# Patient Record
Sex: Female | Born: 1970 | Race: White | Hispanic: No | Marital: Married | Smoking: Never smoker
Health system: Southern US, Community
[De-identification: ages and names within clinical notes are randomized; demographics above are authoritative.]

## PROBLEM LIST (undated history)

## (undated) DIAGNOSIS — E785 Hyperlipidemia, unspecified: Secondary | ICD-10-CM

## (undated) DIAGNOSIS — M25512 Pain in left shoulder: Secondary | ICD-10-CM

## (undated) DIAGNOSIS — L709 Acne, unspecified: Secondary | ICD-10-CM

## (undated) DIAGNOSIS — R0982 Postnasal drip: Secondary | ICD-10-CM

## (undated) DIAGNOSIS — M6530 Trigger finger, unspecified finger: Secondary | ICD-10-CM

## (undated) DIAGNOSIS — E11319 Type 2 diabetes mellitus with unspecified diabetic retinopathy without macular edema: Secondary | ICD-10-CM

## (undated) DIAGNOSIS — D649 Anemia, unspecified: Secondary | ICD-10-CM

## (undated) DIAGNOSIS — N979 Female infertility, unspecified: Secondary | ICD-10-CM

## (undated) DIAGNOSIS — R0981 Nasal congestion: Secondary | ICD-10-CM

## (undated) DIAGNOSIS — F329 Major depressive disorder, single episode, unspecified: Secondary | ICD-10-CM

## (undated) DIAGNOSIS — I10 Essential (primary) hypertension: Secondary | ICD-10-CM

## (undated) DIAGNOSIS — M25511 Pain in right shoulder: Secondary | ICD-10-CM

## (undated) DIAGNOSIS — K219 Gastro-esophageal reflux disease without esophagitis: Secondary | ICD-10-CM

## (undated) DIAGNOSIS — E109 Type 1 diabetes mellitus without complications: Secondary | ICD-10-CM

## (undated) DIAGNOSIS — R05 Cough: Secondary | ICD-10-CM

## (undated) DIAGNOSIS — E1140 Type 2 diabetes mellitus with diabetic neuropathy, unspecified: Secondary | ICD-10-CM

## (undated) DIAGNOSIS — F32A Depression, unspecified: Secondary | ICD-10-CM

## (undated) HISTORY — DX: Type 2 diabetes mellitus with unspecified diabetic retinopathy without macular edema: E11.319

## (undated) HISTORY — DX: Hyperlipidemia, unspecified: E78.5

## (undated) HISTORY — DX: Female infertility, unspecified: N97.9

## (undated) HISTORY — DX: Type 1 diabetes mellitus without complications: E10.9

## (undated) HISTORY — DX: Pain in right shoulder: M25.511

## (undated) HISTORY — DX: Gastro-esophageal reflux disease without esophagitis: K21.9

## (undated) HISTORY — DX: Major depressive disorder, single episode, unspecified: F32.9

## (undated) HISTORY — DX: Essential (primary) hypertension: I10

## (undated) HISTORY — DX: Pain in left shoulder: M25.512

## (undated) HISTORY — DX: Depression, unspecified: F32.A

---

## 2013-06-29 HISTORY — PX: SHOULDER SURGERY: SHX246

## 2014-10-01 ENCOUNTER — Other Ambulatory Visit: Payer: Self-pay | Admitting: Internal Medicine

## 2014-10-01 DIAGNOSIS — E049 Nontoxic goiter, unspecified: Secondary | ICD-10-CM

## 2014-10-06 ENCOUNTER — Other Ambulatory Visit: Payer: Self-pay

## 2014-10-07 ENCOUNTER — Ambulatory Visit
Admission: RE | Admit: 2014-10-07 | Discharge: 2014-10-07 | Disposition: A | Discharge status: Home / Self Care | Payer: BC Managed Care – PPO | Source: Ambulatory Visit | Attending: Internal Medicine | Admitting: Internal Medicine

## 2014-10-07 DIAGNOSIS — E049 Nontoxic goiter, unspecified: Secondary | ICD-10-CM

## 2015-04-08 LAB — HEMOGLOBIN A1C: Hemoglobin A1C: 9.5

## 2015-07-22 LAB — CBC AND DIFFERENTIAL
HEMATOCRIT: 37 % (ref 36–46)
HEMOGLOBIN: 12.1 g/dL (ref 12.0–16.0)
Platelets: 436 10*3/uL — AB (ref 150–399)
WBC: 8.2 10^3/mL

## 2015-07-22 LAB — BASIC METABOLIC PANEL
BUN: 11 mg/dL (ref 4–21)
Creatinine: 0.9 mg/dL (ref <=1.1)
Glucose: 286 mg/dL
Potassium: 3.8 mmol/L (ref 3.4–5.3)
Sodium: 135 mmol/L — AB (ref 137–147)

## 2015-08-16 ENCOUNTER — Encounter: Payer: Self-pay | Admitting: Endocrinology

## 2015-08-16 ENCOUNTER — Ambulatory Visit (INDEPENDENT_AMBULATORY_CARE_PROVIDER_SITE_OTHER): Payer: BLUE CROSS/BLUE SHIELD | Admitting: Endocrinology

## 2015-08-16 ENCOUNTER — Encounter: Payer: BLUE CROSS/BLUE SHIELD | Attending: Endocrinology | Admitting: Nutrition

## 2015-08-16 VITALS — BP 132/86 | HR 87 | Temp 98.3°F | Ht 69.0 in | Wt 209.0 lb

## 2015-08-16 DIAGNOSIS — E109 Type 1 diabetes mellitus without complications: Secondary | ICD-10-CM | POA: Insufficient documentation

## 2015-08-16 DIAGNOSIS — Z0279 Encounter for issue of other medical certificate: Secondary | ICD-10-CM

## 2015-08-16 DIAGNOSIS — K219 Gastro-esophageal reflux disease without esophagitis: Secondary | ICD-10-CM

## 2015-08-16 DIAGNOSIS — Z713 Dietary counseling and surveillance: Secondary | ICD-10-CM | POA: Diagnosis not present

## 2015-08-16 DIAGNOSIS — E785 Hyperlipidemia, unspecified: Secondary | ICD-10-CM

## 2015-08-16 DIAGNOSIS — E10339 Type 1 diabetes mellitus with moderate nonproliferative diabetic retinopathy without macular edema: Secondary | ICD-10-CM | POA: Diagnosis not present

## 2015-08-16 DIAGNOSIS — E119 Type 2 diabetes mellitus without complications: Secondary | ICD-10-CM | POA: Insufficient documentation

## 2015-08-16 DIAGNOSIS — D509 Iron deficiency anemia, unspecified: Secondary | ICD-10-CM | POA: Diagnosis not present

## 2015-08-16 NOTE — Progress Notes (Signed)
Pt is on a Medtronic 530G insulin pump.  She is counting carbs and using the bolus wizard as directed.  She did not know how to adjust/change the basal rates, nor change the I/C ratio for meal time insulin.  She was shown how to do this, and she did this with some assistance from me.   She reports that she is not using the CGM, because the "alarms were going off all the time", and the readings were way off from the blood sugars readings".   She is having problems with iron deficiency anemia, and is tired all the time.  She says that once she gets this problem solved, she will be better able to deal with the CGM.   We discussed how the changes that were made to the pump( Basal rate changed from 1.2 to 1.0u/hr, and the I/C ratio changed from 1u/8 to 1u/7 ) will affect her blood sugars, she had no questions.  She was told to call me in 1 week with blood sugar readings.  She did not want any more information on carb counting, saying that she has a good understanding of this.   She had no final questions.

## 2015-08-16 NOTE — Progress Notes (Deleted)
Patient is on a Medtronic 530 g insulin pump.  She is counting carbs,  But does not know how to make changes in her basal rate and carb amounts.  She was shown how to do this and decreased her basal rate from 1.2 to 1.0u/hr.  She increased her I/C ratio from 1u/8 to 1u/7.  We reviewed what these changes mean, and how this will affect her blood sugar levels.  She will call me in one week with blood sugar readings.

## 2015-08-16 NOTE — Progress Notes (Signed)
Subjective:    Patient ID: Cynthia Moody, female    DOB: 24-Dec-1971, 44 y.o.   MRN: [id number]  HPI pt states DM was dx'ed in 1998; she has mild neuropathy of the lower extremities, and associated retinopathy; she has been on insulin since 2000; pt says her diet and exercise are not very good; she has never had GDM, pancreatitis, or severe hypoglycemia.  She has had DKA once, in 2012.  She takes these settings.  basal rate varies between 1.1 and 1.2 units/hr. mealtime bolus of 1 unit/8 grams carbohydrate continue correction bolus (which some people call "sensitivity," or "insulin sensitivity ratio," or just "isr") of 1 unit for each 35 by which glucose exceeds 120. She had a continuous glucose monitor, but stopped, because she did not like it.  She averages approx 65 units total per day.  She seldom has hypoglycemia, and these episodes are mild.  It most often happens in the early hrs of the am.  Meter is downloaded today, and reviewed.  It is scanned into the record.  She checks cbg only approx twice per day. No past medical history on file.  No past surgical history on file.  Social History   Social History  . Marital Status: Married    Spouse Name: N/A  . Number of Children: N/A  . Years of Education: N/A   Occupational History  . Not on file.   Social History Main Topics  . Smoking status: Never Smoker   . Smokeless tobacco: Not on file  . Alcohol Use: 0.0 oz/week    0 Standard drinks or equivalent per week  . Drug Use: Not on file  . Sexual Activity: Not on file   Other Topics Concern  . Not on file   Social History Narrative  . No narrative on file    No current outpatient prescriptions on file prior to visit.   No current facility-administered medications on file prior to visit.    Allergies  Allergen Reactions  . Lipitor [Atorvastatin] Swelling    Family History  Problem Relation Age of Onset  . Diabetes Mother     BP 132/86 mmHg  Pulse 87  Temp(Src)  98.3 F (36.8 C) (Oral)  Ht 5\' 9"  (1.753 m)  Wt 209 lb (94.802 kg)  BMI 30.85 kg/m2  SpO2 98%  LMP 08/07/2015  Review of Systems denies weight loss, headache, chest pain, sob, n/v, urinary frequency, muscle cramps, excessive diaphoresis, depression, rhinorrhea, and easy bruising.  she has intermittent blurry vision, cold intolerance, and fatigue.      Objective:   Physical Exam VS: see vs page GEN: no distress HEAD: head: no deformity eyes: no periorbital swelling, no proptosis external nose and ears are normal mouth: no lesion seen NECK: supple, thyroid is not enlarged CHEST WALL: no deformity LUNGS:  Clear to auscultation. CV: reg rate and rhythm, no murmur ABD: abdomen is soft, nontender.  no hepatosplenomegaly.  not distended.  no hernia MUSCULOSKELETAL: muscle bulk and strength are grossly normal.  no obvious joint swelling.  gait is normal and steady EXTEMITIES: no deformity.  no ulcer on the feet.  feet are of normal color and temp.  no edema PULSES: dorsalis pedis intact bilat.  no carotid bruit NEURO:  cn 2-12 grossly intact.   readily moves all 4's.  sensation is intact to touch on the feet SKIN:  Normal texture and temperature.  No rash or suspicious lesion is visible.   NODES:  None palpable at the  neck PSYCH: alert, well-oriented.  Does not appear anxious nor depressed.   outside test results are reviewed: A1c=9.5%  i have reviewed outside records: Pt was seen with sxs of fatigue, and was noted to have anemia.   Radiol: thyroid US (2015): Small 5 mm hypoechoic nodule in the right lower pole.      Assessment & Plan:  DM: severe exacerbation Fatigue: new to me.  Could be due to anemia or elevated glucose.  We'll follow. Thyroid nodule: nonpalpable. She just needs annual physical exam of the thyroid.  Patient is advised the following: Patient Instructions  good diet and exercise significantly improve the control of your diabetes.  please let me know if you  wish to be referred to a dietician.  high blood sugar is very risky to your health.  you should see an eye doctor and dentist every year.  It is very important to get all recommended vaccinations.  controlling your blood pressure and cholesterol drastically reduces the damage diabetes does to your body.  Those who smoke should quit.  please discuss these with your doctor.  At our office, we are fortunate to have two specialists who are happy to help you:   Leonia Reader, RN, CDE, is a diabetes educator and pump trainer.  She is here on Monday mornings, and all day Tuesday and Wednesday.  She is can help you with low blood sugar avoidance and treatment, injecting insulin, sick day management, and others.   Antonieta Iba, RD is our dietician.  She is here all day Thursday and Friday.  She can advise you about a healthy diet.  She can also help you about a variety of special diabetes situations, such as shift work, Actor, gluten-free, diet for kidney patients, traveling with diabetes, and help for those who need to gain weight.   check your blood sugar 5 times a day: before the 3 meals, at bedtime, and as needed.  also check if you have symptoms of your blood sugar being too high or too low.  please keep a record of the readings and bring it to your next appointment here.  You can write it on any piece of paper.  please call us sooner if your blood sugar goes below 70, or if you have a lot of readings over 200. For now: Reduce your basal rate to 1 unit/hr, 24 hrs per day.  increase mealtime bolus to 1 unit/7 grams carbohydrate continue correction bolus (which some people call "sensitivity," or "insulin sensitivity ratio," or just "isr") of 1 unit for each 35 by which your glucose exceeds 120. Please come back for a follow-up appointment in 2 months.

## 2015-08-16 NOTE — Patient Instructions (Addendum)
good diet and exercise significantly improve the control of your diabetes.  please let me know if you wish to be referred to a dietician.  high blood sugar is very risky to your health.  you should see an eye doctor and dentist every year.  It is very important to get all recommended vaccinations.  controlling your blood pressure and cholesterol drastically reduces the damage diabetes does to your body.  Those who smoke should quit.  please discuss these with your doctor.  At our office, we are fortunate to have two specialists who are happy to help you:   Leonia Reader, RN, CDE, is a diabetes educator and pump trainer.  She is here on Monday mornings, and all day Tuesday and Wednesday.  She is can help you with low blood sugar avoidance and treatment, injecting insulin, sick day management, and others.   Antonieta Iba, RD is our dietician.  She is here all day Thursday and Friday.  She can advise you about a healthy diet.  She can also help you about a variety of special diabetes situations, such as shift work, Actor, gluten-free, diet for kidney patients, traveling with diabetes, and help for those who need to gain weight.   check your blood sugar 5 times a day: before the 3 meals, at bedtime, and as needed.  also check if you have symptoms of your blood sugar being too high or too low.  please keep a record of the readings and bring it to your next appointment here.  You can write it on any piece of paper.  please call us sooner if your blood sugar goes below 70, or if you have a lot of readings over 200. For now: Reduce your basal rate to 1 unit/hr, 24 hrs per day.  increase mealtime bolus to 1 unit/7 grams carbohydrate continue correction bolus (which some people call "sensitivity," or "insulin sensitivity ratio," or just "isr") of 1 unit for each 35 by which your glucose exceeds 120. Please come back for a follow-up appointment in 2 months.

## 2015-08-17 ENCOUNTER — Telehealth: Payer: Self-pay | Admitting: Hematology

## 2015-08-17 NOTE — Telephone Encounter (Signed)
Called pt and left message on 08/16/15.    Dx: Low iron levels Referring: Dr. Kelton Pillar

## 2015-08-25 ENCOUNTER — Ambulatory Visit: Payer: BLUE CROSS/BLUE SHIELD | Admitting: Dietician

## 2015-08-25 ENCOUNTER — Other Ambulatory Visit (HOSPITAL_BASED_OUTPATIENT_CLINIC_OR_DEPARTMENT_OTHER): Payer: BLUE CROSS/BLUE SHIELD

## 2015-08-25 ENCOUNTER — Other Ambulatory Visit: Payer: Self-pay | Admitting: Hematology

## 2015-08-25 ENCOUNTER — Telehealth: Payer: Self-pay | Admitting: Hematology

## 2015-08-25 ENCOUNTER — Encounter: Payer: Self-pay | Admitting: Hematology

## 2015-08-25 ENCOUNTER — Other Ambulatory Visit: Payer: BLUE CROSS/BLUE SHIELD

## 2015-08-25 ENCOUNTER — Ambulatory Visit (HOSPITAL_BASED_OUTPATIENT_CLINIC_OR_DEPARTMENT_OTHER): Payer: BLUE CROSS/BLUE SHIELD | Admitting: Hematology

## 2015-08-25 VITALS — BP 132/78 | HR 95 | Temp 98.5°F | Resp 18 | Ht 69.0 in | Wt 206.8 lb

## 2015-08-25 DIAGNOSIS — K219 Gastro-esophageal reflux disease without esophagitis: Secondary | ICD-10-CM | POA: Diagnosis not present

## 2015-08-25 DIAGNOSIS — D539 Nutritional anemia, unspecified: Secondary | ICD-10-CM

## 2015-08-25 DIAGNOSIS — D509 Iron deficiency anemia, unspecified: Secondary | ICD-10-CM

## 2015-08-25 DIAGNOSIS — E010 Iodine-deficiency related diffuse (endemic) goiter: Secondary | ICD-10-CM

## 2015-08-25 DIAGNOSIS — R1312 Dysphagia, oropharyngeal phase: Secondary | ICD-10-CM

## 2015-08-25 LAB — CBC & DIFF AND RETIC
BASO%: 0.3 % (ref 0.0–2.0)
BASOS ABS: 0 10*3/uL (ref 0.0–0.1)
EOS ABS: 0.1 10*3/uL (ref 0.0–0.5)
EOS%: 1.5 % (ref 0.0–7.0)
HEMATOCRIT: 37.3 % (ref 34.8–46.6)
HEMOGLOBIN: 12.3 g/dL (ref 11.6–15.9)
IMMATURE RETIC FRACT: 4 % (ref 1.60–10.00)
LYMPH%: 30.8 % (ref 14.0–49.7)
MCH: 27 pg (ref 25.1–34.0)
MCHC: 33 g/dL (ref 31.5–36.0)
MCV: 81.8 fL (ref 79.5–101.0)
MONO#: 0.6 10*3/uL (ref 0.1–0.9)
MONO%: 7.9 % (ref 0.0–14.0)
NEUT%: 59.5 % (ref 38.4–76.8)
NEUTROS ABS: 4.4 10*3/uL (ref 1.5–6.5)
Platelets: 393 10*3/uL (ref 145–400)
RBC: 4.56 10*6/uL (ref 3.70–5.45)
RDW: 14.8 % — AB (ref 11.2–14.5)
RETIC %: 1.32 % (ref 0.70–2.10)
Retic Ct Abs: 60.19 10*3/uL (ref 33.70–90.70)
WBC: 7.3 10*3/uL (ref 3.9–10.3)
lymph#: 2.3 10*3/uL (ref 0.9–3.3)

## 2015-08-25 LAB — COMPREHENSIVE METABOLIC PANEL (CC13)
ALBUMIN: 3.9 g/dL (ref 3.5–5.0)
ALK PHOS: 84 U/L (ref 40–150)
ALT: 11 U/L (ref 0–55)
AST: 14 U/L (ref 5–34)
Anion Gap: 8 mEq/L (ref 3–11)
BILIRUBIN TOTAL: 0.32 mg/dL (ref 0.20–1.20)
BUN: 10.9 mg/dL (ref 7.0–26.0)
CALCIUM: 9.7 mg/dL (ref 8.4–10.4)
CO2: 27 mEq/L (ref 22–29)
Chloride: 105 mEq/L (ref 98–109)
Creatinine: 0.9 mg/dL (ref 0.6–1.1)
EGFR: 76 mL/min/{1.73_m2} — ABNORMAL LOW (ref >90)
GLUCOSE: 135 mg/dL (ref 70–140)
Potassium: 4.1 mEq/L (ref 3.5–5.1)
SODIUM: 140 meq/L (ref 136–145)
TOTAL PROTEIN: 7.2 g/dL (ref 6.4–8.3)

## 2015-08-25 LAB — LACTATE DEHYDROGENASE (CC13): LDH: 179 U/L (ref 125–245)

## 2015-08-25 LAB — CHCC SMEAR

## 2015-08-25 NOTE — Telephone Encounter (Signed)
per per tohave labs drwn prior to appt-cld & spoke to pt nd adv to be here @ 2:15 to have labs drawn-pt understood

## 2015-08-26 LAB — IRON AND TIBC CHCC
%SAT: 7 % — AB (ref 21–57)
IRON: 27 ug/dL — AB (ref 41–142)
TIBC: 369 ug/dL (ref 236–444)
UIBC: 342 ug/dL (ref 120–384)

## 2015-08-26 LAB — VITAMIN B12: Vitamin B-12: 546 pg/mL (ref 211–911)

## 2015-08-26 LAB — HAPTOGLOBIN: Haptoglobin: 227 mg/dL — ABNORMAL HIGH (ref 43–212)

## 2015-08-26 LAB — FERRITIN CHCC: FERRITIN: 8 ng/mL — AB (ref 9–269)

## 2015-08-30 ENCOUNTER — Telehealth: Payer: Self-pay | Admitting: *Deleted

## 2015-08-30 ENCOUNTER — Encounter: Payer: Self-pay | Admitting: Hematology

## 2015-08-30 NOTE — Telephone Encounter (Signed)
per pof to sch pt appt-gave pt copy of avs-sent MW email per 2nd pof to sch fera-will all pt once reply

## 2015-08-30 NOTE — Progress Notes (Signed)
Cynthia Moody Kitchen    HEMATOLOGY/ONCOLOGY CONSULTATION NOTE  Date of Service: 08/30/2015  Patient Care Team: Lottie Dawson, MD as PCP - General (Internal Medicine)  CHIEF COMPLAINTS/PURPOSE OF CONSULTATION:  Fatigue/management of iron deficiency  HISTORY OF PRESENTING ILLNESS:   Cynthia Moody is a wonderful 44 y.o. female who has been referred to [place] by Dr .Kelton Pillar, Herschell Dimes, MD for evaluation and management of iron deficiency.  Ayrianna has a history of late onset diabetes mellitus type 1 diagnosis in 1998 which she is managing with an insulin pump, history of dyslipidemia, depression which is well-controlled.  She notes that she is previously had a history of iron deficiency anemia that was evaluated and treated at the [facility] in 2011. She notes that she had an EGD and a colonoscopy which was unrevealing and they thought about it but decided not to do a capsule endoscopy. She did not tolerate oral iron due to significant GI distress and was treated with 2-3 doses of IV iron. She notes that the last time she got IV iron was a few years ago.  She notes no overt evidence of GI bleeding, epistaxis. Has been having somewhat heavy but regular periods. There is last about 5 days and she does pass some clots and a 2 and a 3.  She notes that she's been having increasing fatigue and restless legs. No pica symptoms. Since she could not tolerate oral iron she was referred by her primary care physician to consider IV iron treatment if indicated. Patient reports that she has been having to drag herself to work and the fatigue is significantly affecting her quality of life. She reports that she cannot really tolerated the oral ferrous sulfate or other iron preparations even once a day.  Her labs today showed a hemoglobin of 12.3 with an MCV of 81 WBC count of 7.3 and platelets of 393. CMP was within normal limits. Ferritin was significantly reduced at 8 with an iron saturation of 7%.  Clearly suggesting iron deficiency. B12 was normal at 546. Normal bilirubin, LDH and haptoglobin not suggesting any signs of hemolysis.  Patient has been on chronic PPI treatment for several years and was explained at this could be a risk factor for both iron and B12 deficiency. She reports that she has significant GERD and cannot stop using PPIs at this time.  Bowel habits have been regular. No overt signs of an intolerance or malabsorption syndrome.  MEDICAL HISTORY:  Past Medical History  Diagnosis Date  . Diabetes mellitus type 1 1998    Currently being treated with an insulin pump  . Dyslipidemia   . Shoulder pain, bilateral     Patient reports this was related to bone spurs that significantly limited her range of motion . Left shoulder is better after surgery.  Cynthia Moody Kitchen Heart murmur   . Abducent nerve palsy, left eye     Thought to be related to her diabetes  . GERD (gastroesophageal reflux disease)   . Depression   . Diabetic retinopathy     SURGICAL HISTORY: Past Surgical History  Procedure Laterality Date  . Cesarean section  1996  . Shoulder surgery Left     For bone spurs    SOCIAL HISTORY: Social History   Social History  . Marital Status: Married    Spouse Name: N/A  . Number of Children: N/A  . Years of Education: N/A   Occupational History  . Not on file.   Social History Main Topics  .  Smoking status: Never Smoker   . Smokeless tobacco: Not on file  . Alcohol Use: 0.0 oz/week    0 Standard drinks or equivalent per week  . Drug Use: Not on file  . Sexual Activity: Yes   Other Topics Concern  . Not on file   Social History Narrative    FAMILY HISTORY: Family History  Problem Relation Age of Onset  . Diabetes Mother     ALLERGIES:  is allergic to lipitor.  MEDICATIONS:  Current Outpatient Prescriptions  Medication Sig Dispense Refill  . acetaminophen (TYLENOL) 650 MG suppository Place 650 mg rectally every 4 (four) hours as needed.    .  cholecalciferol (VITAMIN D) 1000 UNITS tablet Take 1,000 Units by mouth daily.    Cynthia Moody Kitchen glucosamine-chondroitin 500-400 MG tablet Take 1 tablet by mouth 3 (three) times daily.    . insulin aspart (NOVOLOG) 100 UNIT/ML injection Inject into the skin. 1 unit per 8 grams of carbs    . Insulin Infusion Pump Supplies (PARADIGM QUICK-SET 32" 6MM) MISC 1 Device by Does not apply route every 3 (three) days.    . Insulin Infusion Pump Supplies (PARADIGM RESERVOIR 3ML) MISC 1 Device by Does not apply route every 3 (three) days.    Cynthia Moody Kitchen omeprazole (PRILOSEC) 20 MG capsule Take 20 mg by mouth daily.    . simvastatin (ZOCOR) 20 MG tablet Take 20 mg by mouth daily.    . traMADol (ULTRAM) 50 MG tablet Take by mouth every 6 (six) hours as needed.     No current facility-administered medications for this visit.    REVIEW OF SYSTEMS:    10 Point review of Systems was done is negative except as noted above.  PHYSICAL EXAMINATION: ECOG PERFORMANCE STATUS: 1 - Symptomatic but completely ambulatory  . Filed Vitals:   08/25/15 1511  Height: 5\' 9"  (1.753 m)  Weight: 206 lb 12.8 oz (93.804 kg)   Filed Weights   08/25/15 1511  Weight: 206 lb 12.8 oz (93.804 kg)   .Body mass index is 30.53 kg/(m^2).  GENERAL:alert, in no acute distress and comfortable SKIN: skin color, texture, turgor are normal, no rashes or significant lesions EYES: normal, conjunctiva are pink and non-injected, sclera clear OROPHARYNX:no exudate, no erythema and lips, buccal mucosa, and tongue normal  NECK: supple, no JVD, thyroid normal size, non-tender, without nodularity LYMPH:  no palpable lymphadenopathy in the cervical, axillary or inguinal LUNGS: clear to auscultation with normal respiratory effort HEART: regular rate & rhythm,  no murmurs and no lower extremity edema ABDOMEN: abdomen soft, non-tender, normoactive bowel sounds  Musculoskeletal: no cyanosis of digits and no clubbing  PSYCH: alert & oriented x 3 with fluent  speech NEURO: no focal motor/sensory deficits  LABORATORY DATA:  I have reviewed the data as listed  . CBC Latest Ref Rng 08/25/2015  WBC 3.9 - 10.3 10e3/uL 7.3  Hemoglobin 11.6 - 15.9 g/dL 12.3  Hematocrit 34.8 - 46.6 % 37.3  Platelets 145 - 400 10e3/uL 393    . CMP Latest Ref Rng 08/25/2015  Glucose 70 - 140 mg/dl 135  BUN 7.0 - 26.0 mg/dL 10.9  Creatinine 0.6 - 1.1 mg/dL 0.9  Sodium 136 - 145 mEq/L 140  Potassium 3.5 - 5.1 mEq/L 4.1  CO2 22 - 29 mEq/L 27  Calcium 8.4 - 10.4 mg/dL 9.7  Total Protein 6.4 - 8.3 g/dL 7.2  Total Bilirubin 0.20 - 1.20 mg/dL 0.32  Alkaline Phos 40 - 150 U/L 84  AST 5 - 34 U/L  14  ALT 0 - 55 U/L 11   . Lab Results  Component Value Date   IRON 27* 08/25/2015   TIBC 369 08/25/2015   IRONPCTSAT 7* 08/25/2015   (Iron and TIBC)  Lab Results  Component Value Date   FERRITIN 8* 08/25/2015    . Lab Results  Component Value Date   LDH 179 08/25/2015   Peripheral blood smear (08/25/2015) personally reviewed by me.  RBCs showing changes of microcytosis with the population for a tendency to hypochromia. Normal platelets but no platelet clumping. No overt dysplastic changes. No blasts.     RADIOGRAPHIC STUDIES: I have personally reviewed the radiological images as listed and agreed with the findings in the report. No results found.  ASSESSMENT & PLAN:   44 year old Caucasian female with increasing fatigue noted to have   #1 Severe iron deficiency with minimal anemia. Ferritin 8, iron saturation 7%. Patient appears to have significant symptomatic fatigue. Has had extensive GI workup to try to determine etiology of iron deficiency at [facility] that was unrevealing. Has previously received and tolerated IV Feraheme. She notes that she is unable to tolerate any oral iron due to significant GI distress and is keen take replace her Iron IV. Chronic PPI therapy is certainly an additional risk factor for developing iron  deficiency. Plan  -Consented the patient and will proceed to replace her iron with IV Feraheme 510 mg IV every weekly 2 doses -Patient will take cetirizine and famotidine tablets prior to each infusion of IV iron to reduce the risk of allergic reactions. -She will return to care in 8 weeks for repeat CBC, ferritin, iron profile with Dr. Irene Limbo. -Primary care physician to consider GYN referral to evaluate heavier menstrual bleeding. -Consider additional lifestyle modifications for GERD to be able to wean off PPI therapy if possible.  All of the patient's questions were answered in detail to her apparent satisfaction. She understands the plan and is agreeable to proceeding with this. She has all the contact information for our office in case any other questions or concerns arise.  I spent 30 minutes counseling the patient face to face. The total time spent in the appointment was 40 minutes and more than 50% was on counseling and direct patient cares.    Sullivan Lone MD [place] AAHIVMS [facility] Hematology/Oncology Physician [facility]  (Office):       [phone] (Work cell):  [phone] (Fax):           [phone]  08/30/2015 8:38 AM

## 2015-08-30 NOTE — Telephone Encounter (Signed)
Per staff message and POF I have scheduled appts. Advised scheduler of appts. JMW  

## 2015-09-02 ENCOUNTER — Encounter: Payer: Self-pay | Admitting: Hematology

## 2015-09-02 ENCOUNTER — Ambulatory Visit (HOSPITAL_COMMUNITY)
Admission: RE | Admit: 2015-09-02 | Discharge: 2015-09-02 | Disposition: A | Discharge status: Home / Self Care | Payer: BLUE CROSS/BLUE SHIELD | Source: Ambulatory Visit | Attending: Hematology | Admitting: Hematology

## 2015-09-02 DIAGNOSIS — R1312 Dysphagia, oropharyngeal phase: Secondary | ICD-10-CM | POA: Insufficient documentation

## 2015-09-02 DIAGNOSIS — E049 Nontoxic goiter, unspecified: Secondary | ICD-10-CM | POA: Diagnosis present

## 2015-09-02 DIAGNOSIS — E010 Iodine-deficiency related diffuse (endemic) goiter: Secondary | ICD-10-CM

## 2015-09-02 MED ORDER — IOHEXOL 300 MG/ML  SOLN
100.0000 mL | Freq: Once | INTRAMUSCULAR | Status: AC | PRN
  Administered 2015-09-02: 100 mL via INTRAVENOUS

## 2015-09-02 NOTE — Progress Notes (Signed)
Spoke to pt introducing myself as her FA and informed her of my services.  I gave her my contact information to contact me if she needs further assistance.  She thanked me but right now she doesn't have any financial needs.

## 2015-09-05 ENCOUNTER — Telehealth: Payer: Self-pay | Admitting: Endocrinology

## 2015-09-05 MED ORDER — INSULIN ASPART 100 UNIT/ML ~~LOC~~ SOLN: SUBCUTANEOUS | Status: DC

## 2015-09-05 NOTE — Telephone Encounter (Signed)
Pt switched pharmacy to [facility] on [address], please send all refills and meds there

## 2015-09-05 NOTE — Telephone Encounter (Signed)
Pharmacy changed and insulin sent.

## 2015-09-06 ENCOUNTER — Telehealth: Payer: Self-pay | Admitting: Hematology

## 2015-09-06 NOTE — Telephone Encounter (Signed)
cld pt and adv of time & date of fera-pt understood

## 2015-09-07 ENCOUNTER — Ambulatory Visit (HOSPITAL_BASED_OUTPATIENT_CLINIC_OR_DEPARTMENT_OTHER): Payer: BLUE CROSS/BLUE SHIELD

## 2015-09-07 VITALS — BP 120/68 | HR 67 | Temp 98.2°F | Resp 18

## 2015-09-07 DIAGNOSIS — D509 Iron deficiency anemia, unspecified: Secondary | ICD-10-CM | POA: Diagnosis not present

## 2015-09-07 MED ORDER — SODIUM CHLORIDE 0.9 % IV SOLN
Freq: Once | INTRAVENOUS | Status: AC
  Administered 2015-09-07: 12:00:00 via INTRAVENOUS

## 2015-09-07 MED ORDER — SODIUM CHLORIDE 0.9 % IV SOLN
510.0000 mg | Freq: Once | INTRAVENOUS | Status: AC
  Administered 2015-09-07: 510 mg via INTRAVENOUS
  Filled 2015-09-07: qty 17

## 2015-09-07 NOTE — Patient Instructions (Signed)

## 2015-09-07 NOTE — Progress Notes (Signed)
Pt tolerated Feraheme Infusion well. Pt monitored for 30 minutes post infusion, snack and drinks offered, pt declined. Pt and VS stable at time of discharge.

## 2015-09-09 ENCOUNTER — Encounter: Payer: Self-pay | Admitting: Dietician

## 2015-09-09 ENCOUNTER — Ambulatory Visit: Payer: BLUE CROSS/BLUE SHIELD | Admitting: Dietician

## 2015-09-09 ENCOUNTER — Encounter: Payer: BLUE CROSS/BLUE SHIELD | Attending: Endocrinology | Admitting: Dietician

## 2015-09-09 ENCOUNTER — Other Ambulatory Visit: Payer: Self-pay

## 2015-09-09 VITALS — Ht 66.5 in | Wt 206.0 lb

## 2015-09-09 DIAGNOSIS — Z713 Dietary counseling and surveillance: Secondary | ICD-10-CM | POA: Diagnosis not present

## 2015-09-09 DIAGNOSIS — E109 Type 1 diabetes mellitus without complications: Secondary | ICD-10-CM | POA: Diagnosis present

## 2015-09-09 NOTE — Patient Instructions (Signed)
When your blood sugar is high, choose a low carb snack or give insulin Choose low fat options more often. Increase non starchy vegetable intake. Aim for 3-4 Carb Choices per meal (45-60 grams) +/- 1 either way  Aim for 0-1 Carbs per snack if hungry  Include protein in moderation with your meals and snacks Consider reading food labels for Total Carbohydrate and Fat Grams of foods Consider  increasing your activity level by walking for 30 minutes daily as tolerated Consider checking BG at alternate times per day as directed by MD  Consider taking medication insulin as directed by MD

## 2015-09-09 NOTE — Progress Notes (Signed)
Diabetes Self-Management Education  Visit Type: First/Initial  Appt. Start Time: 0915 Appt. End Time: 0086  09/09/2015  Cynthia Moody, identified by name and date of birth, is a 44 y.o. female with a diagnosis of Diabetes: Type 1.   Patient is here alone.  She lives with her husband and 38 year old son.  Her husband has type 2 diabetes. They moved here from [place] about 1 1/2 years ago and she has started to establish care in [place].  She works for [employer].  Her HgbA1C was 9.6% in April 2016 which was down from 13-15%  She was stretching her insulin and pump supplies when she first moved here until her insurance became active.  She has had Type 1 diabetes since age 4 and an insulin pump for the past 3 years. She states that she got her HgbA1C down to 6.9% soon after getting the pump. UBW [phone] for the past 1 1/2 years and 209 lbs today.  160 lbs pre pregnancy.  She is 5'61/2" with large bone structure.  Hx includes dyslipidemia, GERD, Vitamin D deficiency, and anemia requiring a recent iron infusion.  She has retinopathy per chart and has not established an opthalmologist since moving here.  She had eye surgery in the past.    She knows how to count carbohydrates but does not always remember to take her insulin.  She downloaded her pump and information left for Leonia Reader, RN, CDE who will see her in 2 weeks.  CBG's range from 66-280 and noticed lower reading in the past week since starting to walk.  She has had about 5 low blood sugar events in the past 2 weeks since insulin changes.  She is here to get a refresher on carb counting and nutrition for weight loss and health.  Her usual intake consists of 3 meals and 2-3 snacks per day.  She did not eat breakfast before this appointment.  Diet is relatively high fat and snacks consist of 15-45 grams carbs that she may or may not dose for.  We discussed lower carb snacks vs dosing insulin for carbohydrates unless normal blood sugar  prior to exercise.    Breakfast:  Bagel with cream cheese or English muffin with butter and yogurt or toast with egg and fruit or oatmeal with walnuts or cold cereal (shredded wheat or sweet) and 1% milk. Banana if there is one.   Snack:  Nuts or sweet and salty granola bar or fruit cup Lunch:  [employer] OR lean cuisine and fruit Snack:  Vending machine:  Nuts or Nabs or skinny popcorn or occasional candy bar Dinner:  Meat, starch, vegetables Snack:  Cheese puffs or potato chips  ASSESSMENT  Height 5' 6.5" (1.689 m), weight 206 lb (93.441 kg), last menstrual period 08/07/2015. Body mass index is 32.76 kg/(m^2).      Diabetes Self-Management Education - 09/09/15 1108    Patient Education   Nutrition management  Carbohydrate counting;Food label reading, portion sizes and measuring food.;Meal timing in regards to the patients' current diabetes medication.;Information on hints to eating out and maintain blood glucose control.;Meal options for control of blood glucose level and chronic complications.   Physical activity and exercise  Role of exercise on diabetes management, blood pressure control and cardiac health.;Identified with patient nutritional and/or medication changes necessary with exercise.   Monitoring Yearly dilated eye exam   Acute complications Taught treatment of hypoglycemia - the 15 rule.   Chronic complications Retinopathy and reason for  yearly dilated eye exams   Psychosocial adjustment Worked with patient to identify barriers to care and solutions;Role of stress on diabetes;Identified and addressed patients feelings and concerns about diabetes   Personal strategies to promote health Lifestyle issues that need to be addressed for better diabetes care   Individualized Goals (developed by patient)   Nutrition Adjust meds/carbs with exercise as discussed   Physical Activity Exercise 5-7 days per week;30 minutes per day   Medications take my medication as prescribed   Monitoring   test my blood glucose as discussed   Reducing Risk Other (comment);examine blood glucose patterns  get eyes examined.     Outcomes   Expected Outcomes Demonstrated interest in learning. Expect positive outcomes   Future DMSE PRN   Program Status Completed      Individualized Plan for Diabetes Self-Management Training:   Learning Objective:  Patient will have a greater understanding of diabetes self-management. Patient education plan is to attend individual and/or group sessions per assessed needs and concerns.   Plan:   Patient Instructions  When your blood sugar is high, choose a low carb snack or give insulin Choose low fat options more often. Increase non starchy vegetable intake. Aim for 3-4 Carb Choices per meal (45-60 grams) +/- 1 either way  Aim for 0-1 Carbs per snack if hungry  Include protein in moderation with your meals and snacks Consider reading food labels for Total Carbohydrate and Fat Grams of foods Consider  increasing your activity level by walking for 30 minutes daily as tolerated Consider checking BG at alternate times per day as directed by MD  Consider taking medication insulin as directed by MD    Expected Outcomes:  Demonstrated interest in learning. Expect positive outcomes  Education material provided: Meal plan card and Snack sheet, Carb counting fold   If problems or questions, patient to contact team via:  Phone and Email  Future DSME appointment: PRN

## 2015-09-14 ENCOUNTER — Encounter: Payer: Self-pay | Admitting: Endocrinology

## 2015-09-14 ENCOUNTER — Encounter: Payer: Self-pay | Admitting: Hematology

## 2015-09-15 ENCOUNTER — Other Ambulatory Visit: Payer: Self-pay | Admitting: Endocrinology

## 2015-09-15 MED ORDER — INSULIN ASPART 100 UNIT/ML ~~LOC~~ SOLN: SUBCUTANEOUS | Status: DC

## 2015-09-20 ENCOUNTER — Ambulatory Visit: Payer: BLUE CROSS/BLUE SHIELD | Admitting: Nutrition

## 2015-09-27 ENCOUNTER — Encounter: Payer: BLUE CROSS/BLUE SHIELD | Attending: Endocrinology | Admitting: Nutrition

## 2015-09-27 DIAGNOSIS — E109 Type 1 diabetes mellitus without complications: Secondary | ICD-10-CM | POA: Insufficient documentation

## 2015-09-27 DIAGNOSIS — Z713 Dietary counseling and surveillance: Secondary | ICD-10-CM | POA: Diagnosis not present

## 2015-09-27 DIAGNOSIS — E10339 Type 1 diabetes mellitus with moderate nonproliferative diabetic retinopathy without macular edema: Secondary | ICD-10-CM

## 2015-09-27 NOTE — Progress Notes (Signed)
Patient brought in a pump download from last week.  Blood sugars are all variable.  FBSs: 66-320, acL: 75-251,  acS 57-273, HS: 276-313.   Problems identified:   1.  Testing sometimes only once a day--bolusing with no blood sugar readings. 2.  Snacking during the day with no boluses 3.  Not taking more insulin for higher fat meals.    Plan: 1.  Teat before meals and at bedtime 2.  Always bolus when eating something--unless treating a low blood sugar. 3.  Do a correction bolus every night before bed.   4.  Come back in 4 weeks to download pump

## 2015-09-27 NOTE — Patient Instructions (Signed)
1.  Teat before meals and at bedtime 2.  Always bolus when eating something--unless treating a low blood sugar. 3.  Do a correction bolus every night before bed.   4.  Come back in 4 weeks to download pump.

## 2015-10-06 ENCOUNTER — Ambulatory Visit: Payer: BLUE CROSS/BLUE SHIELD | Admitting: Dietician

## 2015-10-17 ENCOUNTER — Ambulatory Visit (INDEPENDENT_AMBULATORY_CARE_PROVIDER_SITE_OTHER): Payer: BLUE CROSS/BLUE SHIELD | Admitting: Endocrinology

## 2015-10-17 VITALS — BP 138/87 | HR 82 | Temp 97.0°F | Ht 69.0 in | Wt 209.0 lb

## 2015-10-17 DIAGNOSIS — E109 Type 1 diabetes mellitus without complications: Secondary | ICD-10-CM | POA: Diagnosis not present

## 2015-10-17 LAB — POCT GLYCOSYLATED HEMOGLOBIN (HGB A1C): HEMOGLOBIN A1C: 8.8

## 2015-10-17 NOTE — Progress Notes (Signed)
Subjective:    Patient ID: Cynthia Moody, female    DOB: 02/10/1971, 44 y.o.   MRN: [id number]  HPI  Pt returns for f/u of diabetes mellitus: DM type: 1 Dx'ed: 7915 Complications: polyneuropathy and retinopathy Therapy: insulin since 2000.  GDM: never DKA: once, in 2012 Severe hypoglycemia: never. Pancreatitis: never. Other: she has a paradigm insulin pump; she had a continuous glucose monitor, but stopped, because she did not like it. Interval history:  She takes these settings.  basal rate of 1 unit/hr. mealtime bolus of 1 unit/7 grams carbohydrate.   continue correction bolus (which some people call "sensitivity," or "insulin sensitivity ratio," or just "isr") of 1 unit for each 35 by which glucose exceeds 120. She averages approx 80 units total per day.  She has hypoglycemia several times per week, and these episodes are mild.  It most often happens in the early hrs of the am.  Meter is downloaded today, and reviewed.  It is scanned into the record.  She checks cbg only approx twice per day, but mostly in am.  It is most commonly low in the fasting state.   Past Medical History  Diagnosis Date  . Diabetes mellitus type 1 1998    Currently being treated with an insulin pump  . Dyslipidemia   . Shoulder pain, bilateral     Patient reports this was related to bone spurs that significantly limited her range of motion . Left shoulder is better after surgery.  [name] Kitchen Heart murmur   . Abducent nerve palsy, left eye     Thought to be related to her diabetes  . GERD (gastroesophageal reflux disease)   . Depression   . Diabetic retinopathy     Past Surgical History  Procedure Laterality Date  . Cesarean section  1996  . Shoulder surgery Left     For bone spurs    Social History   Social History  . Marital Status: Married    Spouse Name: N/A  . Number of Children: N/A  . Years of Education: N/A   Occupational History  . Not on file.   Social History Main Topics  . Smoking  status: Never Smoker   . Smokeless tobacco: Not on file  . Alcohol Use: 0.0 oz/week    0 Standard drinks or equivalent per week  . Drug Use: Not on file  . Sexual Activity: Yes   Other Topics Concern  . Not on file   Social History Narrative    Current Outpatient Prescriptions on File Prior to Visit  Medication Sig Dispense Refill  . acetaminophen (TYLENOL) 650 MG suppository Place 650 mg rectally every 4 (four) hours as needed.    [name] Kitchen b complex vitamins tablet Take 1 tablet by mouth daily.    . cholecalciferol (VITAMIN D) 1000 UNITS tablet Take 1,000 Units by mouth daily.    [name] Kitchen glucosamine-chondroitin 500-400 MG tablet Take 1 tablet by mouth 3 (three) times daily.    . insulin aspart (NOVOLOG) 100 UNIT/ML injection For use in pump, for a total of 75 units per day 80 mL 3  . Insulin Infusion Pump Supplies (PARADIGM QUICK-SET 32" 6MM) MISC 1 Device by Does not apply route every 3 (three) days.    . Insulin Infusion Pump Supplies (PARADIGM RESERVOIR 3ML) MISC 1 Device by Does not apply route every 3 (three) days.    . Multiple Vitamin (MULTIVITAMIN WITH MINERALS) TABS tablet Take 1 tablet by mouth daily.    [name] Kitchen omeprazole (PRILOSEC)  20 MG capsule Take 20 mg by mouth daily.    . simvastatin (ZOCOR) 20 MG tablet Take 20 mg by mouth daily.    . traMADol (ULTRAM) 50 MG tablet Take by mouth every 6 (six) hours as needed.    . vitamin C (ASCORBIC ACID) 500 MG tablet Take 500 mg by mouth daily.     No current facility-administered medications on file prior to visit.    Allergies  Allergen Reactions  . Lipitor [Atorvastatin] Swelling    Family History  Problem Relation Age of Onset  . Diabetes Mother   . Heart attack Mother     Age 27  . Heart attack Father     Age 77    BP 138/87 mmHg  Pulse 82  Temp(Src) 97 F (36.1 C) (Oral)  Ht 5\' 9"  (1.753 m)  Wt 209 lb (94.802 kg)  BMI 30.85 kg/m2  SpO2 97%  Review of Systems Denies LOC    Objective:   Physical Exam VITAL SIGNS:  See  vs page GENERAL: no distress Pulses: dorsalis pedis intact bilat.   MSK: no deformity of the feet CV: no leg edema Skin:  no ulcer on the feet.  normal color and temp on the feet. Neuro: sensation is intact to touch on the feet.     A1c=8.8%.      Assessment & Plan:  DM: she needs increased rx.    Patient is advised the following: Patient Instructions  check your blood sugar 5 times a day: before the 3 meals, at bedtime, and as needed.  also check if you have symptoms of your blood sugar being too high or too low.  please keep a record of the readings and bring it to your next appointment here.  You can write it on any piece of paper.  please call us sooner if your blood sugar goes below 70, or if you have a lot of readings over 200. For now:  Reduce your basal rate to 0.9 unit/hr, 24 hrs per day.   increase mealtime bolus to 1 unit/6 grams carbohydrate.  continue correction bolus (which some people call "sensitivity," or "insulin sensitivity ratio," or just "isr") of 1 unit for each 35 by which your glucose exceeds 120. Please come back for a follow-up appointment in 3 months.

## 2015-10-17 NOTE — Patient Instructions (Addendum)
check your blood sugar 5 times a day: before the 3 meals, at bedtime, and as needed.  also check if you have symptoms of your blood sugar being too high or too low.  please keep a record of the readings and bring it to your next appointment here.  You can write it on any piece of paper.  please call us sooner if your blood sugar goes below 70, or if you have a lot of readings over 200. For now:  Reduce your basal rate to 0.9 unit/hr, 24 hrs per day.   increase mealtime bolus to 1 unit/6 grams carbohydrate.  continue correction bolus (which some people call "sensitivity," or "insulin sensitivity ratio," or just "isr") of 1 unit for each 35 by which your glucose exceeds 120. Please come back for a follow-up appointment in 3 months.

## 2015-10-18 ENCOUNTER — Other Ambulatory Visit (HOSPITAL_COMMUNITY)
Admission: RE | Admit: 2015-10-18 | Discharge: 2015-10-18 | Disposition: A | Discharge status: Home / Self Care | Payer: BLUE CROSS/BLUE SHIELD | Source: Ambulatory Visit | Attending: Internal Medicine | Admitting: Internal Medicine

## 2015-10-18 ENCOUNTER — Other Ambulatory Visit: Payer: Self-pay | Admitting: Internal Medicine

## 2015-10-18 DIAGNOSIS — M6530 Trigger finger, unspecified finger: Secondary | ICD-10-CM | POA: Insufficient documentation

## 2015-10-18 DIAGNOSIS — Z1151 Encounter for screening for human papillomavirus (HPV): Secondary | ICD-10-CM | POA: Insufficient documentation

## 2015-10-18 DIAGNOSIS — Z01419 Encounter for gynecological examination (general) (routine) without abnormal findings: Secondary | ICD-10-CM | POA: Diagnosis present

## 2015-10-18 DIAGNOSIS — E1065 Type 1 diabetes mellitus with hyperglycemia: Secondary | ICD-10-CM | POA: Insufficient documentation

## 2015-10-18 DIAGNOSIS — E611 Iron deficiency: Secondary | ICD-10-CM | POA: Insufficient documentation

## 2015-10-18 DIAGNOSIS — N898 Other specified noninflammatory disorders of vagina: Secondary | ICD-10-CM | POA: Insufficient documentation

## 2015-10-18 LAB — HM DIABETES FOOT EXAM

## 2015-10-20 LAB — CYTOLOGY - PAP

## 2015-10-25 ENCOUNTER — Telehealth: Payer: Self-pay | Admitting: Hematology

## 2015-10-25 ENCOUNTER — Ambulatory Visit (HOSPITAL_BASED_OUTPATIENT_CLINIC_OR_DEPARTMENT_OTHER): Payer: BLUE CROSS/BLUE SHIELD | Admitting: Hematology

## 2015-10-25 ENCOUNTER — Other Ambulatory Visit (HOSPITAL_BASED_OUTPATIENT_CLINIC_OR_DEPARTMENT_OTHER): Payer: BLUE CROSS/BLUE SHIELD

## 2015-10-25 ENCOUNTER — Encounter: Payer: Self-pay | Admitting: Hematology

## 2015-10-25 VITALS — BP 130/69 | HR 77 | Temp 98.3°F | Resp 18 | Ht 69.0 in | Wt 208.1 lb

## 2015-10-25 DIAGNOSIS — D509 Iron deficiency anemia, unspecified: Secondary | ICD-10-CM

## 2015-10-25 DIAGNOSIS — E049 Nontoxic goiter, unspecified: Secondary | ICD-10-CM

## 2015-10-25 DIAGNOSIS — E010 Iodine-deficiency related diffuse (endemic) goiter: Secondary | ICD-10-CM

## 2015-10-25 DIAGNOSIS — IMO0002 Reserved for concepts with insufficient information to code with codable children: Secondary | ICD-10-CM | POA: Insufficient documentation

## 2015-10-25 LAB — IRON AND TIBC CHCC
%SAT: 11 % — ABNORMAL LOW (ref 21–57)
Iron: 30 ug/dL — ABNORMAL LOW (ref 41–142)
TIBC: 285 ug/dL (ref 236–444)
UIBC: 255 ug/dL (ref 120–384)

## 2015-10-25 LAB — CBC & DIFF AND RETIC
BASO%: 0.8 % (ref 0.0–2.0)
Basophils Absolute: 0.1 10*3/uL (ref 0.0–0.1)
EOS%: 1.9 % (ref 0.0–7.0)
Eosinophils Absolute: 0.1 10*3/uL (ref 0.0–0.5)
HCT: 37.3 % (ref 34.8–46.6)
HGB: 12.4 g/dL (ref 11.6–15.9)
IMMATURE RETIC FRACT: 4.6 % (ref 1.60–10.00)
LYMPH#: 1.8 10*3/uL (ref 0.9–3.3)
LYMPH%: 27.5 % (ref 14.0–49.7)
MCH: 28.6 pg (ref 25.1–34.0)
MCHC: 33.2 g/dL (ref 31.5–36.0)
MCV: 85.9 fL (ref 79.5–101.0)
MONO#: 0.4 10*3/uL (ref 0.1–0.9)
MONO%: 6.3 % (ref 0.0–14.0)
NEUT%: 63.5 % (ref 38.4–76.8)
NEUTROS ABS: 4.1 10*3/uL (ref 1.5–6.5)
PLATELETS: 402 10*3/uL — AB (ref 145–400)
RBC: 4.34 10*6/uL (ref 3.70–5.45)
RDW: 14.1 % (ref 11.2–14.5)
Retic %: 1.15 % (ref 0.70–2.10)
Retic Ct Abs: 49.91 10*3/uL (ref 33.70–90.70)
WBC: 6.4 10*3/uL (ref 3.9–10.3)

## 2015-10-25 LAB — TSH CHCC: TSH: 0.365 m(IU)/L (ref 0.308–3.960)

## 2015-10-25 LAB — FERRITIN CHCC: FERRITIN: 28 ng/mL (ref 9–269)

## 2015-10-25 NOTE — Telephone Encounter (Signed)
Gave and printd appt sched and avs fo rpt for March 2017 °

## 2015-10-26 ENCOUNTER — Encounter (INDEPENDENT_AMBULATORY_CARE_PROVIDER_SITE_OTHER): Payer: Self-pay | Admitting: Ophthalmology

## 2015-10-27 ENCOUNTER — Encounter (INDEPENDENT_AMBULATORY_CARE_PROVIDER_SITE_OTHER): Payer: BLUE CROSS/BLUE SHIELD | Admitting: Ophthalmology

## 2015-10-27 DIAGNOSIS — E103313 Type 1 diabetes mellitus with moderate nonproliferative diabetic retinopathy with macular edema, bilateral: Secondary | ICD-10-CM | POA: Diagnosis not present

## 2015-10-27 DIAGNOSIS — E10311 Type 1 diabetes mellitus with unspecified diabetic retinopathy with macular edema: Secondary | ICD-10-CM | POA: Diagnosis not present

## 2015-10-27 DIAGNOSIS — H43813 Vitreous degeneration, bilateral: Secondary | ICD-10-CM | POA: Diagnosis not present

## 2015-10-28 ENCOUNTER — Other Ambulatory Visit: Payer: Self-pay | Admitting: Hematology

## 2015-11-01 ENCOUNTER — Telehealth: Payer: Self-pay | Admitting: *Deleted

## 2015-11-01 ENCOUNTER — Telehealth: Payer: Self-pay | Admitting: Hematology

## 2015-11-01 NOTE — Telephone Encounter (Signed)
sw. pt and advised on 11.11.16 appt.[name] KitchenMarland KitchenMarland KitchenMarland Kitchenpt ok and aware

## 2015-11-01 NOTE — Telephone Encounter (Signed)
Patient informed of feraheme appointment

## 2015-11-01 NOTE — Telephone Encounter (Signed)
Per staff message and POF I have scheduled appts. Advised scheduler of appts. JMW  

## 2015-11-04 ENCOUNTER — Ambulatory Visit (HOSPITAL_BASED_OUTPATIENT_CLINIC_OR_DEPARTMENT_OTHER): Payer: BLUE CROSS/BLUE SHIELD

## 2015-11-04 VITALS — Temp 98.3°F

## 2015-11-04 DIAGNOSIS — D509 Iron deficiency anemia, unspecified: Secondary | ICD-10-CM

## 2015-11-04 MED ORDER — SODIUM CHLORIDE 0.9 % IV SOLN
Freq: Once | INTRAVENOUS | Status: AC
  Administered 2015-11-04: 16:00:00 via INTRAVENOUS

## 2015-11-04 MED ORDER — HEPARIN SOD (PORK) LOCK FLUSH 100 UNIT/ML IV SOLN
500.0000 [IU] | Freq: Once | INTRAVENOUS | Status: AC | PRN
  Filled 2015-11-04: qty 5

## 2015-11-04 MED ORDER — SODIUM CHLORIDE 0.9 % IJ SOLN
10.0000 mL | INTRAMUSCULAR | Status: AC | PRN
  Filled 2015-11-04: qty 10

## 2015-11-04 MED ORDER — FERUMOXYTOL INJECTION 510 MG/17 ML
510.0000 mg | Freq: Once | INTRAVENOUS | Status: AC
  Administered 2015-11-04: 510 mg via INTRAVENOUS
  Filled 2015-11-04: qty 17

## 2015-11-09 NOTE — Progress Notes (Signed)
[name]  HEMATOLOGY ONCOLOGY PROGRESS NOTE  Date of service: .10/25/2015  Patient Care Team: Lottie Dawson, MD as PCP - General (Internal Medicine)  Diagnosis: Iron deficiency Anemia  Current Treatment: IV feraheme as needed  Received IV feraheme 510 mg in 08/2015  INTERVAL HISTORY:  [name] is here for follow-up for iron deficiency anemia.she knows of energy levels went up for about a month or so after getting the  One dose of IV Feraheme. She feels like she might need more IV iron. Hemoglobin levels today her stable and her ferritin level is up from 8 to 28.iron saturation is up from 7% to 11%. She notes no other acute new symptoms. Still having a little bit of swallowing problems. Her CT of the neck showed no cause for her dysphagia and no thyromegaly.   REVIEW OF SYSTEMS:    10 Point review of systems of done and is negative except as noted above.  . Past Medical History  Diagnosis Date  . Diabetes mellitus type 1 ([place]) 1998    Currently being treated with an insulin pump  . Dyslipidemia   . Shoulder pain, bilateral     Patient reports this was related to bone spurs that significantly limited her range of motion . Left shoulder is better after surgery.  [name] Kitchen Heart murmur   . Abducent nerve palsy, left eye     Thought to be related to her diabetes  . GERD (gastroesophageal reflux disease)   . Depression   . Diabetic retinopathy ([place])     . Past Surgical History  Procedure Laterality Date  . Cesarean section  1996  . Shoulder surgery Left     For bone spurs    . Social History  Substance Use Topics  . Smoking status: Never Smoker   . Smokeless tobacco: None  . Alcohol Use: 0.0 oz/week    0 Standard drinks or equivalent per week     Comment: rare - 3 to 4 times a year    ALLERGIES:  is allergic to lipitor.  MEDICATIONS:  Current Outpatient Prescriptions  Medication Sig Dispense Refill  . acetaminophen (TYLENOL) 650 MG CR tablet Take 650 mg by  mouth as needed for pain.    [name] Kitchen b complex vitamins tablet Take 1 tablet by mouth daily.    . cholecalciferol (VITAMIN D) 1000 UNITS tablet Take 1,000 Units by mouth daily.    [name] Kitchen ibuprofen (ADVIL) 200 MG tablet 1 tablet as needed    . insulin aspart (NOVOLOG) 100 UNIT/ML injection For use in pump, for a total of 75 units per day 80 mL 3  . Insulin Infusion Pump Supplies (PARADIGM QUICK-SET 32" 6MM) MISC 1 Device by Does not apply route every 3 (three) days.    . Insulin Infusion Pump Supplies (PARADIGM RESERVOIR 3ML) MISC 1 Device by Does not apply route every 3 (three) days.    . Multiple Vitamin (MULTIVITAMIN WITH MINERALS) TABS tablet Take 1 tablet by mouth daily.    [name] Kitchen omeprazole (PRILOSEC) 20 MG capsule Take 20 mg by mouth daily.    . simvastatin (ZOCOR) 20 MG tablet Take 20 mg by mouth daily.    . vitamin C (ASCORBIC ACID) 500 MG tablet Take 500 mg by mouth daily.    . traMADol (ULTRAM) 50 MG tablet Take by mouth every 6 (six) hours as needed.     No current facility-administered medications for this visit.   Facility-Administered Medications Ordered in Other Visits  Medication Dose Route Frequency  Provider Last Rate Last Dose  . heparin lock flush 100 unit/mL  500 Units Intracatheter Once PRN Brunetta Genera, MD      . sodium chloride 0.9 % injection 10 mL  10 mL Intracatheter PRN Gautam Juleen China, MD        PHYSICAL EXAMINATION: ECOG PERFORMANCE STATUS: 1 - Symptomatic but completely ambulatory  . Filed Vitals:   10/25/15 1510  BP: 130/69  Pulse: 77  Temp: 98.3 F (36.8 C)  Resp: 18    Filed Weights   10/25/15 1510  Weight: 208 lb 1.6 oz (94.394 kg)   .Body mass index is 30.72 kg/(m^2).  GENERAL:alert, in no acute distress and comfortable SKIN: skin color, texture, turgor are normal, no rashes or significant lesions EYES: normal, conjunctiva are pink and non-injected, sclera clear OROPHARYNX:no exudate, no erythema and lips, buccal mucosa, and tongue normal    NECK: supple, no JVD, thyroid normal size, non-tender, without nodularity LYMPH:  no palpable lymphadenopathy in the cervical, axillary or inguinal LUNGS: clear to auscultation with normal respiratory effort HEART: regular rate & rhythm,  no murmurs and no lower extremity edema ABDOMEN: abdomen soft, non-tender, normoactive bowel sounds  Musculoskeletal: no cyanosis of digits and no clubbing  PSYCH: alert & oriented x 3 with fluent speech NEURO: no focal motor/sensory deficits  LABORATORY DATA:   I have reviewed the data as listed  . CBC Latest Ref Rng 10/25/2015 08/25/2015  WBC 3.9 - 10.3 10e3/uL 6.4 7.3  Hemoglobin 11.6 - 15.9 g/dL 12.4 12.3  Hematocrit 34.8 - 46.6 % 37.3 37.3  Platelets 145 - 400 10e3/uL 402(H) 393    . CMP Latest Ref Rng 08/25/2015  Glucose 70 - 140 mg/dl 135  BUN 7.0 - 26.0 mg/dL 10.9  Creatinine 0.6 - 1.1 mg/dL 0.9  Sodium 136 - 145 mEq/L 140  Potassium 3.5 - 5.1 mEq/L 4.1  CO2 22 - 29 mEq/L 27  Calcium 8.4 - 10.4 mg/dL 9.7  Total Protein 6.4 - 8.3 g/dL 7.2  Total Bilirubin 0.20 - 1.20 mg/dL 0.32  Alkaline Phos 40 - 150 U/L 84  AST 5 - 34 U/L 14  ALT 0 - 55 U/L 11   . Lab Results  Component Value Date   IRON 30* 10/25/2015   TIBC 285 10/25/2015   IRONPCTSAT 11* 10/25/2015   (Iron and TIBC)  Lab Results  Component Value Date   FERRITIN 28 10/25/2015      RADIOGRAPHIC STUDIES: I have personally reviewed the radiological images as listed and agreed with the findings in the report.  CT NECK WITH CONTRAST 09/02/2015  TECHNIQUE: Multidetector CT imaging of the neck was performed using the standard protocol following the bolus administration of intravenous contrast.  CONTRAST: 139mL OMNIPAQUE IOHEXOL 300 MG/ML SOLN  COMPARISON: None.  FINDINGS: Normal-appearing mucosal surfaces of the pharynx. Normal larynx. Unremarkable tonsillar and adenoid regions. Normal airway.  Thyroid gland appears normal in size. No significant  nodularity or tracheal deviation.  No pathologic adenopathy.  Normal major and minor salivary glands.  Vascular structures within normal limits. No significant atherosclerosis. No sinus or mastoid disease. Visualized orbits unremarkable. Negative intracranial compartment. No osseous findings. Unremarkable upper chest and mediastinum.  IMPRESSION: No cause is seen for dysphagia. No thyromegaly.  ASSESSMENT & PLAN:   #1 Severe iron deficiency with minimal anemia. Ferritin 8, iron saturation 7%pretreatment prior to last visit. Her ferritin level is up to 28 with an iron saturation 11% after 1 dose of IV Feraheme. She notes some  improvement in energy levels but does not feel its optimal yet. She notes that she is unable to tolerate any oral iron due to significant GI distress and is keen take replace her Iron IV. Chronic PPI therapy is certainly an additional risk factor for developing iron deficiency. Plan -we will give her the second dose of IV Feraheme to ensure adequate iron replacement given her persistent symptoms. -Patient will take cetirizine and famotidine tablets prior to each infusion of IV iron to reduce the risk of allergic reactions. -Primary care physician to consider GYN referral to evaluate heavier menstrual bleeding. -Consider additional lifestyle modifications for GERD to be able to wean off PPI therapy if possible.  #2 oropharyngeal dysphagia  CT of the neck done which showed no evidence of overt pathology or thyromegaly. Plan -Follow-up with primary care physician for further workup of her oropharyngeal dysphagia. -Consider modified barium swallow/ ENT referral for further evaluation.  Return to care in 4 months for repeat CBC, ferritin, iron profile with Dr. Irene Limbo.  I spent 15 minutes counseling the patient face to face. The total time spent in the appointment was 20 minutes and more than 50% was on counseling and direct patient cares.    Sullivan Lone MD  [place] AAHIVMS [facility] Hematology/Oncology Physician [facility]  (Office):       [phone] (Work cell):  [phone] (Fax):           [phone]

## 2015-11-10 ENCOUNTER — Other Ambulatory Visit (INDEPENDENT_AMBULATORY_CARE_PROVIDER_SITE_OTHER): Payer: BLUE CROSS/BLUE SHIELD | Admitting: Ophthalmology

## 2016-01-04 ENCOUNTER — Other Ambulatory Visit: Payer: Self-pay | Admitting: Orthopedic Surgery

## 2016-01-17 ENCOUNTER — Ambulatory Visit: Payer: BLUE CROSS/BLUE SHIELD | Admitting: Endocrinology

## 2016-01-19 ENCOUNTER — Telehealth: Payer: Self-pay | Admitting: Endocrinology

## 2016-01-19 NOTE — Telephone Encounter (Signed)
Please come back for a follow-up appointment in 2 weeks 

## 2016-01-19 NOTE — Telephone Encounter (Signed)
Patient no showed today's appt. Please advise on how to follow up. °A. No follow up necessary. °B. Follow up urgent. Contact patient immediately. °C. Follow up necessary. Contact patient and schedule visit in ___ days. °D. Follow up advised. Contact patient and schedule visit in ____weeks. ° °

## 2016-01-20 NOTE — Telephone Encounter (Signed)
No show letter mailed to the pt.  

## 2016-01-25 DIAGNOSIS — M6530 Trigger finger, unspecified finger: Secondary | ICD-10-CM

## 2016-01-25 HISTORY — DX: Trigger finger, unspecified finger: M65.30

## 2016-01-27 ENCOUNTER — Encounter (HOSPITAL_BASED_OUTPATIENT_CLINIC_OR_DEPARTMENT_OTHER): Payer: Self-pay | Admitting: *Deleted

## 2016-01-27 DIAGNOSIS — R0982 Postnasal drip: Secondary | ICD-10-CM

## 2016-01-27 DIAGNOSIS — R0981 Nasal congestion: Secondary | ICD-10-CM

## 2016-01-27 DIAGNOSIS — R059 Cough, unspecified: Secondary | ICD-10-CM

## 2016-01-27 HISTORY — DX: Nasal congestion: R09.81

## 2016-01-27 HISTORY — DX: Postnasal drip: R09.82

## 2016-01-27 HISTORY — DX: Cough, unspecified: R05.9

## 2016-01-27 NOTE — Pre-Procedure Instructions (Signed)
To come for BMET and EKG 

## 2016-01-31 ENCOUNTER — Encounter (HOSPITAL_BASED_OUTPATIENT_CLINIC_OR_DEPARTMENT_OTHER)
Admission: RE | Admit: 2016-01-31 | Discharge: 2016-01-31 | Disposition: A | Discharge status: Home / Self Care | Payer: BLUE CROSS/BLUE SHIELD | Source: Ambulatory Visit | Attending: Orthopedic Surgery | Admitting: Orthopedic Surgery

## 2016-01-31 DIAGNOSIS — E1040 Type 1 diabetes mellitus with diabetic neuropathy, unspecified: Secondary | ICD-10-CM | POA: Diagnosis not present

## 2016-01-31 DIAGNOSIS — Z794 Long term (current) use of insulin: Secondary | ICD-10-CM | POA: Diagnosis not present

## 2016-01-31 DIAGNOSIS — M65321 Trigger finger, right index finger: Secondary | ICD-10-CM | POA: Diagnosis not present

## 2016-01-31 DIAGNOSIS — K219 Gastro-esophageal reflux disease without esophagitis: Secondary | ICD-10-CM | POA: Diagnosis not present

## 2016-01-31 DIAGNOSIS — E785 Hyperlipidemia, unspecified: Secondary | ICD-10-CM | POA: Diagnosis not present

## 2016-01-31 DIAGNOSIS — E10319 Type 1 diabetes mellitus with unspecified diabetic retinopathy without macular edema: Secondary | ICD-10-CM | POA: Diagnosis not present

## 2016-01-31 DIAGNOSIS — Z79899 Other long term (current) drug therapy: Secondary | ICD-10-CM | POA: Diagnosis not present

## 2016-01-31 LAB — BASIC METABOLIC PANEL
Anion gap: 15 (ref 5–15)
BUN: 6 mg/dL (ref 6–20)
CALCIUM: 9.5 mg/dL (ref 8.9–10.3)
CO2: 23 mmol/L (ref 22–32)
Chloride: 100 mmol/L — ABNORMAL LOW (ref 101–111)
Creatinine, Ser: 0.88 mg/dL (ref 0.44–1.00)
GFR calc Af Amer: >60 mL/min (ref >60)
GLUCOSE: 218 mg/dL — AB (ref 65–99)
Potassium: 4.1 mmol/L (ref 3.5–5.1)
SODIUM: 138 mmol/L (ref 135–145)

## 2016-02-02 ENCOUNTER — Ambulatory Visit (HOSPITAL_BASED_OUTPATIENT_CLINIC_OR_DEPARTMENT_OTHER): Payer: BLUE CROSS/BLUE SHIELD | Admitting: Anesthesiology

## 2016-02-02 ENCOUNTER — Encounter (HOSPITAL_BASED_OUTPATIENT_CLINIC_OR_DEPARTMENT_OTHER): Payer: Self-pay | Admitting: Certified Registered"

## 2016-02-02 ENCOUNTER — Encounter (HOSPITAL_BASED_OUTPATIENT_CLINIC_OR_DEPARTMENT_OTHER)
Admission: RE | Disposition: A | Discharge status: Home / Self Care | Payer: Self-pay | Source: Ambulatory Visit | Attending: Orthopedic Surgery

## 2016-02-02 ENCOUNTER — Ambulatory Visit (HOSPITAL_BASED_OUTPATIENT_CLINIC_OR_DEPARTMENT_OTHER)
Admission: RE | Admit: 2016-02-02 | Discharge: 2016-02-02 | Disposition: A | Discharge status: Home / Self Care | Payer: BLUE CROSS/BLUE SHIELD | Source: Ambulatory Visit | Attending: Orthopedic Surgery | Admitting: Orthopedic Surgery

## 2016-02-02 DIAGNOSIS — Z79899 Other long term (current) drug therapy: Secondary | ICD-10-CM | POA: Insufficient documentation

## 2016-02-02 DIAGNOSIS — Z794 Long term (current) use of insulin: Secondary | ICD-10-CM | POA: Insufficient documentation

## 2016-02-02 DIAGNOSIS — M65321 Trigger finger, right index finger: Secondary | ICD-10-CM | POA: Diagnosis not present

## 2016-02-02 DIAGNOSIS — E10319 Type 1 diabetes mellitus with unspecified diabetic retinopathy without macular edema: Secondary | ICD-10-CM | POA: Insufficient documentation

## 2016-02-02 DIAGNOSIS — K219 Gastro-esophageal reflux disease without esophagitis: Secondary | ICD-10-CM | POA: Insufficient documentation

## 2016-02-02 DIAGNOSIS — E1040 Type 1 diabetes mellitus with diabetic neuropathy, unspecified: Secondary | ICD-10-CM | POA: Insufficient documentation

## 2016-02-02 DIAGNOSIS — E785 Hyperlipidemia, unspecified: Secondary | ICD-10-CM | POA: Insufficient documentation

## 2016-02-02 HISTORY — DX: Cough: R05

## 2016-02-02 HISTORY — PX: TRIGGER FINGER RELEASE: SHX641

## 2016-02-02 HISTORY — DX: Anemia, unspecified: D64.9

## 2016-02-02 HISTORY — DX: Nasal congestion: R09.81

## 2016-02-02 HISTORY — DX: Trigger finger, unspecified finger: M65.30

## 2016-02-02 HISTORY — DX: Acne, unspecified: L70.9

## 2016-02-02 HISTORY — DX: Type 2 diabetes mellitus with diabetic neuropathy, unspecified: E11.40

## 2016-02-02 HISTORY — DX: Postnasal drip: R09.82

## 2016-02-02 LAB — GLUCOSE, CAPILLARY
Glucose-Capillary: 143 mg/dL — ABNORMAL HIGH (ref 65–99)
Glucose-Capillary: 182 mg/dL — ABNORMAL HIGH (ref 65–99)

## 2016-02-02 SURGERY — RELEASE, A1 PULLEY, FOR TRIGGER FINGER
Anesthesia: Monitor Anesthesia Care | Site: Finger | Laterality: Right

## 2016-02-02 MED ORDER — ONDANSETRON HCL 4 MG/2ML IJ SOLN
INTRAMUSCULAR | Status: DC | PRN
  Administered 2016-02-02: 4 mg via INTRAVENOUS

## 2016-02-02 MED ORDER — SCOPOLAMINE 1 MG/3DAYS TD PT72: 1.0000 | MEDICATED_PATCH | Freq: Once | TRANSDERMAL | Status: DC | PRN

## 2016-02-02 MED ORDER — LACTATED RINGERS IV SOLN
INTRAVENOUS | Status: DC
  Administered 2016-02-02: 08:00:00 via INTRAVENOUS

## 2016-02-02 MED ORDER — FENTANYL CITRATE (PF) 100 MCG/2ML IJ SOLN: 25.0000 ug | INTRAMUSCULAR | Status: DC | PRN

## 2016-02-02 MED ORDER — CEFAZOLIN SODIUM-DEXTROSE 2-3 GM-% IV SOLR
2.0000 g | INTRAVENOUS | Status: AC
  Administered 2016-02-02: 2 g via INTRAVENOUS

## 2016-02-02 MED ORDER — GLYCOPYRROLATE 0.2 MG/ML IJ SOLN: 0.2000 mg | Freq: Once | INTRAMUSCULAR | Status: DC | PRN

## 2016-02-02 MED ORDER — HYDROCODONE-ACETAMINOPHEN 5-325 MG PO TABS: ORAL_TABLET | ORAL | Status: DC

## 2016-02-02 MED ORDER — ONDANSETRON HCL 4 MG/2ML IJ SOLN
INTRAMUSCULAR | Status: AC
  Filled 2016-02-02: qty 2

## 2016-02-02 MED ORDER — CEFAZOLIN SODIUM-DEXTROSE 2-3 GM-% IV SOLR
INTRAVENOUS | Status: AC
  Filled 2016-02-02: qty 50

## 2016-02-02 MED ORDER — LIDOCAINE HCL (CARDIAC) 20 MG/ML IV SOLN
INTRAVENOUS | Status: DC | PRN
  Administered 2016-02-02: 25 mg via INTRAVENOUS

## 2016-02-02 MED ORDER — 0.9 % SODIUM CHLORIDE (POUR BTL) OPTIME
TOPICAL | Status: DC | PRN
  Administered 2016-02-02: 100 mL

## 2016-02-02 MED ORDER — DEXAMETHASONE SODIUM PHOSPHATE 10 MG/ML IJ SOLN
INTRAMUSCULAR | Status: AC
  Filled 2016-02-02: qty 1

## 2016-02-02 MED ORDER — BUPIVACAINE HCL (PF) 0.25 % IJ SOLN
INTRAMUSCULAR | Status: AC
Start: 2016-02-02 — End: 2016-02-02
  Filled 2016-02-02: qty 30

## 2016-02-02 MED ORDER — BUPIVACAINE HCL (PF) 0.25 % IJ SOLN
INTRAMUSCULAR | Status: DC | PRN
  Administered 2016-02-02: 9 mL

## 2016-02-02 MED ORDER — CHLORHEXIDINE GLUCONATE 4 % EX LIQD: 60.0000 mL | Freq: Once | CUTANEOUS | Status: DC

## 2016-02-02 MED ORDER — SUCCINYLCHOLINE CHLORIDE 20 MG/ML IJ SOLN
INTRAMUSCULAR | Status: AC
  Filled 2016-02-02: qty 1

## 2016-02-02 MED ORDER — FENTANYL CITRATE (PF) 100 MCG/2ML IJ SOLN
INTRAMUSCULAR | Status: AC
  Filled 2016-02-02: qty 2

## 2016-02-02 MED ORDER — ATROPINE SULFATE 0.4 MG/ML IJ SOLN
INTRAMUSCULAR | Status: AC
  Filled 2016-02-02: qty 1

## 2016-02-02 MED ORDER — KETOROLAC TROMETHAMINE 30 MG/ML IJ SOLN: 30.0000 mg | Freq: Once | INTRAMUSCULAR | Status: DC | PRN

## 2016-02-02 MED ORDER — FENTANYL CITRATE (PF) 100 MCG/2ML IJ SOLN
50.0000 ug | INTRAMUSCULAR | Status: DC | PRN
Start: 2016-02-02 — End: 2016-02-02
  Administered 2016-02-02 (×2): 50 ug via INTRAVENOUS

## 2016-02-02 MED ORDER — LIDOCAINE HCL (CARDIAC) 20 MG/ML IV SOLN
INTRAVENOUS | Status: AC
  Filled 2016-02-02: qty 5

## 2016-02-02 MED ORDER — PROMETHAZINE HCL 25 MG/ML IJ SOLN: 6.2500 mg | INTRAMUSCULAR | Status: DC | PRN

## 2016-02-02 MED ORDER — MIDAZOLAM HCL 2 MG/2ML IJ SOLN
1.0000 mg | INTRAMUSCULAR | Status: DC | PRN
  Administered 2016-02-02: 2 mg via INTRAVENOUS

## 2016-02-02 MED ORDER — MIDAZOLAM HCL 2 MG/2ML IJ SOLN
INTRAMUSCULAR | Status: AC
  Filled 2016-02-02: qty 2

## 2016-02-02 MED ORDER — PROPOFOL 500 MG/50ML IV EMUL
INTRAVENOUS | Status: DC | PRN
  Administered 2016-02-02 (×2): 20 ug via INTRAVENOUS
  Administered 2016-02-02: 30 ug via INTRAVENOUS

## 2016-02-02 SURGICAL SUPPLY — 38 items
BANDAGE COBAN STERILE 2 (GAUZE/BANDAGES/DRESSINGS) ×3 IMPLANT
BLADE MINI RND TIP GREEN BEAV (BLADE) ×3 IMPLANT
BLADE SURG 15 STRL LF DISP TIS (BLADE) ×2 IMPLANT
BLADE SURG 15 STRL SS (BLADE) ×4
BNDG CONFORM 2 STRL LF (GAUZE/BANDAGES/DRESSINGS) ×3 IMPLANT
BNDG ESMARK 4X9 LF (GAUZE/BANDAGES/DRESSINGS) ×3 IMPLANT
CHLORAPREP W/TINT 26ML (MISCELLANEOUS) ×3 IMPLANT
CORDS BIPOLAR (ELECTRODE) ×3 IMPLANT
COVER BACK TABLE 60X90IN (DRAPES) ×3 IMPLANT
COVER MAYO STAND STRL (DRAPES) ×3 IMPLANT
CUFF TOURNIQUET SINGLE 18IN (TOURNIQUET CUFF) ×3 IMPLANT
DRAPE EXTREMITY T 121X128X90 (DRAPE) ×3 IMPLANT
DRAPE SURG 17X23 STRL (DRAPES) ×3 IMPLANT
GAUZE SPONGE 4X4 12PLY STRL (GAUZE/BANDAGES/DRESSINGS) ×3 IMPLANT
GAUZE XEROFORM 1X8 LF (GAUZE/BANDAGES/DRESSINGS) ×3 IMPLANT
GLOVE BIO SURGEON STRL SZ 6 (GLOVE) ×3 IMPLANT
GLOVE BIO SURGEON STRL SZ7.5 (GLOVE) ×3 IMPLANT
GLOVE BIOGEL M STRL SZ7.5 (GLOVE) ×3 IMPLANT
GLOVE BIOGEL PI IND STRL 7.0 (GLOVE) ×1 IMPLANT
GLOVE BIOGEL PI IND STRL 8 (GLOVE) ×2 IMPLANT
GLOVE BIOGEL PI INDICATOR 7.0 (GLOVE) ×2
GLOVE BIOGEL PI INDICATOR 8 (GLOVE) ×4
GLOVE SURG SS PI 6.0 STRL IVOR (GLOVE) ×3 IMPLANT
GOWN STRL REUS W/ TWL LRG LVL3 (GOWN DISPOSABLE) ×1 IMPLANT
GOWN STRL REUS W/ TWL XL LVL3 (GOWN DISPOSABLE) ×2 IMPLANT
GOWN STRL REUS W/TWL LRG LVL3 (GOWN DISPOSABLE) ×2
GOWN STRL REUS W/TWL XL LVL3 (GOWN DISPOSABLE) ×4
NEEDLE HYPO 25X1 1.5 SAFETY (NEEDLE) ×3 IMPLANT
NS IRRIG 1000ML POUR BTL (IV SOLUTION) ×3 IMPLANT
PACK BASIN DAY SURGERY FS (CUSTOM PROCEDURE TRAY) ×3 IMPLANT
PADDING CAST ABS 4INX4YD NS (CAST SUPPLIES)
PADDING CAST ABS COTTON 4X4 ST (CAST SUPPLIES) IMPLANT
STOCKINETTE 4X48 STRL (DRAPES) ×3 IMPLANT
SUT ETHILON 4 0 PS 2 18 (SUTURE) ×3 IMPLANT
SYR BULB 3OZ (MISCELLANEOUS) ×3 IMPLANT
SYR CONTROL 10ML LL (SYRINGE) ×3 IMPLANT
TOWEL OR 17X24 6PK STRL BLUE (TOWEL DISPOSABLE) ×6 IMPLANT
UNDERPAD 30X30 (UNDERPADS AND DIAPERS) ×3 IMPLANT

## 2016-02-02 NOTE — Anesthesia Preprocedure Evaluation (Addendum)
Anesthesia Evaluation  Patient identified by MRN, date of birth, ID band Patient awake    Reviewed: Allergy & Precautions, NPO status , Patient's Chart, lab work & pertinent test results  Airway Mallampati: II  TM Distance: >3 FB Neck ROM: Full    Dental no notable dental hx.    Pulmonary neg pulmonary ROS,    Pulmonary exam normal breath sounds clear to auscultation       Cardiovascular negative cardio ROS Normal cardiovascular exam Rhythm:Regular Rate:Normal     Neuro/Psych negative neurological ROS  negative psych ROS   GI/Hepatic Neg liver ROS, GERD  Medicated,  Endo/Other  diabetes, Insulin Dependent  Renal/GU negative Renal ROS  negative genitourinary   Musculoskeletal negative musculoskeletal ROS (+)   Abdominal   Peds negative pediatric ROS (+)  Hematology negative hematology ROS (+)   Anesthesia Other Findings   Reproductive/Obstetrics negative OB ROS                            Anesthesia Physical Anesthesia Plan  ASA: III  Anesthesia Plan: MAC   Post-op Pain Management:    Induction: Intravenous  Airway Management Planned: Simple Face Mask  Additional Equipment:   Intra-op Plan:   Post-operative Plan:   Informed Consent: I have reviewed the patients History and Physical, chart, labs and discussed the procedure including the risks, benefits and alternatives for the proposed anesthesia with the patient or authorized representative who has indicated his/her understanding and acceptance.   Dental advisory given  Plan Discussed with: CRNA and Surgeon  Anesthesia Plan Comments:        Anesthesia Quick Evaluation

## 2016-02-02 NOTE — Anesthesia Procedure Notes (Signed)
Procedure Name: MAC Date/Time: 02/02/2016 8:44 AM Performed by: Baxter Flattery Pre-anesthesia Checklist: Patient identified, Emergency Drugs available, Suction available and Patient being monitored Patient Re-evaluated:Patient Re-evaluated prior to inductionOxygen Delivery Method: Simple face mask Preoxygenation: Pre-oxygenation with 100% oxygen Intubation Type: IV induction Ventilation: Mask ventilation without difficulty Placement Confirmation: breath sounds checked- equal and bilateral Dental Injury: Teeth and Oropharynx as per pre-operative assessment

## 2016-02-02 NOTE — Anesthesia Postprocedure Evaluation (Signed)
Anesthesia Post Note  Patient: Cynthia Moody  Procedure(s) Performed: Procedure(s) (LRB): RIGHT INDEX RELEASE TRIGGER   (Right)  Patient location during evaluation: PACU Anesthesia Type: MAC Level of consciousness: awake and alert Pain management: pain level controlled Vital Signs Assessment: post-procedure vital signs reviewed and stable Respiratory status: spontaneous breathing, nonlabored ventilation, respiratory function stable and patient connected to nasal cannula oxygen Cardiovascular status: blood pressure returned to baseline and stable Postop Assessment: no signs of nausea or vomiting Anesthetic complications: no    Last Vitals:  Filed Vitals:   02/02/16 0914 02/02/16 0915  BP:  127/79  Pulse: 75 78  Temp:    Resp:  14    Last Pain:  Filed Vitals:   02/02/16 0927  PainSc: 7                  Qadir Folks S

## 2016-02-02 NOTE — H&P (Signed)
Cynthia Moody is an 45 y.o. female.   Chief Complaint: right index trigger digit HPI: 45 yo female with triggering of right index finger.  This is recurrent after one injection.  She wishes to proceed with surgical release.    Allergies:  Allergies  Allergen Reactions  . Lipitor [Atorvastatin] Swelling    LEGS    Past Medical History  Diagnosis Date  . Dyslipidemia   . GERD (gastroesophageal reflux disease)   . Depression   . Diabetic retinopathy ([place])     bilateral  . Anemia   . Shoulder pain, bilateral     wakes up frequently due to pain  . Diabetes mellitus type 1 ([place])   . Diabetic neuropathy (HCC)     feet  . Post-nasal drip 01/27/2016  . Stuffy nose 01/27/2016  . Cough 01/27/2016  . Acne   . Trigger finger of right hand 01/2016    index finger    Past Surgical History  Procedure Laterality Date  . Cesarean section  01/17/1995  . Shoulder surgery Left 06/29/2013    exc. bone spur    Family History: Family History  Problem Relation Age of Onset  . Diabetes Mother   . Heart attack Mother     Age 72  . Heart attack Father     Age 74    Social History:   reports that she has never smoked. She has never used smokeless tobacco. She reports that she drinks alcohol. She reports that she does not use illicit drugs.  Medications: Medications Prior to Admission  Medication Sig Dispense Refill  . acetaminophen (TYLENOL) 650 MG CR tablet Take 650 mg by mouth as needed for pain.    [name] Kitchen b complex vitamins tablet Take 1 tablet by mouth daily.    . cholecalciferol (VITAMIN D) 1000 UNITS tablet Take 1,000 Units by mouth daily.    [name] Kitchen ibuprofen (ADVIL) 200 MG tablet 1 tablet as needed    . insulin lispro (HUMALOG) 100 UNIT/ML injection Inject into the skin continuous. VIA INSULIN PUMP    . omeprazole (PRILOSEC) 20 MG capsule Take 20 mg by mouth 2 (two) times daily before a meal.     . simvastatin (ZOCOR) 20 MG tablet Take 20 mg by mouth daily.    . Insulin Infusion Pump Supplies  (PARADIGM QUICK-SET 32" 6MM) MISC 1 Device by Does not apply route every 3 (three) days.    . Insulin Infusion Pump Supplies (PARADIGM RESERVOIR 3ML) MISC 1 Device by Does not apply route every 3 (three) days.      Results for orders placed or performed during the hospital encounter of 02/02/16 (from the past 48 hour(s))  Basic metabolic panel     Status: Abnormal   Collection Time: 01/31/16  3:45 PM  Result Value Ref Range   Sodium 138 135 - 145 mmol/L   Potassium 4.1 3.5 - 5.1 mmol/L   Chloride 100 (L) 101 - 111 mmol/L   CO2 23 22 - 32 mmol/L   Glucose, Bld 218 (H) 65 - 99 mg/dL   BUN 6 6 - 20 mg/dL   Creatinine, Ser 0.88 0.44 - 1.00 mg/dL   Calcium 9.5 8.9 - 10.3 mg/dL   GFR calc non Af Amer >60 >60 mL/min   GFR calc Af Amer >60 >60 mL/min    Comment: (NOTE) The eGFR has been calculated using the CKD EPI equation. This calculation has not been validated in all clinical situations. eGFR's persistently <60 mL/min signify possible  Chronic Kidney Disease.    Anion gap 15 5 - 15  Glucose, capillary     Status: Abnormal   Collection Time: 02/02/16  7:38 AM  Result Value Ref Range   Glucose-Capillary 143 (H) 65 - 99 mg/dL    No results found.   A comprehensive review of systems was negative.  Blood pressure 111/79, pulse 85, temperature 98.2 F (36.8 C), temperature source Oral, resp. rate 16, height 5' 6"  (1.676 m), weight 96.435 kg (212 lb 9.6 oz), last menstrual period 01/19/2016, SpO2 100 %.  General appearance: alert, cooperative and appears stated age Head: Normocephalic, without obvious abnormality, atraumatic Neck: supple, symmetrical, trachea midline Resp: clear to auscultation bilaterally Cardio: regular rate and rhythm GI: non-tender Extremities:   Intact sensation and capillary refill all digits.  +epl/fpl/io.  No wounds. Pulses: 2+ and symmetric Skin: Skin color, texture, turgor normal. No rashes or lesions Neurologic: Grossly  normal Incision/Wound: none  Assessment/Plan Right index finger trigger digit.  Non operative and operative treatment options were discussed with the patient and patient wishes to proceed with operative treatment. Risks, benefits, and alternatives of surgery were discussed and the patient agrees with the plan of care.   Ilee Randleman R 02/02/2016, 8:35 AM

## 2016-02-02 NOTE — Transfer of Care (Signed)
Immediate Anesthesia Transfer of Care Note  Patient: Cynthia Moody  Procedure(s) Performed: Procedure(s): RIGHT INDEX RELEASE TRIGGER   (Right)  Patient Location: PACU  Anesthesia Type:MAC  Level of Consciousness: awake, alert  and oriented  Airway & Oxygen Therapy: Patient Spontanous Breathing and Patient connected to face mask oxygen  Post-op Assessment: Report given to RN, Post -op Vital signs reviewed and stable and Patient moving all extremities  Post vital signs: Reviewed and stable  Last Vitals:  Filed Vitals:   02/02/16 0725  BP: 111/79  Pulse: 85  Temp: 36.8 C  Resp: 16    Complications: No apparent anesthesia complications

## 2016-02-02 NOTE — Op Note (Signed)
NAMESEHAR, BISWAS                ACCOUNT NO.:  [id number]  MEDICAL RECORD NO.:  [id number]  LOCATION:                                 FACILITY:  PHYSICIAN:  Leanora Cover, MD        DATE OF BIRTH:  26-Aug-1971  DATE OF PROCEDURE:  02/02/2016 DATE OF DISCHARGE:                              OPERATIVE REPORT   PREOPERATIVE DIAGNOSIS:  Right index finger trigger digit.  POSTOPERATIVE DIAGNOSIS:  Right index finger trigger digit.  PROCEDURE:  Right index finger trigger release.  SURGEON:  Leanora Cover, MD.  ASSISTANT:  None.  ANESTHESIA:  IV sedation with local.  IV FLUIDS:  Per anesthesia flow sheet.  ESTIMATED BLOOD LOSS:  Minimal.  COMPLICATIONS:  None.  SPECIMENS:  None.  TOURNIQUET TIME:  15 minutes.  DISPOSITION:  Stable to PACU.  INDICATIONS:  Ms. Cynthia Moody is a 45 year old female who has had triggering of the right index finger.  She has had this injected without relief. She wished to have a surgical release.  Risks, benefits, and alternatives of surgery were discussed including risk of blood loss; infection; damage to nerves, vessels, tendons, ligaments, bone; failure of surgery; need for additional surgery; complications with wound healing; continued pain and recurrence of triggering.  She had some contracture of the PIP joint and was advised that this may not correct.  OPERATIVE COURSE:  After being identified preoperatively by myself, the patient and I agreed upon procedure and site of procedure.  Surgical site was marked.  The risks, benefits, and alternatives of surgery were reviewed and she wished to proceed.  Surgical consent had been signed. She was given IV Ancef as preoperative antibiotic prophylaxis.  She was transported to the operating room and placed on the operating room table in supine position with the right upper extremity on arm board.  IV sedation was induced by Anesthesiology.  A surgical pause was performed between surgeons, anesthesia, and  operating room staff, and all were in agreement as to the patient, procedure, and site of procedure.  The surgical area was injected with 9 mL of 0.25% plain Marcaine.  The upper extremity was prepped and draped in normal sterile orthopedic fashion. A surgical pause was again performed between surgeons, anesthesia, and operating room staff, and all were in agreement as to the patient, procedure, and site of procedure.  Incision was made at the volar aspect of the MP joint of the index finger and carried into the subcutaneous tissues by spreading technique.  The digital nerves were identified and protected throughout the case.  The A1 and A2 pulleys were identified. There was an A0 pulley which was released as well.  The A1 pulley was released.  The tendons were brought through the wound.  There was adherence between them which was separated.  The triggering seemed to occur more with the FDP tendon.  There was still some triggering.  More of the A2 pulley was proximally dented to allow better excursion.  This eliminated the triggering.  The finger was placed through a range of motion and there was no catching.  The wound was copiously irrigated with sterile saline and closed with 4-0 nylon  in a horizontal mattress fashion.  It was then dressed with sterile Xeroform, 4x4s, and wrapped with a Kling and Coban dressing lightly.  Tourniquet was deflated at 15 minutes.  Fingertips were pink with brisk capillary refill after deflation of the tourniquet.  Operative drapes were broken down and the patient was awoken from anesthesia safely.  She was transferred back to the stretcher and taken to PACU in stable condition.  I will see her back in the office in 1 week for postoperative followup.  We will give her Norco 5/325, 1-2 p.o. q.6 hours p.r.n. pain, dispensed #30.     Leanora Cover, MD     KK/MEDQ  D:  02/02/2016  T:  02/02/2016  Job:  [id number]

## 2016-02-02 NOTE — Brief Op Note (Signed)
02/02/2016  9:12 AM  PATIENT:  Cynthia Moody  45 y.o. female  PRE-OPERATIVE DIAGNOSIS:  right index trigger M65.321  POST-OPERATIVE DIAGNOSIS:  right index trigger M65.321  PROCEDURE:  Procedure(s): RIGHT INDEX RELEASE TRIGGER   (Right)  SURGEON:  Surgeon(s) and Role:    * Leanora Cover, MD - Primary  PHYSICIAN ASSISTANT:   ASSISTANTS: none   ANESTHESIA:   local and IV sedation  EBL:  Total I/O In: 600 [I.V.:600] Out: -   BLOOD ADMINISTERED:none  DRAINS: none   LOCAL MEDICATIONS USED:  MARCAINE     SPECIMEN:  No Specimen  DISPOSITION OF SPECIMEN:  N/A  COUNTS:  YES  TOURNIQUET:   Total Tourniquet Time Documented: Forearm (Right) - 15 minutes Total: Forearm (Right) - 15 minutes   DICTATION: .Other Dictation: Dictation Number [id number]  PLAN OF CARE: Discharge to home after PACU  PATIENT DISPOSITION:  PACU - hemodynamically stable.

## 2016-02-02 NOTE — Op Note (Signed)
[id number] 

## 2016-02-02 NOTE — Discharge Instructions (Addendum)

## 2016-02-03 ENCOUNTER — Encounter (HOSPITAL_BASED_OUTPATIENT_CLINIC_OR_DEPARTMENT_OTHER): Payer: Self-pay | Admitting: Orthopedic Surgery

## 2016-02-22 ENCOUNTER — Encounter: Payer: Self-pay | Admitting: Hematology

## 2016-02-22 ENCOUNTER — Other Ambulatory Visit (HOSPITAL_BASED_OUTPATIENT_CLINIC_OR_DEPARTMENT_OTHER): Payer: BLUE CROSS/BLUE SHIELD

## 2016-02-22 ENCOUNTER — Ambulatory Visit (HOSPITAL_BASED_OUTPATIENT_CLINIC_OR_DEPARTMENT_OTHER): Payer: BLUE CROSS/BLUE SHIELD | Admitting: Hematology

## 2016-02-22 ENCOUNTER — Telehealth: Payer: Self-pay | Admitting: Hematology

## 2016-02-22 VITALS — BP 150/69 | HR 87 | Temp 98.8°F | Resp 18 | Wt 209.9 lb

## 2016-02-22 DIAGNOSIS — D509 Iron deficiency anemia, unspecified: Secondary | ICD-10-CM | POA: Diagnosis not present

## 2016-02-22 DIAGNOSIS — E010 Iodine-deficiency related diffuse (endemic) goiter: Secondary | ICD-10-CM

## 2016-02-22 LAB — COMPREHENSIVE METABOLIC PANEL
ALT: 13 U/L (ref 0–55)
AST: 18 U/L (ref 5–34)
Albumin: 4 g/dL (ref 3.5–5.0)
Alkaline Phosphatase: 93 U/L (ref 40–150)
Anion Gap: 9 mEq/L (ref 3–11)
BILIRUBIN TOTAL: 0.35 mg/dL (ref 0.20–1.20)
BUN: 10.4 mg/dL (ref 7.0–26.0)
CO2: 28 meq/L (ref 22–29)
Calcium: 9.2 mg/dL (ref 8.4–10.4)
Chloride: 100 mEq/L (ref 98–109)
Creatinine: 1.1 mg/dL (ref 0.6–1.1)
EGFR: 60 mL/min/{1.73_m2} — AB (ref >90)
GLUCOSE: 321 mg/dL — AB (ref 70–140)
POTASSIUM: 4.3 meq/L (ref 3.5–5.1)
SODIUM: 137 meq/L (ref 136–145)
TOTAL PROTEIN: 7.3 g/dL (ref 6.4–8.3)

## 2016-02-22 LAB — CBC & DIFF AND RETIC
BASO%: 0.6 % (ref 0.0–2.0)
BASOS ABS: 0 10*3/uL (ref 0.0–0.1)
EOS%: 4.3 % (ref 0.0–7.0)
Eosinophils Absolute: 0.3 10*3/uL (ref 0.0–0.5)
HCT: 37.4 % (ref 34.8–46.6)
HGB: 12.8 g/dL (ref 11.6–15.9)
IMMATURE RETIC FRACT: 1.3 % — AB (ref 1.60–10.00)
LYMPH#: 2 10*3/uL (ref 0.9–3.3)
LYMPH%: 32.1 % (ref 14.0–49.7)
MCH: 29.8 pg (ref 25.1–34.0)
MCHC: 34.2 g/dL (ref 31.5–36.0)
MCV: 87 fL (ref 79.5–101.0)
MONO#: 0.5 10*3/uL (ref 0.1–0.9)
MONO%: 7.8 % (ref 0.0–14.0)
NEUT%: 55.2 % (ref 38.4–76.8)
NEUTROS ABS: 3.5 10*3/uL (ref 1.5–6.5)
PLATELETS: 357 10*3/uL (ref 145–400)
RBC: 4.3 10*6/uL (ref 3.70–5.45)
RDW: 12 % (ref 11.2–14.5)
Retic %: 1.24 % (ref 0.70–2.10)
Retic Ct Abs: 53.32 10*3/uL (ref 33.70–90.70)
WBC: 6.3 10*3/uL (ref 3.9–10.3)

## 2016-02-22 NOTE — Telephone Encounter (Signed)
appt made and avs printed °

## 2016-02-23 LAB — TSH: TSH: 0.519 m(IU)/L (ref 0.308–3.960)

## 2016-02-23 LAB — FERRITIN: Ferritin: 61 ng/ml (ref 9–269)

## 2016-02-23 NOTE — Progress Notes (Signed)
[name]  HEMATOLOGY ONCOLOGY PROGRESS NOTE  Date of service: .02/22/2016   Patient Care Team: Lottie Dawson, MD as PCP - General (Internal Medicine)  Diagnosis: Iron deficiency Anemia  Current Treatment: IV feraheme as needed  Received IV feraheme 510 mg in 08/2015  INTERVAL HISTORY:  [name] is here for follow-up for iron deficiency anemia. She notes her energy levels are fair and she is doing okay. Has been having a cold recently and is currently using a mask. Notes her periods having been regular. No new neck pain/swelling or worsening dysphagia. Tolerated the IV iron well.   REVIEW OF SYSTEMS:    10 Point review of systems of done and is negative except as noted above.  . Past Medical History  Diagnosis Date  . Dyslipidemia   . GERD (gastroesophageal reflux disease)   . Depression   . Diabetic retinopathy ([place])     bilateral  . Anemia   . Shoulder pain, bilateral     wakes up frequently due to pain  . Diabetes mellitus type 1 ([place])   . Diabetic neuropathy (HCC)     feet  . Post-nasal drip 01/27/2016  . Stuffy nose 01/27/2016  . Cough 01/27/2016  . Acne   . Trigger finger of right hand 01/2016    index finger    . Past Surgical History  Procedure Laterality Date  . Cesarean section  01/17/1995  . Shoulder surgery Left 06/29/2013    exc. bone spur  . Trigger finger release Right 02/02/2016    Procedure: RIGHT INDEX RELEASE TRIGGER  ;  Surgeon: Leanora Cover, MD;  Location: [facility];  Service: Orthopedics;  Laterality: Right;    . Social History  Substance Use Topics  . Smoking status: Never Smoker   . Smokeless tobacco: Never Used  . Alcohol Use: 0.0 oz/week    0 Standard drinks or equivalent per week     Comment: occasionally    ALLERGIES:  is allergic to lipitor.  MEDICATIONS:  Current Outpatient Prescriptions  Medication Sig Dispense Refill  . acetaminophen (TYLENOL) 650 MG CR tablet Take 650 mg by mouth as needed for pain.      [name] Kitchen b complex vitamins tablet Take 1 tablet by mouth daily.    . cholecalciferol (VITAMIN D) 1000 UNITS tablet Take 1,000 Units by mouth daily.    [name] Kitchen HYDROcodone-acetaminophen (NORCO) 5-325 MG tablet 1-2 tabs po q6 hours prn pain 30 tablet 0  . ibuprofen (ADVIL) 200 MG tablet 1 tablet as needed    . Insulin Infusion Pump Supplies (PARADIGM QUICK-SET 32" 6MM) MISC 1 Device by Does not apply route every 3 (three) days.    . Insulin Infusion Pump Supplies (PARADIGM RESERVOIR 3ML) MISC 1 Device by Does not apply route every 3 (three) days.    . insulin lispro (HUMALOG) 100 UNIT/ML injection Inject into the skin continuous. VIA INSULIN PUMP    . omeprazole (PRILOSEC) 20 MG capsule Take 20 mg by mouth 2 (two) times daily before a meal.     . simvastatin (ZOCOR) 20 MG tablet Take 20 mg by mouth daily.     No current facility-administered medications for this visit.   Facility-Administered Medications Ordered in Other Visits  Medication Dose Route Frequency Provider Last Rate Last Dose  . heparin lock flush 100 unit/mL  500 Units Intracatheter Once PRN Brunetta Genera, MD      . sodium chloride 0.9 % injection 10 mL  10 mL Intracatheter PRN Cloria Spring  Irene Limbo, MD        PHYSICAL EXAMINATION: ECOG PERFORMANCE STATUS: 1 - Symptomatic but completely ambulatory  . Filed Vitals:   02/22/16 1510  BP: 150/69  Pulse: 87  Temp: 98.8 F (37.1 C)  Resp: 18    Filed Weights   02/22/16 1510  Weight: 209 lb 14.4 oz (95.21 kg)   .Body mass index is 33.89 kg/(m^2).  GENERAL:alert, in no acute distress and comfortable SKIN: skin color, texture, turgor are normal, no rashes or significant lesions EYES: normal, conjunctiva are pink and non-injected, sclera clear OROPHARYNX:no exudate, no erythema and lips, buccal mucosa, and tongue normal  NECK: supple, no JVD, thyroid normal size, non-tender, without nodularity LYMPH:  no palpable lymphadenopathy in the cervical, axillary or inguinal LUNGS:  clear to auscultation with normal respiratory effort HEART: regular rate & rhythm,  no murmurs and no lower extremity edema ABDOMEN: abdomen soft, non-tender, normoactive bowel sounds  Musculoskeletal: no cyanosis of digits and no clubbing  PSYCH: alert & oriented x 3 with fluent speech NEURO: no focal motor/sensory deficits  LABORATORY DATA:   I have reviewed the data as listed  . CBC Latest Ref Rng 02/22/2016 10/25/2015 08/25/2015  WBC 3.9 - 10.3 10e3/uL 6.3 6.4 7.3  Hemoglobin 11.6 - 15.9 g/dL 12.8 12.4 12.3  Hematocrit 34.8 - 46.6 % 37.4 37.3 37.3  Platelets 145 - 400 10e3/uL 357 402(H) 393    . CMP Latest Ref Rng 02/22/2016 01/31/2016 08/25/2015  Glucose 70 - 140 mg/dl 321(H) 218(H) 135  BUN 7.0 - 26.0 mg/dL 10.4 6 10.9  Creatinine 0.6 - 1.1 mg/dL 1.1 0.88 0.9  Sodium 136 - 145 mEq/L 137 138 140  Potassium 3.5 - 5.1 mEq/L 4.3 4.1 4.1  Chloride 101 - 111 mmol/L - 100(L) -  CO2 22 - 29 mEq/L 28 23 27   Calcium 8.4 - 10.4 mg/dL 9.2 9.5 9.7  Total Protein 6.4 - 8.3 g/dL 7.3 - 7.2  Total Bilirubin 0.20 - 1.20 mg/dL 0.35 - 0.32  Alkaline Phos 40 - 150 U/L 93 - 84  AST 5 - 34 U/L 18 - 14  ALT 0 - 55 U/L 13 - 11   RADIOGRAPHIC STUDIES: I have personally reviewed the radiological images as listed and agreed with the findings in the report.  ASSESSMENT & PLAN:   #1 Severe iron deficiency with minimal anemia. Ferritin 8, iron saturation 7%pretreatment prior to last visit. S/p IV feraheme x 2 doses Chronic PPI therapy is certainly an additional risk factor for developing iron deficiency. Menstrual losses on the heavier side but fairly regular. Tolerated IV iron well.  #2 intolerance to oral iron  Plan -we'll follow up on her ferritin levels today.  Her CBC appears stable with normal MCV. -based on CBC will likely not need IV iron at this time. -She will continue her slow FE wwhich is all that she tolerates with regards to iron replacement orally. -Primary care physician to consider  GYN referral to evaluate heavier menstrual bleeding as needed. -Consider additional lifestyle modifications for GERD to be able to wean off PPI therapy if possible.  #2 oropharyngeal dysphagia - stable to somewhat better. CT of the neck done which showed no evidence of overt pathology or thyromegaly. Plan -Follow-up with primary care physician for further workup of her oropharyngeal dysphagia as needed. -next Steps would be evaluation by ENT.  #3 obesity and diabetes. -Patient has joined [employer] program -Encouraged to control her diabetes better to help with her energy levels.  Return to care in 6 months for repeat CBC, ferritin, iron profile with Dr. Irene Limbo.  I spent 15 minutes counseling the patient face to face. The total time spent in the appointment was 20 minutes and more than 50% was on counseling and direct patient cares.    Sullivan Lone MD [place] AAHIVMS [facility] Hematology/Oncology Physician [facility]  (Office):       [phone] (Work cell):  [phone] (Fax):           [phone]

## 2016-03-01 ENCOUNTER — Encounter: Payer: Self-pay | Admitting: Endocrinology

## 2016-03-25 NOTE — Patient Instructions (Addendum)
check your blood sugar 5 times a day: before the 3 meals, at bedtime, and as needed.  also check if you have symptoms of your blood sugar being too high or too low.  please keep a record of the readings and bring it to your next appointment here.  You can write it on any piece of paper.  please call us sooner if your blood sugar goes below 70, or if you have a lot of readings over 200. blood tests are requested for you today.  We'll let you know about the results. For now:  continue basal rate of 0.9 unit/hr, 24 hrs per day.   continue mealtime bolus of 1 unit/6 grams carbohydrate.  continue correction bolus (which some people call "sensitivity," or "insulin sensitivity ratio," or just "isr") of 1 unit for each 35 by which your glucose exceeds 120. Please come back for a follow-up appointment in 3 months.

## 2016-03-25 NOTE — Progress Notes (Signed)
Subjective:    Patient ID: Cynthia Moody, female    DOB: May 19, 1971, 45 y.o.   MRN: [id number]  HPI Pt returns for f/u of diabetes mellitus: DM type: 1 Dx'ed: [id number] Complications: polyneuropathy and retinopathy Therapy: insulin since 2000.  GDM: never DKA: once, in 2012 Severe hypoglycemia: never. Pancreatitis: never. Other: she has a paradigm insulin pump; (she also had a continuous glucose monitor, but stopped, because she did not like it). Interval history:  She takes these settings.  basal rate of 0.9 unit/hr. mealtime bolus of 1 unit/6 grams carbohydrate.   continue correction bolus (which some people call "sensitivity," or "insulin sensitivity ratio," or just "isr") of 1 unit for each 35 by which glucose exceeds 120.  She has lost weight, due to her efforts. no cbg record, but states cbg's are mildly low almost daily.  This happens at any time of day.  She says cbg's are much lower over the past few weeks, due to her renewed dietary effort.  She takes approx 70 total units/day.  Past Medical History  Diagnosis Date  . Dyslipidemia   . GERD (gastroesophageal reflux disease)   . Depression   . Diabetic retinopathy ([place])     bilateral  . Anemia   . Shoulder pain, bilateral     wakes up frequently due to pain  . Diabetes mellitus type 1 ([place])   . Diabetic neuropathy (HCC)     feet  . Post-nasal drip 01/27/2016  . Stuffy nose 01/27/2016  . Cough 01/27/2016  . Acne   . Trigger finger of right hand 01/2016    index finger    Past Surgical History  Procedure Laterality Date  . Cesarean section  01/17/1995  . Shoulder surgery Left 06/29/2013    exc. bone spur  . Trigger finger release Right 02/02/2016    Procedure: RIGHT INDEX RELEASE TRIGGER  ;  Surgeon: Leanora Cover, MD;  Location: [facility];  Service: Orthopedics;  Laterality: Right;    Social History   Social History  . Marital Status: Married    Spouse Name: N/A  . Number of Children: N/A  . Years of  Education: N/A   Occupational History  . Not on file.   Social History Main Topics  . Smoking status: Never Smoker   . Smokeless tobacco: Never Used  . Alcohol Use: 0.0 oz/week    0 Standard drinks or equivalent per week     Comment: occasionally  . Drug Use: No  . Sexual Activity: Yes   Other Topics Concern  . Not on file   Social History Narrative    Current Outpatient Prescriptions on File Prior to Visit  Medication Sig Dispense Refill  . acetaminophen (TYLENOL) 650 MG CR tablet Take 650 mg by mouth as needed for pain.    [name] Kitchen b complex vitamins tablet Take 1 tablet by mouth daily.    . cholecalciferol (VITAMIN D) 1000 UNITS tablet Take 1,000 Units by mouth daily.    [name] Kitchen HYDROcodone-acetaminophen (NORCO) 5-325 MG tablet 1-2 tabs po q6 hours prn pain 30 tablet 0  . ibuprofen (ADVIL) 200 MG tablet 1 tablet as needed    . Insulin Infusion Pump Supplies (PARADIGM QUICK-SET 32" 6MM) MISC 1 Device by Does not apply route every 3 (three) days.    . Insulin Infusion Pump Supplies (PARADIGM RESERVOIR 3ML) MISC 1 Device by Does not apply route every 3 (three) days.    [name] Kitchen omeprazole (PRILOSEC) 20 MG capsule Take 20  mg by mouth 2 (two) times daily before a meal.     . simvastatin (ZOCOR) 20 MG tablet Take 20 mg by mouth daily.     Current Facility-Administered Medications on File Prior to Visit  Medication Dose Route Frequency Provider Last Rate Last Dose  . heparin lock flush 100 unit/mL  500 Units Intracatheter Once PRN Brunetta Genera, MD      . sodium chloride 0.9 % injection 10 mL  10 mL Intracatheter PRN Gautam Juleen China, MD        Allergies  Allergen Reactions  . Lipitor [Atorvastatin] Swelling    LEGS    Family History  Problem Relation Age of Onset  . Diabetes Mother   . Heart attack Mother     Age 72  . Heart attack Father     Age 38    BP 122/70 mmHg  Pulse 84  Temp(Src) 98 F (36.7 C) (Oral)  Ht 5\' 9"  (1.753 m)  Wt 207 lb (93.895 kg)  BMI 30.55 kg/m2   SpO2 97%  Review of Systems Denies LOC.     Objective:   Physical Exam VITAL SIGNS:  See vs page. GENERAL: no distress. Pulses: dorsalis pedis intact bilat.   MSK: no deformity of the feet.  CV: 1+ bilat leg edema.  Skin:  no ulcer on the feet.  normal color and temp on the feet. Neuro: sensation is intact to touch on the feet.      A1c=8.8%    Assessment & Plan:  DM: she needs increased rx, but pt says this a1c does not reflect recent improvement in glycemic control.  We'll check fructosamine.   Obesity: slightly better.   Hypoglycemia, prob due to her renewed dietary efforts.  She prob needs pump settings reduced, but we should see fructosamine result first.   Patient is advised the following: Patient Instructions  check your blood sugar 5 times a day: before the 3 meals, at bedtime, and as needed.  also check if you have symptoms of your blood sugar being too high or too low.  please keep a record of the readings and bring it to your next appointment here.  You can write it on any piece of paper.  please call us sooner if your blood sugar goes below 70, or if you have a lot of readings over 200. blood tests are requested for you today.  We'll let you know about the results. For now:  continue basal rate of 0.9 unit/hr, 24 hrs per day.   continue mealtime bolus of 1 unit/6 grams carbohydrate.  continue correction bolus (which some people call "sensitivity," or "insulin sensitivity ratio," or just "isr") of 1 unit for each 35 by which your glucose exceeds 120. Please come back for a follow-up appointment in 3 months.

## 2016-03-27 ENCOUNTER — Encounter: Payer: BLUE CROSS/BLUE SHIELD | Attending: Endocrinology | Admitting: Nutrition

## 2016-03-27 ENCOUNTER — Ambulatory Visit (INDEPENDENT_AMBULATORY_CARE_PROVIDER_SITE_OTHER): Payer: BLUE CROSS/BLUE SHIELD | Admitting: Endocrinology

## 2016-03-27 ENCOUNTER — Encounter: Payer: Self-pay | Admitting: Endocrinology

## 2016-03-27 VITALS — BP 122/70 | HR 84 | Temp 98.0°F | Ht 69.0 in | Wt 207.0 lb

## 2016-03-27 DIAGNOSIS — E10319 Type 1 diabetes mellitus with unspecified diabetic retinopathy without macular edema: Secondary | ICD-10-CM | POA: Insufficient documentation

## 2016-03-27 DIAGNOSIS — E1065 Type 1 diabetes mellitus with hyperglycemia: Secondary | ICD-10-CM

## 2016-03-27 LAB — POCT GLYCOSYLATED HEMOGLOBIN (HGB A1C): HEMOGLOBIN A1C: 8.8

## 2016-03-27 LAB — GLUCOSE, POCT (MANUAL RESULT ENTRY): POC Glucose: 86 mg/dl (ref 70–99)

## 2016-03-27 MED ORDER — INSULIN LISPRO 100 UNIT/ML ~~LOC~~ SOLN: SUBCUTANEOUS | Status: AC

## 2016-03-27 NOTE — Progress Notes (Signed)
Patient reports starting Weight Watchers and blood sugars always dropping low--both in the early morning and late afternoon.  Says weight is down 5 pounds now.  She is very frustrated because she is needing to eat, and is using all of her points. Basal rate 1.0u/hr.  Changes this to 0.9u/hr and told to call if blood sugars start dropping 1-4 hours after meals.  Told her that we will need to change the I/C ratio if this happens.  She reported good understanding of this and agreed to call.  Discussed the importance of exercise for weight loss, and what to do with the pump settings for this.  She reported good understanding of this.  She will reduce the pre exercise bolus by 2 units.   She had no final questions.

## 2016-03-27 NOTE — Patient Instructions (Signed)
Exercise for 30 min. Daily -5-6 days/wk.

## 2016-03-28 ENCOUNTER — Telehealth: Payer: Self-pay | Admitting: Endocrinology

## 2016-03-28 NOTE — Telephone Encounter (Signed)
Patient stated that her pharmacy at [facility]  haven't received her medication Humalog please send to the  Attention of Altha Harm, phone # [phone] ext [phone] Fax# [phone]

## 2016-03-28 NOTE — Telephone Encounter (Signed)
Rx resent.

## 2016-04-24 ENCOUNTER — Encounter: Payer: Self-pay | Admitting: Endocrinology

## 2016-04-25 ENCOUNTER — Other Ambulatory Visit: Payer: Self-pay

## 2016-04-25 MED ORDER — GLUCOSE BLOOD VI STRP: ORAL_STRIP | Status: DC

## 2016-05-07 ENCOUNTER — Encounter: Payer: Self-pay | Admitting: Internal Medicine

## 2016-05-07 ENCOUNTER — Ambulatory Visit (INDEPENDENT_AMBULATORY_CARE_PROVIDER_SITE_OTHER): Payer: BLUE CROSS/BLUE SHIELD | Admitting: Internal Medicine

## 2016-05-07 DIAGNOSIS — E103391 Type 1 diabetes mellitus with moderate nonproliferative diabetic retinopathy without macular edema, right eye: Secondary | ICD-10-CM

## 2016-05-07 DIAGNOSIS — E10339 Type 1 diabetes mellitus with moderate nonproliferative diabetic retinopathy without macular edema: Secondary | ICD-10-CM

## 2016-05-07 DIAGNOSIS — E1139 Type 2 diabetes mellitus with other diabetic ophthalmic complication: Secondary | ICD-10-CM | POA: Insufficient documentation

## 2016-05-07 NOTE — Patient Instructions (Signed)
Please try to eat meals around the same time every day and bolus with all of them.   Start the bolus 15 min before meals.   Please continue the following pump settings: - basal rates: 12 am: 0.9 units/h - ICR: 8 - target: 120 - ISF: 35 - Insulin on Board: 4h - bolus wizard: on  Please call and schedule an eye appt with Dr. Prudencio Burly: [facility]:  Dr. Sherlyn Lick MD ?  Address: [address], [place], [place] [address]  [phone]  Please return in 1.5 months.

## 2016-05-07 NOTE — Progress Notes (Signed)
Patient ID: Cynthia Moody, female   DOB: Apr 23, 1971, 45 y.o.   MRN: [id number]  HPI: Cynthia Moody is a 45 y.o.-year-old female, referred by her PCP, Dr. Kelton Pillar, for management of DM1, dx 1998, on insulin since 2000, uncontrolled, with complications (moderate nonproliferative diabetic retinopathy - OU, peripheral neuropathy).  She has been previously seen by Dr. Loanne Drilling, last visit 03/2016. Before that, she saw Dr Altheimer.  Last hemoglobin A1c was: Lab Results  Component Value Date   HGBA1C 8.8 03/27/2016   HGBA1C 8.8 10/17/2015   Pt is on an insulin pump: Medtronic Paradigm 530G (started 12/2012, replaced 09/2013), without CGM, uses Humalog in the pump. She had a CGM, but did not like using it.  Pump settings: - basal rates: 12 am: 1.0 >> 0.9 units/h TDD from basal insulin: 45% - ICR: 6 >> 8 - target: 120 - ISF: 35 - Insulin on Board: 4h - bolus wizard: on TDD from bolus insulin: 55% - extended bolusing: not using - changes infusion site: q3-5 days - Meter: [employer]  She has also seen Leonia Reader with diabetes education.  Pt checks her sugars 2.6 a day and they are - ave 238 +/- 128:   Lowest sugar was 69; she has hypoglycemia awareness at 80. No previous hypoglycemia admission. She does not have a glucagon kit at home (does not want one as she never had to use it). Highest sugar was 589. + 3x previous DKA admissions (assoc. with PNA/bronchitis).  She was on a weight watchers diet >> stopped as she could not count both carbs and points.   Pt's meals are: - Breakfast: bagel + cream cheese + hot tea; oatmeal in wintertime (60 g carbs) - Lunch: Lean cuisine + ice tea or water or gatorade - Dinner: skips or sandwich  - Snacks: granola bars, goldfish, pudding, fruit cups Regular sodas once a week.  - no CKD, last BUN/creatinine:  Lab Results  Component Value Date   BUN 10.4 02/22/2016   CREATININE 1.1 02/22/2016   - last set of lipids: No results found  for: CHOL, HDL, LDLCALC, LDLDIRECT, TRIG, CHOLHDL  Stopped Simvastatin in 2015. - last eye exam was in 2016. + DR.  - + numbness and tingling in her feet and some in fingers  Last TSH: Lab Results  Component Value Date   TSH 0.519 02/22/2016   Pt has FH of DM in in Mother, MGM.  ROS: Constitutional: no weight gain/loss, no fatigue, no subjective hyperthermia/hypothermia Eyes: no blurry vision, no xerophthalmia ENT: no sore throat, no nodules palpated in throat, no dysphagia/odynophagia, no hoarseness Cardiovascular: no CP/SOB/palpitations/leg swelling Respiratory: no cough/SOB Gastrointestinal: no N/V/D/C Musculoskeletal: no muscle/joint aches Skin: no rashes Neurological: no tremors/numbness/tingling/dizziness Psychiatric: no depression/anxiety  Past Medical History  Diagnosis Date  . Dyslipidemia   . GERD (gastroesophageal reflux disease)   . Depression   . Diabetic retinopathy ([place])     bilateral  . Anemia   . Shoulder pain, bilateral     wakes up frequently due to pain  . Diabetes mellitus type 1 ([place])   . Diabetic neuropathy (HCC)     feet  . Post-nasal drip 01/27/2016  . Stuffy nose 01/27/2016  . Cough 01/27/2016  . Acne   . Trigger finger of right hand 01/2016    index finger   Past Surgical History  Procedure Laterality Date  . Cesarean section  01/17/1995  . Shoulder surgery Left 06/29/2013    exc. bone spur  . Trigger  finger release Right 02/02/2016    Procedure: RIGHT INDEX RELEASE TRIGGER  ;  Surgeon: Leanora Cover, MD;  Location: [facility];  Service: Orthopedics;  Laterality: Right;   Social History   Social History  . Marital Status: Married    Spouse Name: N/A  . Number of Children: N/A  . Years of Education: N/A   Occupational History  . Not on file.   Social History Main Topics  . Smoking status: Never Smoker   . Smokeless tobacco: Never Used  . Alcohol Use: 0.0 oz/week    0 Standard drinks or equivalent per week     Comment:  occasionally  . Drug Use: No  . Sexual Activity: Yes   Other Topics Concern  . Not on file   Social History Narrative   Current Outpatient Prescriptions on File Prior to Visit  Medication Sig Dispense Refill  . acetaminophen (TYLENOL) 650 MG CR tablet Take 650 mg by mouth as needed for pain.    [name] Kitchen b complex vitamins tablet Take 1 tablet by mouth daily.    . cholecalciferol (VITAMIN D) 1000 UNITS tablet Take 1,000 Units by mouth daily.    [name] Kitchen glucose blood (BAYER CONTOUR NEXT TEST) test strip Use to check blood sugar 4 times per day. 400 each 3  . HYDROcodone-acetaminophen (NORCO) 5-325 MG tablet 1-2 tabs po q6 hours prn pain 30 tablet 0  . ibuprofen (ADVIL) 200 MG tablet 1 tablet as needed    . Insulin Infusion Pump Supplies (PARADIGM QUICK-SET 32" 6MM) MISC 1 Device by Does not apply route every 3 (three) days.    . Insulin Infusion Pump Supplies (PARADIGM RESERVOIR 3ML) MISC 1 Device by Does not apply route every 3 (three) days.    . insulin lispro (HUMALOG) 100 UNIT/ML injection VIA INSULIN PUMP, total of 90 units/day 90 mL 3  . omeprazole (PRILOSEC) 20 MG capsule Take 20 mg by mouth 2 (two) times daily before a meal.     . simvastatin (ZOCOR) 20 MG tablet Take 20 mg by mouth daily. Reported on 05/07/2016     Current Facility-Administered Medications on File Prior to Visit  Medication Dose Route Frequency Provider Last Rate Last Dose  . heparin lock flush 100 unit/mL  500 Units Intracatheter Once PRN Brunetta Genera, MD      . sodium chloride 0.9 % injection 10 mL  10 mL Intracatheter PRN Girard, MD       Allergies  Allergen Reactions  . Lipitor [Atorvastatin] Swelling    LEGS  . Simvastatin Other (See Comments)    Severe leg cramps   Family History  Problem Relation Age of Onset  . Diabetes Mother   . Heart attack Mother     Age 4  . Heart attack Father     Age 31    PE: BP 120/78 mmHg  Pulse 88  Temp(Src) 98.8 F (37.1 C) (Oral)  Ht _0  (1.753 m)   Wt 208 lb 8 oz (94.575 kg)  BMI 30.78 kg/m2  LMP 05/01/2016 Wt Readings from Last 3 Encounters:  05/07/16 208 lb 8 oz (94.575 kg)  03/27/16 207 lb (93.895 kg)  02/22/16 209 lb 14.4 oz (95.21 kg)   Constitutional: overweight, in NAD Eyes: PERRLA, EOMI, no exophthalmos ENT: moist mucous membranes, no thyromegaly, no cervical lymphadenopathy Cardiovascular: RRR, No MRG Respiratory: CTA B Gastrointestinal: abdomen soft, NT, ND, BS+ Musculoskeletal: no deformities, strength intact in all 4 Skin: moist, warm, no rashes Neurological:  no tremor with outstretched hands, DTR normal in all 4  ASSESSMENT: 1. DM1, uncontrolled, with complications - moderate nonproliferative diabetic retinopathy, OU - s/p Laser sx.'s - peripheral neuropathy  PLAN:  1. Patient with long-standing, uncontrolled DM1, on insulin pump therapy. Her sugars are very fluctuating, from 60s to 500s.  Problems identified:  - She has inconsistent meal times and activity levels. On Tuesdays and Thursdays she works from home. The rest of the week she goes to work. - She changes her pump site every 3-5 days. I strongly advised her to start changing it every 3 days, to avoid scar tissue and inconsistent insulin release from the injection site with subsequent hypo-or hyperglycemia beyond the third day. She tells me that she did notice an increase in variability beyond the third day.  - She does not enter all carbs in the pump and I strongly advised her to start doing so - She overcorrects her lows >> we discussed at length to try to only take 15 g of carbs and wait 15 minutes before rechecking and before supplementing more carbs  - she may skip boluses-has days with only one bolus per day >> strongly advised her to bolus before each meal - She does not do square or dual wave boluses >>  advised about what these are and when to use them - she did not do basal or insulin to carb ratio testing >> advised how to do the latter   At  this visit, we discussed about the new pump, and she is interested in getting one. We will try to obtain the new pump when her current pump runs out of warranty.  - We discussed about  continuing the current settings of her pump until she starts having consistent mealtimes and bolusing with all the meals. Only after this, we can start making changes in her settings: Patient Instructions  Please try to eat meals around the same time every day and bolus with all of them.   Start the bolus 15 min before meals.   Please continue the following pump settings: - basal rates: 12 am: 0.9 units/h - ICR: 8 - target: 120 - ISF: 35 - Insulin on Board: 4h - bolus wizard: on  Please call and schedule an eye appt with Dr. Randon Goldsmith: [facility]:  Dr. Jeni Salles MD ?  Address: [address] [place], [place], [place] [address]  [phone]  Please return in 1.5 months.  - Strongly advised her to start checking sugars before each meal and at bedtime- check at least 4 times a day - Needs PA for Bayer Contour Strips  - given foot care handout and explained the principles  - given instructions for hypoglycemia management "15-15 rule"  - advised for yearly eye exams >> she needs a new ophthalmologist locally >> recommended Dr. Randon Goldsmith - she does not have a glucagon at home, but mentions that she never needed them in the past and they are expensive and would not like to get 1 - advised to always have Glu tablets with her - at next visit, may need to  refer to DM education for further help with the pump: carb counting check, basal rate validation, extended bolusing, sick days rules, etc. - no signs of other autoimmune disorders - last TSH normal recently  - Return to clinic in 1.5 mo with sugar log   - time spent with the patient: 1 hour, of which >50% was spent in obtaining information about her  disease, reviewing previous labs, office visit notes, pump downloads and insulin regimen,  counseling pt about her condition (please see the discussed topics above), and developing a plan to prevent further hypoglycemia and hyperglycemia. We also discussed about proper diet. Pt had a number of questions which I addressed.

## 2016-05-10 ENCOUNTER — Encounter: Payer: Self-pay | Admitting: Internal Medicine

## 2016-05-14 NOTE — Telephone Encounter (Signed)
[place] from [employer] called stated patient need a PA on glucose blood (BAYER CONTOUR NEXT TEST) test strip.. Have patient tried one touch meter or the Accu check meter. Please advise [phone]

## 2016-06-25 ENCOUNTER — Ambulatory Visit: Payer: BLUE CROSS/BLUE SHIELD | Admitting: Internal Medicine

## 2016-06-25 DIAGNOSIS — Z0289 Encounter for other administrative examinations: Secondary | ICD-10-CM

## 2016-08-24 ENCOUNTER — Ambulatory Visit (HOSPITAL_BASED_OUTPATIENT_CLINIC_OR_DEPARTMENT_OTHER): Payer: BLUE CROSS/BLUE SHIELD | Admitting: Hematology

## 2016-08-24 ENCOUNTER — Other Ambulatory Visit (HOSPITAL_BASED_OUTPATIENT_CLINIC_OR_DEPARTMENT_OTHER): Payer: BLUE CROSS/BLUE SHIELD

## 2016-08-24 ENCOUNTER — Telehealth: Payer: Self-pay | Admitting: Hematology

## 2016-08-24 ENCOUNTER — Encounter: Payer: Self-pay | Admitting: Hematology

## 2016-08-24 VITALS — BP 129/73 | HR 94 | Temp 98.8°F | Resp 18 | Ht 69.0 in | Wt 209.8 lb

## 2016-08-24 DIAGNOSIS — D509 Iron deficiency anemia, unspecified: Secondary | ICD-10-CM

## 2016-08-24 LAB — COMPREHENSIVE METABOLIC PANEL
ALT: <9 U/L (ref 0–55)
ANION GAP: 11 meq/L (ref 3–11)
AST: 12 U/L (ref 5–34)
Albumin: 3.5 g/dL (ref 3.5–5.0)
Alkaline Phosphatase: 109 U/L (ref 40–150)
BUN: 14.6 mg/dL (ref 7.0–26.0)
CALCIUM: 9.3 mg/dL (ref 8.4–10.4)
CHLORIDE: 105 meq/L (ref 98–109)
CO2: 23 meq/L (ref 22–29)
CREATININE: 1.1 mg/dL (ref 0.6–1.1)
EGFR: 61 mL/min/{1.73_m2} — AB (ref >90)
Glucose: 227 mg/dl — ABNORMAL HIGH (ref 70–140)
POTASSIUM: 3.9 meq/L (ref 3.5–5.1)
Sodium: 140 mEq/L (ref 136–145)
Total Bilirubin: <0.3 mg/dL (ref 0.20–1.20)
Total Protein: 7 g/dL (ref 6.4–8.3)

## 2016-08-24 LAB — CBC & DIFF AND RETIC
BASO%: 0.6 % (ref 0.0–2.0)
BASOS ABS: 0 10*3/uL (ref 0.0–0.1)
EOS%: 1.8 % (ref 0.0–7.0)
Eosinophils Absolute: 0.1 10*3/uL (ref 0.0–0.5)
HEMATOCRIT: 37.8 % (ref 34.8–46.6)
HGB: 12.6 g/dL (ref 11.6–15.9)
Immature Retic Fract: 6 % (ref 1.60–10.00)
LYMPH#: 2 10*3/uL (ref 0.9–3.3)
LYMPH%: 27.7 % (ref 14.0–49.7)
MCH: 27.2 pg (ref 25.1–34.0)
MCHC: 33.3 g/dL (ref 31.5–36.0)
MCV: 81.6 fL (ref 79.5–101.0)
MONO#: 0.6 10*3/uL (ref 0.1–0.9)
MONO%: 7.8 % (ref 0.0–14.0)
NEUT#: 4.4 10*3/uL (ref 1.5–6.5)
NEUT%: 62.1 % (ref 38.4–76.8)
PLATELETS: 376 10*3/uL (ref 145–400)
RBC: 4.63 10*6/uL (ref 3.70–5.45)
RDW: 13.3 % (ref 11.2–14.5)
Retic %: 1.53 % (ref 0.70–2.10)
Retic Ct Abs: 70.84 10*3/uL (ref 33.70–90.70)
WBC: 7.1 10*3/uL (ref 3.9–10.3)

## 2016-08-24 LAB — IRON AND TIBC
%SAT: 8 % — AB (ref 21–57)
IRON: 29 ug/dL — AB (ref 41–142)
TIBC: 348 ug/dL (ref 236–444)
UIBC: 319 ug/dL (ref 120–384)

## 2016-08-24 LAB — FERRITIN: FERRITIN: 8 ng/mL — AB (ref 9–269)

## 2016-08-24 NOTE — Telephone Encounter (Signed)
Gave patient avs report and appointments for March  °

## 2016-08-24 NOTE — Patient Instructions (Signed)
-   Take iron polysaccharide 100-150mg  po daily Over the counter. -Could try to start with iron polysaccharide 50 mg by mouth daily to see if you tolerate it. Novaferrum is one of the preparations available that is dye free/preservative free (might be available online) -that tends to be tolerated well. You could take it with some orange juice to help with absorption.

## 2016-09-10 NOTE — Progress Notes (Signed)
Cynthia Moody  HEMATOLOGY ONCOLOGY PROGRESS NOTE  Date of service: .08/24/2016  PCP: Dr Renato Shin  CC: f/u for Iron deficiency anemia   Diagnosis: Iron deficiency Anemia  Current Treatment: IV feraheme as needed  Received IV feraheme 510 mg in 08/2015  INTERVAL HISTORY:   [name] is here for follow-up for iron deficiency anemia. She notes her energy levels are down a little since last visit.  No overt GI bleeding,hematuria, hemoptysis or epistaxis.  She notes that she has stopped using a lot of tea to help control her acid reflux so that she could back of from using PPI's and instead is using ranitidine as needed. No other acute new symptoms.  REVIEW OF SYSTEMS:    10 Point review of systems of done and is negative except as noted above.  . Past Medical History:  Diagnosis Date  . Acne   . Anemia   . Cough 01/27/2016  . Depression   . Diabetes mellitus type 1 ([place])   . Diabetic neuropathy (HCC)    feet  . Diabetic retinopathy ([place])    bilateral  . Dyslipidemia   . GERD (gastroesophageal reflux disease)   . Post-nasal drip 01/27/2016  . Shoulder pain, bilateral    wakes up frequently due to pain  . Stuffy nose 01/27/2016  . Trigger finger of right hand 01/2016   index finger    . Past Surgical History:  Procedure Laterality Date  . CESAREAN SECTION  01/17/1995  . SHOULDER SURGERY Left 06/29/2013   exc. bone spur  . TRIGGER FINGER RELEASE Right 02/02/2016   Procedure: RIGHT INDEX RELEASE TRIGGER  ;  Surgeon: Leanora Cover, MD;  Location: [facility];  Service: Orthopedics;  Laterality: Right;    . Social History  Substance Use Topics  . Smoking status: Never Smoker  . Smokeless tobacco: Never Used  . Alcohol use 0.0 oz/week     Comment: occasionally    ALLERGIES:  is allergic to lipitor [atorvastatin] and simvastatin.  MEDICATIONS:  Current Outpatient Prescriptions  Medication Sig Dispense Refill  . ranitidine (ZANTAC) 150 MG capsule Take 150 mg by mouth  2 (two) times daily.    Cynthia Moody acetaminophen (TYLENOL) 650 MG CR tablet Take 650 mg by mouth as needed for pain.    Cynthia Moody aspirin 500 MG tablet Take 500 mg by mouth every 6 (six) hours as needed for pain.    Cynthia Moody b complex vitamins tablet Take 1 tablet by mouth daily.    . cholecalciferol (VITAMIN D) 1000 UNITS tablet Take 1,000 Units by mouth daily.    Cynthia Moody glucose blood (BAYER CONTOUR NEXT TEST) test strip Use to check blood sugar 4 times per day. 400 each 3  . HYDROcodone-acetaminophen (NORCO) 5-325 MG tablet 1-2 tabs po q6 hours prn pain 30 tablet 0  . ibuprofen (ADVIL) 200 MG tablet 1 tablet as needed    . Insulin Infusion Pump Supplies (PARADIGM QUICK-SET 32" 6MM) MISC 1 Device by Does not apply route every 3 (three) days.    . Insulin Infusion Pump Supplies (PARADIGM RESERVOIR 3ML) MISC 1 Device by Does not apply route every 3 (three) days.    . insulin lispro (HUMALOG) 100 UNIT/ML injection VIA INSULIN PUMP, total of 90 units/day 90 mL 3   No current facility-administered medications for this visit.    Facility-Administered Medications Ordered in Other Visits  Medication Dose Route Frequency Provider Last Rate Last Dose  . heparin lock flush 100 unit/mL  500 Units Intracatheter Once  PRN Brunetta Genera, MD      . sodium chloride 0.9 % injection 10 mL  10 mL Intracatheter PRN Gautam Juleen China, MD        PHYSICAL EXAMINATION: ECOG PERFORMANCE STATUS: 1 - Symptomatic but completely ambulatory  . Vitals:   08/24/16 0831  BP: 129/73  Pulse: 94  Resp: 18  Temp: 98.8 F (37.1 C)    Filed Weights   08/24/16 0831  Weight: 209 lb 12.8 oz (95.2 kg)   .Body mass index is 30.98 kg/m.  GENERAL:alert, in no acute distress and comfortable SKIN: skin color, texture, turgor are normal, no rashes or significant lesions EYES: normal, conjunctiva are pink and non-injected, sclera clear OROPHARYNX:no exudate, no erythema and lips, buccal mucosa, and tongue normal  NECK: supple, no JVD, thyroid  normal size, non-tender, without nodularity LYMPH:  no palpable lymphadenopathy in the cervical, axillary or inguinal LUNGS: clear to auscultation with normal respiratory effort HEART: regular rate & rhythm,  no murmurs and no lower extremity edema ABDOMEN: abdomen soft, non-tender, normoactive bowel sounds  Musculoskeletal: no cyanosis of digits and no clubbing  PSYCH: alert & oriented x 3 with fluent speech NEURO: no focal motor/sensory deficits  LABORATORY DATA:   I have reviewed the data as listed  . CBC Latest Ref Rng & Units 08/24/2016 02/22/2016 10/25/2015  WBC 3.9 - 10.3 10e3/uL 7.1 6.3 6.4  Hemoglobin 11.6 - 15.9 g/dL 12.6 12.8 12.4  Hematocrit 34.8 - 46.6 % 37.8 37.4 37.3  Platelets 145 - 400 10e3/uL 376 357 402(H)   . CBC    Component Value Date/Time   WBC 7.1 08/24/2016 0820   RBC 4.63 08/24/2016 0820   HGB 12.6 08/24/2016 0820   HCT 37.8 08/24/2016 0820   PLT 376 08/24/2016 0820   MCV 81.6 08/24/2016 0820   MCH 27.2 08/24/2016 0820   MCHC 33.3 08/24/2016 0820   RDW 13.3 08/24/2016 0820   LYMPHSABS 2.0 08/24/2016 0820   MONOABS 0.6 08/24/2016 0820   EOSABS 0.1 08/24/2016 0820   BASOSABS 0.0 08/24/2016 0820    . CMP Latest Ref Rng & Units 08/24/2016 02/22/2016 01/31/2016  Glucose 70 - 140 mg/dl 227(H) 321(H) 218(H)  BUN 7.0 - 26.0 mg/dL 14.6 10.4 6  Creatinine 0.6 - 1.1 mg/dL 1.1 1.1 0.88  Sodium 136 - 145 mEq/L 140 137 138  Potassium 3.5 - 5.1 mEq/L 3.9 4.3 4.1  Chloride 101 - 111 mmol/L - - 100(L)  CO2 22 - 29 mEq/L 23 28 23   Calcium 8.4 - 10.4 mg/dL 9.3 9.2 9.5  Total Protein 6.4 - 8.3 g/dL 7.0 7.3 -  Total Bilirubin 0.20 - 1.20 mg/dL <0.30 0.35 -  Alkaline Phos 40 - 150 U/L 109 93 -  AST 5 - 34 U/L 12 18 -  ALT 0 - 55 U/L <9 13 -   . Lab Results  Component Value Date   IRON 29 (L) 08/24/2016   TIBC 348 08/24/2016   IRONPCTSAT 8 (L) 08/24/2016   (Iron and TIBC)  Lab Results  Component Value Date   FERRITIN 8 (L) 08/24/2016    RADIOGRAPHIC  STUDIES: I have personally reviewed the radiological images as listed and agreed with the findings in the report.  ASSESSMENT & PLAN:   #1 Severe iron deficiency with minimal anemia. Ferritin 8, iron saturation 7% pretreatment prior to last visit. S/p IV feraheme x 2 doses Chronic PPI therapy is certainly an additional risk factor for developing iron deficiency. Menstrual losses on the  heavier side but fairly regular. Tolerated IV iron well in the past.  Her CBC today shows no significant anemia but her ferritin levels have dropped back down to 8 with 8 percent iron saturation Suggesting ongoing blood loss in excess of iron absorption.  #2 intolerance to oral iron  Plan -Primary care physician to consider GYN referral to evaluate heavier menstrual bleeding and to consider GI evaluation for recurrent iron deficiency -Consider additional lifestyle modifications for GERD(patient has been doing these) and has weaned off PPI's and has switched to ranitidine for her GERD at this time. -I shall have my nurse call the patient with her ferritin results and offer to treat her with an additional 2 doses of IV feraheme 510 mg every weekly  #2 Oropharyngeal dysphagia - stable to somewhat better. CT of the neck done which showed no evidence of overt pathology or thyromegaly. Plan -Follow-up with primary care physician for further workup of her oropharyngeal dysphagia as needed. -next Steps would be evaluation by ENT.  #3 obesity and diabetes. -Patient has joined [employer] program -Encouraged to control her diabetes better to help with her energy levels.  Return to care in 6 months for repeat CBC, ferritin, iron profile with Dr. Irene Moody.  I spent 15 minutes counseling the patient face to face. The total time spent in the appointment was 20 minutes and more than 50% was on counseling and direct patient cares.    Sullivan Lone MD [place] AAHIVMS [facility] Hematology/Oncology Physician [facility]  (Office):       [phone] (Work cell):  [phone] (Fax):           [phone]

## 2016-09-11 ENCOUNTER — Telehealth: Payer: Self-pay | Admitting: Hematology

## 2016-09-11 ENCOUNTER — Telehealth: Payer: Self-pay | Admitting: *Deleted

## 2016-09-11 NOTE — Telephone Encounter (Signed)
Spoke with pt to confirm 9/22 appt per LOS °

## 2016-09-11 NOTE — Telephone Encounter (Signed)
"  I'm returning a call.  Not sure why the nurse said she could nit reach me.  We have disconnected the home phone number doing only internet."  Call transferred ext [phone].  Deleted home phone number from demographics.

## 2016-09-14 ENCOUNTER — Ambulatory Visit (HOSPITAL_BASED_OUTPATIENT_CLINIC_OR_DEPARTMENT_OTHER): Payer: BLUE CROSS/BLUE SHIELD

## 2016-09-14 VITALS — BP 121/79 | HR 84 | Temp 99.1°F | Resp 18

## 2016-09-14 DIAGNOSIS — D509 Iron deficiency anemia, unspecified: Secondary | ICD-10-CM | POA: Diagnosis not present

## 2016-09-14 MED ORDER — FERUMOXYTOL INJECTION 510 MG/17 ML
510.0000 mg | Freq: Once | INTRAVENOUS | Status: AC
  Administered 2016-09-14: 510 mg via INTRAVENOUS
  Filled 2016-09-14: qty 17

## 2016-09-14 MED ORDER — SODIUM CHLORIDE 0.9 % IV SOLN
Freq: Once | INTRAVENOUS | Status: AC
  Administered 2016-09-14: 09:00:00 via INTRAVENOUS

## 2016-09-14 NOTE — Patient Instructions (Signed)

## 2016-09-21 ENCOUNTER — Ambulatory Visit (HOSPITAL_BASED_OUTPATIENT_CLINIC_OR_DEPARTMENT_OTHER): Payer: BLUE CROSS/BLUE SHIELD

## 2016-09-21 VITALS — BP 131/80 | HR 67 | Temp 99.0°F | Resp 18

## 2016-09-21 DIAGNOSIS — D509 Iron deficiency anemia, unspecified: Secondary | ICD-10-CM | POA: Diagnosis not present

## 2016-09-21 MED ORDER — SODIUM CHLORIDE 0.9 % IV SOLN
Freq: Once | INTRAVENOUS | Status: AC
  Administered 2016-09-21: 09:00:00 via INTRAVENOUS

## 2016-09-21 MED ORDER — FERUMOXYTOL INJECTION 510 MG/17 ML
510.0000 mg | Freq: Once | INTRAVENOUS | Status: AC
  Administered 2016-09-21: 510 mg via INTRAVENOUS
  Filled 2016-09-21: qty 17

## 2016-09-21 NOTE — Patient Instructions (Signed)

## 2016-12-05 LAB — HM DIABETES EYE EXAM

## 2016-12-07 DIAGNOSIS — Z6837 Body mass index (BMI) 37.0-37.9, adult: Secondary | ICD-10-CM | POA: Insufficient documentation

## 2016-12-07 DIAGNOSIS — E785 Hyperlipidemia, unspecified: Secondary | ICD-10-CM | POA: Insufficient documentation

## 2016-12-07 DIAGNOSIS — E1140 Type 2 diabetes mellitus with diabetic neuropathy, unspecified: Secondary | ICD-10-CM | POA: Insufficient documentation

## 2017-02-21 ENCOUNTER — Other Ambulatory Visit: Payer: BLUE CROSS/BLUE SHIELD

## 2017-02-22 ENCOUNTER — Ambulatory Visit: Payer: BLUE CROSS/BLUE SHIELD | Admitting: Hematology

## 2017-03-08 ENCOUNTER — Telehealth: Payer: Self-pay | Admitting: Hematology

## 2017-03-08 NOTE — Telephone Encounter (Signed)
Patient called to reschedule missed appointments. Dates selected by patient. 03/08/17

## 2017-04-01 ENCOUNTER — Other Ambulatory Visit (HOSPITAL_BASED_OUTPATIENT_CLINIC_OR_DEPARTMENT_OTHER): Payer: BLUE CROSS/BLUE SHIELD

## 2017-04-01 DIAGNOSIS — D509 Iron deficiency anemia, unspecified: Secondary | ICD-10-CM | POA: Diagnosis not present

## 2017-04-01 LAB — IRON AND TIBC
%SAT: 21 % (ref 21–57)
IRON: 62 ug/dL (ref 41–142)
TIBC: 298 ug/dL (ref 236–444)
UIBC: 236 ug/dL (ref 120–384)

## 2017-04-01 LAB — CBC & DIFF AND RETIC
BASO%: 0.4 % (ref 0.0–2.0)
Basophils Absolute: 0 10*3/uL (ref 0.0–0.1)
EOS ABS: 0.1 10*3/uL (ref 0.0–0.5)
EOS%: 1.9 % (ref 0.0–7.0)
HCT: 41 % (ref 34.8–46.6)
HEMOGLOBIN: 13.6 g/dL (ref 11.6–15.9)
IMMATURE RETIC FRACT: 3.5 % (ref 1.60–10.00)
LYMPH%: 29.7 % (ref 14.0–49.7)
MCH: 28.9 pg (ref 25.1–34.0)
MCHC: 33.2 g/dL (ref 31.5–36.0)
MCV: 87 fL (ref 79.5–101.0)
MONO#: 0.4 10*3/uL (ref 0.1–0.9)
MONO%: 5.6 % (ref 0.0–14.0)
NEUT%: 62.4 % (ref 38.4–76.8)
NEUTROS ABS: 4.3 10*3/uL (ref 1.5–6.5)
Platelets: 413 10*3/uL — ABNORMAL HIGH (ref 145–400)
RBC: 4.71 10*6/uL (ref 3.70–5.45)
RDW: 12.5 % (ref 11.2–14.5)
Retic %: 1.55 % (ref 0.70–2.10)
Retic Ct Abs: 73.01 10*3/uL (ref 33.70–90.70)
WBC: 6.9 10*3/uL (ref 3.9–10.3)
lymph#: 2.1 10*3/uL (ref 0.9–3.3)

## 2017-04-01 LAB — COMPREHENSIVE METABOLIC PANEL
ALK PHOS: 79 U/L (ref 40–150)
ALT: 11 U/L (ref 0–55)
AST: 14 U/L (ref 5–34)
Albumin: 3.9 g/dL (ref 3.5–5.0)
Anion Gap: 12 mEq/L — ABNORMAL HIGH (ref 3–11)
BUN: 11.3 mg/dL (ref 7.0–26.0)
CO2: 23 meq/L (ref 22–29)
Calcium: 9.1 mg/dL (ref 8.4–10.4)
Chloride: 104 mEq/L (ref 98–109)
Creatinine: 1 mg/dL (ref 0.6–1.1)
EGFR: 70 mL/min/{1.73_m2} — AB (ref >90)
GLUCOSE: 157 mg/dL — AB (ref 70–140)
POTASSIUM: 3.9 meq/L (ref 3.5–5.1)
SODIUM: 139 meq/L (ref 136–145)
Total Bilirubin: 0.45 mg/dL (ref 0.20–1.20)
Total Protein: 7 g/dL (ref 6.4–8.3)

## 2017-04-01 LAB — FERRITIN: Ferritin: 17 ng/ml (ref 9–269)

## 2017-04-04 ENCOUNTER — Ambulatory Visit (HOSPITAL_BASED_OUTPATIENT_CLINIC_OR_DEPARTMENT_OTHER): Payer: BLUE CROSS/BLUE SHIELD | Admitting: Hematology

## 2017-04-04 ENCOUNTER — Encounter: Payer: Self-pay | Admitting: Hematology

## 2017-04-04 VITALS — BP 120/67 | HR 81 | Temp 98.3°F | Resp 18 | Ht 69.0 in | Wt 200.2 lb

## 2017-04-04 DIAGNOSIS — D509 Iron deficiency anemia, unspecified: Secondary | ICD-10-CM

## 2017-04-04 MED ORDER — POLYSACCHARIDE IRON COMPLEX 15 MG/0.5ML PO LIQD: 150.0000 mg | Freq: Two times a day (BID) | ORAL | 3 refills | Status: DC

## 2017-04-04 NOTE — Progress Notes (Signed)
Cynthia Moody  HEMATOLOGY ONCOLOGY PROGRESS NOTE  Date of service: .04/04/2017  PCP: Dr Renato Shin  CC: f/u for Iron deficiency anemia   Diagnosis: Iron deficiency Anemia  Current Treatment: IV feraheme as needed  Received IV feraheme 510 mg in 08/2015  INTERVAL HISTORY:   [name] is here for follow-up for iron deficiency anemia. She notes her energy levels are slowly decreasing since her last visit. Hemoglobin today is within normal limits at 13.6 with normocytic RBC indices and normal RDW.  Ferritin was 17 and iron saturation of 21%. We discussed that her fatigue is likely multifactorial from her diabetes and work-related stress. No overt GI bleeding,hematuria, hemoptysis or epistaxis.   She is willing to try oral iron . Given prescription for liquid iron polysaccharide . No other acute new symptoms.  REVIEW OF SYSTEMS:    10 Point review of systems of done and is negative except as noted above.  . Past Medical History:  Diagnosis Date  . Acne   . Anemia   . Cough 01/27/2016  . Depression   . Diabetes mellitus type 1 ([place])   . Diabetic neuropathy (HCC)    feet  . Diabetic retinopathy ([place])    bilateral  . Dyslipidemia   . GERD (gastroesophageal reflux disease)   . Post-nasal drip 01/27/2016  . Shoulder pain, bilateral    wakes up frequently due to pain  . Stuffy nose 01/27/2016  . Trigger finger of right hand 01/2016   index finger    . Past Surgical History:  Procedure Laterality Date  . CESAREAN SECTION  01/17/1995  . SHOULDER SURGERY Left 06/29/2013   exc. bone spur  . TRIGGER FINGER RELEASE Right 02/02/2016   Procedure: RIGHT INDEX RELEASE TRIGGER  ;  Surgeon: Leanora Cover, MD;  Location: [facility];  Service: Orthopedics;  Laterality: Right;    . Social History  Substance Use Topics  . Smoking status: Never Smoker  . Smokeless tobacco: Never Used  . Alcohol use 0.0 oz/week     Comment: occasionally    ALLERGIES:  is allergic to lipitor  [atorvastatin] and simvastatin.  MEDICATIONS:  Current Outpatient Prescriptions  Medication Sig Dispense Refill  . acetaminophen (TYLENOL) 650 MG CR tablet Take 650 mg by mouth as needed for pain.    Cynthia Moody aspirin 500 MG tablet Take 500 mg by mouth every 6 (six) hours as needed for pain.    Cynthia Moody b complex vitamins tablet Take 1 tablet by mouth daily.    . cholecalciferol (VITAMIN D) 1000 UNITS tablet Take 1,000 Units by mouth daily.    Cynthia Moody glucose blood (BAYER CONTOUR NEXT TEST) test strip Use to check blood sugar 4 times per day. 400 each 3  . HYDROcodone-acetaminophen (NORCO) 5-325 MG tablet 1-2 tabs po q6 hours prn pain 30 tablet 0  . ibuprofen (ADVIL) 200 MG tablet 1 tablet as needed    . Insulin Infusion Pump Supplies (PARADIGM QUICK-SET 32" 6MM) MISC 1 Device by Does not apply route every 3 (three) days.    . Insulin Infusion Pump Supplies (PARADIGM RESERVOIR 3ML) MISC 1 Device by Does not apply route every 3 (three) days.    . insulin lispro (HUMALOG) 100 UNIT/ML injection VIA INSULIN PUMP, total of 90 units/day 90 mL 3  . Polysaccharide Iron Complex 15 MG/0.5ML LIQD Take 150 mg by mouth 2 (two) times daily. 300 mL 3  . ranitidine (ZANTAC) 150 MG capsule Take 150 mg by mouth 2 (two) times daily.  No current facility-administered medications for this visit.    Facility-Administered Medications Ordered in Other Visits  Medication Dose Route Frequency Provider Last Rate Last Dose  . heparin lock flush 100 unit/mL  500 Units Intracatheter Once PRN Brunetta Genera, MD      . sodium chloride 0.9 % injection 10 mL  10 mL Intracatheter PRN Gloria Lambertson Juleen China, MD        PHYSICAL EXAMINATION: ECOG PERFORMANCE STATUS: 1 - Symptomatic but completely ambulatory  . Vitals:   04/04/17 1504  BP: 120/67  Pulse: 81  Resp: 18  Temp: 98.3 F (36.8 C)    Filed Weights   04/04/17 1504  Weight: 200 lb 3.2 oz (90.8 kg)   .Body mass index is 29.56 kg/m.  GENERAL:alert, in no acute distress  and comfortable SKIN: skin color, texture, turgor are normal, no rashes or significant lesions EYES: normal, conjunctiva are pink and non-injected, sclera clear OROPHARYNX:no exudate, no erythema and lips, buccal mucosa, and tongue normal  NECK: supple, no JVD, thyroid normal size, non-tender, without nodularity LYMPH:  no palpable lymphadenopathy in the cervical, axillary or inguinal LUNGS: clear to auscultation with normal respiratory effort HEART: regular rate & rhythm,  no murmurs and no lower extremity edema ABDOMEN: abdomen soft, non-tender, normoactive bowel sounds  Musculoskeletal: no cyanosis of digits and no clubbing  PSYCH: alert & oriented x 3 with fluent speech NEURO: no focal motor/sensory deficits  LABORATORY DATA:   I have reviewed the data as listed  . CBC Latest Ref Rng & Units 04/01/2017 08/24/2016 02/22/2016  WBC 3.9 - 10.3 10e3/uL 6.9 7.1 6.3  Hemoglobin 11.6 - 15.9 g/dL 13.6 12.6 12.8  Hematocrit 34.8 - 46.6 % 41.0 37.8 37.4  Platelets 145 - 400 10e3/uL 413(H) 376 357   . CBC    Component Value Date/Time   WBC 6.9 04/01/2017 0853   RBC 4.71 04/01/2017 0853   HGB 13.6 04/01/2017 0853   HCT 41.0 04/01/2017 0853   PLT 413 (H) 04/01/2017 0853   MCV 87.0 04/01/2017 0853   MCH 28.9 04/01/2017 0853   MCHC 33.2 04/01/2017 0853   RDW 12.5 04/01/2017 0853   LYMPHSABS 2.1 04/01/2017 0853   MONOABS 0.4 04/01/2017 0853   EOSABS 0.1 04/01/2017 0853   BASOSABS 0.0 04/01/2017 0853    . CMP Latest Ref Rng & Units 04/01/2017 08/24/2016 02/22/2016  Glucose 70 - 140 mg/dl 157(H) 227(H) 321(H)  BUN 7.0 - 26.0 mg/dL 11.3 14.6 10.4  Creatinine 0.6 - 1.1 mg/dL 1.0 1.1 1.1  Sodium 136 - 145 mEq/L 139 140 137  Potassium 3.5 - 5.1 mEq/L 3.9 3.9 4.3  Chloride 101 - 111 mmol/L - - -  CO2 22 - 29 mEq/L 23 23 28   Calcium 8.4 - 10.4 mg/dL 9.1 9.3 9.2  Total Protein 6.4 - 8.3 g/dL 7.0 7.0 7.3  Total Bilirubin 0.20 - 1.20 mg/dL 0.45 <0.30 0.35  Alkaline Phos 40 - 150 U/L 79 109 93    AST 5 - 34 U/L 14 12 18   ALT 0 - 55 U/L 11 <9 13   . Lab Results  Component Value Date   IRON 62 04/01/2017   TIBC 298 04/01/2017   IRONPCTSAT 21 04/01/2017   (Iron and TIBC)  Lab Results  Component Value Date   FERRITIN 17 04/01/2017    RADIOGRAPHIC STUDIES: I have personally reviewed the radiological images as listed and agreed with the findings in the report.  ASSESSMENT & PLAN:   #1 s/p Severe  iron deficiency with minimal anemia.  S/p IV feraheme on several occassions in the past Chronic PPI therapy is certainly an additional risk factor for developing iron deficiency. Menstrual losses on the heavier side but fairly regular. Tolerated IV iron well in the past.  Her CBC today shows hgb is improved at 13.6 with ferritin levels of 17 with 21 percent iron saturation Suggesting ongoing menstrual losses with decreased absorption. #2 intolerance to oral iron  Plan -no overt indication for IV iron replacement at this time. -She is willing to try oral iron preparation to see if she can tolerate it for iron maintenance purposes. -Given prescription for iron polysaccharide liquid 150 mg orally twice daily with orange Juice.  She was recommended to start once daily and go to twice daily if she tolerates it. -If tolerated would continue twice daily replacement to ferritin levels are about 50 and then switched to once daily for maintenance. -If ferritin levels go more than 100 might hold oral iron replacement and monitor.  Patient notes she will follow-up with her primary care physician regarding further evaluation and management of her iron deficiency. She will let us know if she needs any additional help.  Return to care with Dr. Irene Limbo on an as needed basis.  I spent 15 minutes counseling the patient face to face. The total time spent in the appointment was 20 minutes and more than 50% was on counseling and direct patient cares.    Sullivan Lone MD [place] AAHIVMS [facility]  [facility] Hematology/Oncology Physician [facility]  (Office):       [phone] (Work cell):  [phone] (Fax):           [phone]

## 2017-04-29 ENCOUNTER — Ambulatory Visit (INDEPENDENT_AMBULATORY_CARE_PROVIDER_SITE_OTHER): Payer: BLUE CROSS/BLUE SHIELD | Admitting: Adult Health

## 2017-04-29 ENCOUNTER — Encounter: Payer: Self-pay | Admitting: Adult Health

## 2017-04-29 VITALS — BP 136/68 | HR 87 | Ht 68.75 in | Wt 202.0 lb

## 2017-04-29 DIAGNOSIS — E103399 Type 1 diabetes mellitus with moderate nonproliferative diabetic retinopathy without macular edema, unspecified eye: Secondary | ICD-10-CM | POA: Diagnosis not present

## 2017-04-29 DIAGNOSIS — K219 Gastro-esophageal reflux disease without esophagitis: Secondary | ICD-10-CM

## 2017-04-29 DIAGNOSIS — E785 Hyperlipidemia, unspecified: Secondary | ICD-10-CM | POA: Diagnosis not present

## 2017-04-29 DIAGNOSIS — D509 Iron deficiency anemia, unspecified: Secondary | ICD-10-CM

## 2017-04-29 DIAGNOSIS — B379 Candidiasis, unspecified: Secondary | ICD-10-CM

## 2017-04-29 DIAGNOSIS — E10319 Type 1 diabetes mellitus with unspecified diabetic retinopathy without macular edema: Secondary | ICD-10-CM | POA: Diagnosis not present

## 2017-04-29 LAB — POCT UA - MICROALBUMIN
Albumin/Creatinine Ratio, Urine, POC: <30
CREATININE, POC: 100 mg/dL
MICROALBUMIN (UR) POC: 10 mg/L

## 2017-04-29 LAB — POCT GLYCOSYLATED HEMOGLOBIN (HGB A1C): Hemoglobin A1C: 8.2

## 2017-04-29 MED ORDER — FLUCONAZOLE 150 MG PO TABS: 150.0000 mg | ORAL_TABLET | Freq: Once | ORAL | 2 refills | Status: AC

## 2017-04-29 NOTE — Assessment & Plan Note (Signed)
Avoid spicy/acidic foods. Continue daily ranitidine 150mg .

## 2017-04-29 NOTE — Assessment & Plan Note (Signed)
Currently stable. Will repeat labs in 6 months.

## 2017-04-29 NOTE — Assessment & Plan Note (Signed)
Reduce saturated fat intake.

## 2017-04-29 NOTE — Assessment & Plan Note (Addendum)
Continue current therapy per your Endocrinologist. Increase regular movement to at least 13mins/5 times daily. Follow Diabetic Diet. Is scheduled for implantable pump change out this week. Followed by Autumn Jones/Endocrinology. A1c today 8.2 Microalbumin normal today. Follow-up every 6 months, sooner if needed.

## 2017-04-29 NOTE — Assessment & Plan Note (Signed)
One dose Diflucan sent in.

## 2017-04-29 NOTE — Progress Notes (Signed)
Subjective:    Patient ID: Cynthia Moody, female    DOB: [birth year], 46 y.o.   MRN: [id number]  HPI:  Cynthia Moody presents to establish as a new pt.  Cynthia Moody is a pleasant 46 year old female.  PMH:  T1D, HL, GERD, and chronic anemia.  Cynthia Moody has an implantable insulin pump that will be changed out b/c current device is recently out of warranty.  Cynthia Moody estimates using 60 units humalog insulin daily.  Recent addition of Jardiance 25mg  daily has helped normalize blodd sugar, today A1c is 8.2. Cynthia Moody walks 3 miles a week (1 mile a day at lunch when at work) and that is only exercise Cynthia Moody gets.  Cynthia Moody reports Cynthia Moody diet "is decent", however Cynthia Moody still consumes sugar/CHO.  Only acute issues:  1) R Knee pain (2/10) that spontaneously developed last week, denies acute injury/trauma.  2) Active yeast infection Cynthia Moody is married, no children and is employed at [employer].  Patient Care Team    Relationship Specialty Notifications Start End  Odella Aquas, NP PCP - General Family Medicine  04/29/17     Patient Active Problem List   Diagnosis Date Noted  . Diabetes mellitus with ophthalmic manifestations 05/07/2016  . Diabetes ([place]) 08/16/2015  . Dyslipidemia 08/16/2015  . GERD (gastroesophageal reflux disease) 08/16/2015  . Anemia, iron deficiency 08/16/2015     Past Medical History:  Diagnosis Date  . Acne   . Anemia   . Cough 01/27/2016  . Depression   . Diabetes mellitus type 1 ([place])   . Diabetic neuropathy (HCC)    feet  . Diabetic retinopathy ([place])    bilateral  . Dyslipidemia   . GERD (gastroesophageal reflux disease)   . Post-nasal drip 01/27/2016  . Shoulder pain, bilateral    wakes up frequently due to pain  . Stuffy nose 01/27/2016  . Trigger finger of right hand 01/2016   index finger     Past Surgical History:  Procedure Laterality Date  . CESAREAN SECTION  01/17/1995  . SHOULDER SURGERY Left 06/29/2013   exc. bone spur  . TRIGGER FINGER RELEASE Right 02/02/2016   Procedure: RIGHT INDEX  RELEASE TRIGGER  ;  Surgeon: Leanora Cover, MD;  Location: [facility];  Service: Orthopedics;  Laterality: Right;     Family History  Problem Relation Age of Onset  . Diabetes Mother   . Heart attack Mother     Age 81  . Hypertension Mother   . Heart attack Father     Age 49  . Hyperlipidemia Father   . Diabetes Maternal Uncle   . Diabetes Maternal Grandmother      History  Drug Use No     History  Alcohol Use  . 0.0 oz/week    Comment: occasionally     History  Smoking Status  . Never Smoker  Smokeless Tobacco  . Never Used     Outpatient Encounter Prescriptions as of 04/29/2017  Medication Sig Note  . acetaminophen (TYLENOL) 650 MG CR tablet Take 650 mg by mouth as needed for pain.   [name] Kitchen aspirin 500 MG tablet Take 500 mg by mouth every 6 (six) hours as needed for pain.   [name] Kitchen b complex vitamins tablet Take 1 tablet by mouth daily.   [name] Kitchen glucose blood (BAYER CONTOUR NEXT TEST) test strip Use to check blood sugar 4 times per day.   . ibuprofen (ADVIL) 200 MG tablet 1 tablet as needed 10/25/2015: Received from: [employer] manufacturing systems  . Insulin Infusion Pump Supplies (PARADIGM QUICK-SET 32" 6MM) MISC 1 Device by Does not apply route every 3 (three) days.   . Insulin Infusion Pump Supplies (PARADIGM RESERVOIR 3ML) MISC 1 Device by Does not apply route every 3 (three) days.   . insulin lispro (HUMALOG) 100 UNIT/ML injection VIA INSULIN PUMP, total of 90 units/day   . JARDIANCE 25 MG TABS tablet Take 1 tablet by mouth daily.   . Polysaccharide Iron Complex 15 MG/0.5ML LIQD Take 150 mg by mouth 2 (two) times daily.   . ranitidine (ZANTAC) 150 MG capsule Take 150 mg by mouth 2 (two) times daily.   . cholecalciferol (VITAMIN D) 1000 UNITS tablet Take 1,000 Units by mouth daily.   . fluconazole (DIFLUCAN) 150 MG tablet Take 1 tablet (150 mg total) by mouth once.   . [DISCONTINUED] HYDROcodone-acetaminophen (NORCO) 5-325 MG tablet 1-2 tabs po q6 hours prn pain     Facility-Administered Encounter Medications as of 04/29/2017  Medication  . heparin lock flush 100 unit/mL  . sodium chloride 0.9 % injection 10 mL    Allergies: Lipitor [atorvastatin] and Simvastatin  Body mass index is 30.05 kg/m.  Blood pressure 136/68, pulse 87, height 5' 8.75" (1.746 m), weight 202 lb (91.6 kg), last menstrual period 04/06/2017, SpO2 100 %.   Review of Systems  Constitutional: Negative for activity change, appetite change, chills, diaphoresis, fatigue, fever and unexpected weight change.  HENT: Negative for congestion.   Eyes: Negative for visual disturbance.  Respiratory: Negative for cough, chest tightness, shortness of breath, wheezing and stridor.   Cardiovascular: Negative for chest pain, palpitations and leg swelling.  Gastrointestinal: Negative for abdominal distention, abdominal pain, blood in stool, constipation, diarrhea, nausea and vomiting.  Endocrine: Negative for cold intolerance, heat intolerance, polydipsia, polyphagia and polyuria.  Genitourinary: Negative for difficulty urinating, flank pain and hematuria.  Musculoskeletal: Positive for arthralgias. Negative for back pain, gait problem, joint swelling, myalgias, neck pain and neck stiffness.  Skin: Negative for color change, pallor, rash and wound.  Neurological: Negative for dizziness, tremors, weakness, light-headedness and headaches.       Objective:   Physical Exam  Constitutional: Cynthia Moody is oriented to person, place, and time. Cynthia Moody appears well-developed and well-nourished. No distress.  HENT:  Head: Normocephalic and atraumatic.  Right Ear: External ear normal.  Left Ear: External ear normal.  Very narrow right canal with cerumen build up.  Eyes: Conjunctivae are normal. Pupils are equal, round, and reactive to light.  Neck: Normal range of motion. Neck supple.  Cardiovascular: Normal rate, regular rhythm, normal heart sounds and intact distal pulses.   No murmur  heard. Pulmonary/Chest: Effort normal and breath sounds normal. No respiratory distress. Cynthia Moody has no wheezes. Cynthia Moody has no rales. Cynthia Moody exhibits no tenderness.  Abdominal: Soft. Bowel sounds are normal. Cynthia Moody exhibits no distension and no mass. There is no tenderness. There is no rebound and no guarding.  Musculoskeletal: Normal range of motion. Cynthia Moody exhibits tenderness. Cynthia Moody exhibits no edema.       Right knee: Cynthia Moody exhibits normal range of motion and no swelling. Tenderness found.  Lymphadenopathy:    Cynthia Moody has no cervical adenopathy.  Neurological: Cynthia Moody is alert and oriented to person, place, and time. Coordination normal.  Skin: Skin is warm and dry. No rash noted. Cynthia Moody is not diaphoretic. No erythema. No pallor.  Psychiatric: Cynthia Moody has a normal mood and affect. Cynthia Moody behavior is normal. Judgment and thought content normal.  Nursing note and vitals reviewed.  Assessment & Plan:   1. Type 1 diabetes mellitus with retinopathy, macular edema presence unspecified, unspecified laterality, unspecified retinopathy severity ([place])   2. Gastroesophageal reflux disease without esophagitis   3. 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 Dyslipidemia   5. Iron deficiency anemia, unspecified iron deficiency anemia type   6. Yeast infection     GERD (gastroesophageal reflux disease) Avoid spicy/acidic foods. Continue daily ranitidine 150mg .  Diabetes Continue current therapy per your Endocrinologist. Increase regular movement to at least 19mins/5 times daily. Follow Diabetic Diet. Is scheduled for implantable pump change out this week. Followed by Autumn Jones/Endocrinology. A1c today 8.2 Microalbumin normal today. Follow-up every 6 months, sooner if needed.  Dyslipidemia Reduce saturated fat intake.  Anemia, iron deficiency Currently stable. Will repeat labs in 6 months.  Yeast infection One  dose Diflucan sent in.    FOLLOW-UP:  Return in about 6 months (around 10/30/2017) for Regular Follow Up, Diabetes.

## 2017-04-29 NOTE — Patient Instructions (Addendum)
Diabetes Mellitus and Food It is important for you to manage your blood sugar (glucose) level. Your blood glucose level can be greatly affected by what you eat. Eating healthier foods in the appropriate amounts throughout the day at about the same time each day will help you control your blood glucose level. It can also help slow or prevent worsening of your diabetes mellitus. Healthy eating may even help you improve the level of your blood pressure and reach or maintain a healthy weight. General recommendations for healthful eating and cooking habits include:  Eating meals and snacks regularly. Avoid going long periods of time without eating to lose weight.  Eating a diet that consists mainly of plant-based foods, such as fruits, vegetables, nuts, legumes, and whole grains.  Using low-heat cooking methods, such as baking, instead of high-heat cooking methods, such as deep frying.  Work with your dietitian to make sure you understand how to use the Nutrition Facts information on food labels. How can food affect me? Carbohydrates Carbohydrates affect your blood glucose level more than any other type of food. Your dietitian will help you determine how many carbohydrates to eat at each meal and teach you how to count carbohydrates. Counting carbohydrates is important to keep your blood glucose at a healthy level, especially if you are using insulin or taking certain medicines for diabetes mellitus. Alcohol Alcohol can cause sudden decreases in blood glucose (hypoglycemia), especially if you use insulin or take certain medicines for diabetes mellitus. Hypoglycemia can be a life-threatening condition. Symptoms of hypoglycemia (sleepiness, dizziness, and disorientation) are similar to symptoms of having too much alcohol. If your health care provider has given you approval to drink alcohol, do so in moderation and use the following guidelines:  Women should not have more than one drink per day, and men  should not have more than two drinks per day. One drink is equal to: ? 12 oz of beer. ? 5 oz of wine. ? 1 oz of hard liquor.  Do not drink on an empty stomach.  Keep yourself hydrated. Have water, diet soda, or unsweetened iced tea.  Regular soda, juice, and other mixers might contain a lot of carbohydrates and should be counted.  What foods are not recommended? As you make food choices, it is important to remember that all foods are not the same. Some foods have fewer nutrients per serving than other foods, even though they might have the same number of calories or carbohydrates. It is difficult to get your body what it needs when you eat foods with fewer nutrients. Examples of foods that you should avoid that are high in calories and carbohydrates but low in nutrients include:  Trans fats (most processed foods list trans fats on the Nutrition Facts label).  Regular soda.  Juice.  Candy.  Sweets, such as cake, pie, doughnuts, and cookies.  Fried foods.  What foods can I eat? Eat nutrient-rich foods, which will nourish your body and keep you healthy. The food you should eat also will depend on several factors, including:  The calories you need.  The medicines you take.  Your weight.  Your blood glucose level.  Your blood pressure level.  Your cholesterol level.  You should eat a variety of foods, including:  Protein. ? Lean cuts of meat. ? Proteins low in saturated fats, such as fish, egg whites, and beans. Avoid processed meats.  Fruits and vegetables. ? Fruits and vegetables that may help control blood glucose levels, such as apples,   yams.  Dairy products.  Choose fat-free or low-fat dairy products, such as milk, yogurt, and cheese.  Grains, bread, pasta, and rice.  Choose whole grain products, such as multigrain bread, whole oats, and brown rice. These foods may help control blood pressure.  Fats.  Foods containing healthful fats, such as nuts,  avocado, olive oil, canola oil, and fish. Does everyone with diabetes mellitus have the same meal plan? Because every person with diabetes mellitus is different, there is not one meal plan that works for everyone. It is very important that you meet with a dietitian who will help you create a meal plan that is just right for you. This information is not intended to replace advice given to you by your health care provider. Make sure you discuss any questions you have with your health care provider. Document Released: 09/06/2005 Document Revised: 05/17/2016 Document Reviewed: 11/06/2013 Elsevier Interactive Patient Education  2017 [employer].   Exercising to [employer] Exercising can help you to lose weight. In order to lose weight through exercise, you need to do vigorous-intensity exercise. You can tell that you are exercising with vigorous intensity if you are breathing very hard and fast and cannot hold a conversation while exercising. Moderate-intensity exercise helps to maintain your current weight. You can tell that you are exercising at a moderate level if you have a higher heart rate and faster breathing, but you are still able to hold a conversation. How often should I exercise? Choose an activity that you enjoy and set realistic goals. Your health care provider can help you to make an activity plan that works for you. Exercise regularly as directed by your health care provider. This may include:  Doing resistance training twice each week, such as:  Push-ups.  Sit-ups.  Lifting weights.  Using resistance bands.  Doing a given intensity of exercise for a given amount of time. Choose from these options:  150 minutes of moderate-intensity exercise every week.  75 minutes of vigorous-intensity exercise every week.  A mix of moderate-intensity and vigorous-intensity exercise every week. Children, pregnant women, people who are out of shape, people who are overweight, and older  adults may need to consult a health care provider for individual recommendations. If you have any sort of medical condition, be sure to consult your health care provider before starting a new exercise program. What are some activities that can help me to lose weight?  Walking at a rate of at least 4.5 miles an hour.  Jogging or running at a rate of 5 miles per hour.  Biking at a rate of at least 10 miles per hour.  Lap swimming.  Roller-skating or in-line skating.  Cross-country skiing.  Vigorous competitive sports, such as football, basketball, and soccer.  Jumping rope.  Aerobic dancing. How can I be more active in my day-to-day activities?  Use the stairs instead of the elevator.  Take a walk during your lunch break.  If you drive, park your car farther away from work or school.  If you take public transportation, get off one stop early and walk the rest of the way.  Make all of your phone calls while standing up and walking around.  Get up, stretch, and walk around every 30 minutes throughout the day. What guidelines should I follow while exercising?  Do not exercise so much that you hurt yourself, feel dizzy, or get very short of breath.  Consult your health care provider prior to starting a new exercise program.  Wear comfortable clothes and shoes with good support.  Drink plenty of water while you exercise to prevent dehydration or heat stroke. Body water is lost during exercise and must be replaced.  Work out until you breathe faster and your heart beats faster. This information is not intended to replace advice given to you by your health care provider. Make sure you discuss any questions you have with your health care provider. Document Released: 01/12/2011 Document Revised: 05/17/2016 Document Reviewed: 05/13/2014 Elsevier Interactive Patient Education  2017 [employer].  Please continue medications as directed. Increase regular movement to at least  63mins/5 times week. Follow a diabetic diet. Continue regular follow-up with Endocrinology. Follow-up here every 6 months, sooner if needed.

## 2017-05-03 ENCOUNTER — Encounter: Payer: Self-pay | Admitting: Adult Health

## 2017-05-05 ENCOUNTER — Other Ambulatory Visit: Payer: Self-pay | Admitting: Adult Health

## 2017-05-05 DIAGNOSIS — Z0000 Encounter for general adult medical examination without abnormal findings: Secondary | ICD-10-CM

## 2017-05-05 DIAGNOSIS — L602 Onychogryphosis: Secondary | ICD-10-CM

## 2017-05-06 ENCOUNTER — Telehealth: Payer: Self-pay | Admitting: Obstetrics and Gynecology

## 2017-05-06 NOTE — Telephone Encounter (Signed)
Called and left a message for patient to call back to schedule a new patient doctor referral for an AEX. °

## 2017-05-15 ENCOUNTER — Encounter: Payer: Self-pay | Admitting: Obstetrics and Gynecology

## 2017-05-15 ENCOUNTER — Other Ambulatory Visit (HOSPITAL_COMMUNITY)
Admission: RE | Admit: 2017-05-15 | Discharge: 2017-05-15 | Disposition: A | Discharge status: Home / Self Care | Payer: BLUE CROSS/BLUE SHIELD | Source: Ambulatory Visit | Attending: Obstetrics and Gynecology | Admitting: Obstetrics and Gynecology

## 2017-05-15 ENCOUNTER — Ambulatory Visit (INDEPENDENT_AMBULATORY_CARE_PROVIDER_SITE_OTHER): Payer: BLUE CROSS/BLUE SHIELD | Admitting: Obstetrics and Gynecology

## 2017-05-15 VITALS — BP 146/80 | HR 88 | Resp 18 | Ht 67.75 in | Wt 201.0 lb

## 2017-05-15 DIAGNOSIS — Z862 Personal history of diseases of the blood and blood-forming organs and certain disorders involving the immune mechanism: Secondary | ICD-10-CM

## 2017-05-15 DIAGNOSIS — Z01419 Encounter for gynecological examination (general) (routine) without abnormal findings: Secondary | ICD-10-CM

## 2017-05-15 DIAGNOSIS — Z124 Encounter for screening for malignant neoplasm of cervix: Secondary | ICD-10-CM

## 2017-05-15 DIAGNOSIS — N920 Excessive and frequent menstruation with regular cycle: Secondary | ICD-10-CM | POA: Diagnosis not present

## 2017-05-15 DIAGNOSIS — Z8639 Personal history of other endocrine, nutritional and metabolic disease: Secondary | ICD-10-CM

## 2017-05-15 MED ORDER — FLUCONAZOLE 150 MG PO TABS: ORAL_TABLET | ORAL | 1 refills | Status: DC

## 2017-05-15 MED ORDER — MOMETASONE FUROATE 0.1 % EX OINT: TOPICAL_OINTMENT | CUTANEOUS | 0 refills | Status: DC

## 2017-05-15 NOTE — Progress Notes (Signed)
46 y.o. G1P1001 MarriedCaucasianF here for annual exam.   Period Cycle (Days): 28 Period Duration (Days): 5-6 days  Period Pattern: Regular Menstrual Flow: Moderate, Heavy Menstrual Control: Maxi pad Menstrual Control Change Freq (Hours): changes pad every hour on heavy days  Dysmenorrhea: None  She is saturating an overnight pad in an hours. She has never had an ultrasound.  She has been married x 18 years, hasn't used contraception since then, never got pregnant. One son and he is 58.  The patient has type I DM, recent HgbA1C of 8.2. Not anemic last month, ferritin was 17.  She is on oral iron (2 slow fe a day), previously was getting iron transfusions.  She c/o continued issues with yeast infections. She is using something every month, typically monistat or diflucan. Any time her blood sugar goes up, it happens. She gets burning, itching and increased d/c. She c/o mild to moderate yeast infections. She has had many documented infections.   Patient's last menstrual period was 05/07/2017.          Sexually active: Yes.    The current method of family planning is none.    Exercising: Yes.    walking Smoker:  no  Health Maintenance: Pap:  10-18-15 WNL NEG HR HPV  History of abnormal Pap:  no MMG:  09/2016 WNL per patient  Colonoscopy:  2011 small polyps  BMD:   Never TDaP:  10-01-10  Gardasil: N/A   reports that she has never smoked. She has never used smokeless tobacco. She reports that she drinks alcohol. She reports that she does not use drugs. Less than 2 drinks a week. She works as a patient account rep.   Past Medical History:  Diagnosis Date  . Acne   . Anemia   . Cough 01/27/2016  . Depression   . Diabetes mellitus type 1 ([place])   . Diabetic neuropathy ([facility])    feet  . Diabetic retinopathy ([place])    bilateral  . Dyslipidemia   . GERD (gastroesophageal reflux disease)   . Infertility, female   . Post-nasal drip 01/27/2016  . Shoulder pain, bilateral    wakes up  frequently due to pain  . Stuffy nose 01/27/2016  . Trigger finger of right hand 01/2016   index finger    Past Surgical History:  Procedure Laterality Date  . CESAREAN SECTION  01/17/1995  . SHOULDER SURGERY Left 06/29/2013   exc. bone spur  . TRIGGER FINGER RELEASE Right 02/02/2016   Procedure: RIGHT INDEX RELEASE TRIGGER  ;  Surgeon: Leanora Cover, MD;  Location: [facility];  Service: Orthopedics;  Laterality: Right;    Current Outpatient Prescriptions  Medication Sig Dispense Refill  . acetaminophen (TYLENOL) 650 MG CR tablet Take 650 mg by mouth as needed for pain.    [name] Kitchen aspirin 500 MG tablet Take 500 mg by mouth every 6 (six) hours as needed for pain.    [name] Kitchen b complex vitamins tablet Take 1 tablet by mouth daily.    . caffeine 200 MG TABS tablet Take 200 mg by mouth every 4 (four) hours as needed.    . cholecalciferol (VITAMIN D) 1000 UNITS tablet Take 1,000 Units by mouth daily.    . Ferrous Sulfate Dried (SLOW RELEASE IRON) 45 MG TBCR Take by mouth.    . fexofenadine (ALLEGRA) 180 MG tablet Take 180 mg by mouth daily.    [name] Kitchen glucose blood (BAYER CONTOUR NEXT TEST) test strip Use to check blood sugar 4  times per day. 400 each 3  . ibuprofen (ADVIL) 200 MG tablet 1 tablet as needed    . Insulin Infusion Pump Supplies (PARADIGM QUICK-SET 32" 6MM) MISC 1 Device by Does not apply route every 3 (three) days.    . Insulin Infusion Pump Supplies (PARADIGM RESERVOIR 3ML) MISC 1 Device by Does not apply route every 3 (three) days.    . insulin lispro (HUMALOG) 100 UNIT/ML injection VIA INSULIN PUMP, total of 90 units/day 90 mL 3  . JARDIANCE 25 MG TABS tablet Take 1 tablet by mouth daily.    [name] Kitchen omeprazole (PRILOSEC) 20 MG capsule Take 20 mg by mouth daily.    . Probiotic Product (PROBIOTIC DAILY PO) Take by mouth.    . ranitidine (ZANTAC) 150 MG capsule Take 150 mg by mouth 2 (two) times daily.     No current facility-administered medications for this visit.    Facility-Administered  Medications Ordered in Other Visits  Medication Dose Route Frequency Provider Last Rate Last Dose  . heparin lock flush 100 unit/mL  500 Units Intracatheter Once PRN Brunetta Genera, MD      . sodium chloride 0.9 % injection 10 mL  10 mL Intracatheter PRN Brunetta Genera, MD        Family History  Problem Relation Age of Onset  . Diabetes Mother   . Heart attack Mother        Age 39  . Hypertension Mother   . Heart attack Father        Age 26  . Hyperlipidemia Father   . Diabetes Maternal Uncle   . Diabetes Maternal Grandmother     Review of Systems  Constitutional: Negative.   HENT: Negative.   Eyes: Negative.   Respiratory: Negative.   Cardiovascular: Negative.   Gastrointestinal: Negative.   Endocrine: Negative.   Genitourinary: Positive for menstrual problem.       Heavy menstrual bleeding   Musculoskeletal: Negative.   Skin: Negative.   Allergic/Immunologic: Negative.   Neurological: Negative.   Psychiatric/Behavioral: Negative.     Exam:   BP (!) 146/80 (BP Location: Right Arm, Patient Position: Sitting, Cuff Size: Normal)   Pulse 88   Resp 18   Ht 5' 7.75" (1.721 m)   Wt 201 lb (91.2 kg)   LMP 05/07/2017   BMI 30.79 kg/m   Weight change: @WEIGHTCHANGE @ Height:   Height: 5' 7.75" (172.1 cm)  Ht Readings from Last 3 Encounters:  05/15/17 5' 7.75" (1.721 m)  04/29/17 5' 8.75" (1.746 m)  04/04/17 5\' 9"  (1.753 m)    General appearance: alert, cooperative and appears stated age Head: Normocephalic, without obvious abnormality, atraumatic Neck: no adenopathy, supple, symmetrical, trachea midline and thyroid normal to inspection and palpation Lungs: clear to auscultation bilaterally Cardiovascular: regular rate and rhythm Breasts: normal appearance, no masses or tenderness Abdomen: soft, non-tender; bowel sounds normal; no masses,  no organomegaly Extremities: extremities normal, atraumatic, no cyanosis or edema Skin: Skin color, texture, turgor  normal. No rashes or lesions Lymph nodes: Cervical, supraclavicular, and axillary nodes normal. No abnormal inguinal nodes palpated Neurologic: Grossly normal   Pelvic: External genitalia:  no lesions, +erythema              Urethra:  normal appearing urethra with no masses, tenderness or lesions              Bartholins and Skenes: normal  Vagina: normal appearing vagina with normal color and discharge, no lesions              Cervix: no lesions               Bimanual Exam:  Uterus:  normal size, contour, position, consistency, mobility, non-tender and anteverted              Adnexa: no mass, fullness, tenderness               Rectovaginal: Confirms               Anus:  normal sphincter tone, no lesions  Chaperone was present for exam.  Wet prep: no clue, no trich, ++ wbc KOH: + yeast PH: 4   A:  Well Woman with normal exam  Menorrhagia  H/O iron def anemia, recent normal hgb with low normal ferritn  Recurrent yeast vaginitis  P:   Will have her return for an ultrasound, possible sonohysterogram, possible biopsy  Discussed mirena IUD insertion, information given  Mammogram in the fall  Will treat with diflucan 150 mg q 72 hours x 3 doses, then 1 x a week for 6 months  Steroid ointment for discomfort  Discussed breast self exam  Discussed calcium and vit D intake

## 2017-05-15 NOTE — Patient Instructions (Signed)
EXERCISE AND DIET:  We recommended that you start or continue a regular exercise program for good health. Regular exercise means any activity that makes your heart beat faster and makes you sweat.  We recommend exercising at least 30 minutes per day at least 3 days a week, preferably 4 or 5.  We also recommend a diet low in fat and sugar.  Inactivity, poor dietary choices and obesity can cause diabetes, heart attack, stroke, and kidney damage, among others.    ALCOHOL AND SMOKING:  Women should limit their alcohol intake to no more than 7 drinks/beers/glasses of wine (combined, not each!) per week. Moderation of alcohol intake to this level decreases your risk of breast cancer and liver damage. And of course, no recreational drugs are part of a healthy lifestyle.  And absolutely no smoking or even second hand smoke. Most people know smoking can cause heart and lung diseases, but did you know it also contributes to weakening of your bones? Aging of your skin?  Yellowing of your teeth and nails?  CALCIUM AND VITAMIN D:  Adequate intake of calcium and Vitamin D are recommended.  The recommendations for exact amounts of these supplements seem to change often, but generally speaking 600 mg of calcium (either carbonate or citrate) and 800 units of Vitamin D per day seems prudent. Certain women may benefit from higher intake of Vitamin D.  If you are among these women, your doctor will have told you during your visit.    PAP SMEARS:  Pap smears, to check for cervical cancer or precancers,  have traditionally been done yearly, although recent scientific advances have shown that most women can have pap smears less often.  However, every woman still should have a physical exam from her gynecologist every year. It will include a breast check, inspection of the vulva and vagina to check for abnormal growths or skin changes, a visual exam of the cervix, and then an exam to evaluate the size and shape of the uterus and  ovaries.  And after 44 years of age, a rectal exam is indicated to check for rectal cancers. We will also provide age appropriate advice regarding health maintenance, like when you should have certain vaccines, screening for sexually transmitted diseases, bone density testing, colonoscopy, mammograms, etc.   MAMMOGRAMS:  All women over 40 years old should have a yearly mammogram. Many facilities now offer a "3D" mammogram, which may cost around $50 extra out of pocket. If possible,  we recommend you accept the option to have the 3D mammogram performed.  It both reduces the number of women who will be called back for extra views which then turn out to be normal, and it is better than the routine mammogram at detecting truly abnormal areas.    COLONOSCOPY:  Colonoscopy to screen for colon cancer is recommended for all women at age 50.  We know, you hate the idea of the prep.  We agree, BUT, having colon cancer and not knowing it is worse!!  Colon cancer so often starts as a polyp that can be seen and removed at colonscopy, which can quite literally save your life!  And if your first colonoscopy is normal and you have no family history of colon cancer, most women don't have to have it again for 10 years.  Once every ten years, you can do something that may end up saving your life, right?  We will be happy to help you get it scheduled when you are ready.    Be sure to check your insurance coverage so you understand how much it will cost.  It may be covered as a preventative service at no cost, but you should check your particular policy.      Breast Self-Awareness Breast self-awareness means being familiar with how your breasts look and feel. It involves checking your breasts regularly and reporting any changes to your health care provider. Practicing breast self-awareness is important. A change in your breasts can be a sign of a serious medical problem. Being familiar with how your breasts look and feel allows  you to find any problems early, when treatment is more likely to be successful. All women should practice breast self-awareness, including women who have had breast implants. How to do a breast self-exam One way to learn what is normal for your breasts and whether your breasts are changing is to do a breast self-exam. To do a breast self-exam: Look for Changes   1. Remove all the clothing above your waist. 2. Stand in front of a mirror in a room with good lighting. 3. Put your hands on your hips. 4. Push your hands firmly downward. 5. Compare your breasts in the mirror. Look for differences between them (asymmetry), such as:  Differences in shape.  Differences in size.  Puckers, dips, and bumps in one breast and not the other. 6. Look at each breast for changes in your skin, such as:  Redness.  Scaly areas. 7. Look for changes in your nipples, such as:  Discharge.  Bleeding.  Dimpling.  Redness.  A change in position. Feel for Changes   Carefully feel your breasts for lumps and changes. It is best to do this while lying on your back on the floor and again while sitting or standing in the shower or tub with soapy water on your skin. Feel each breast in the following way:  Place the arm on the side of the breast you are examining above your head.  Feel your breast with the other hand.  Start in the nipple area and make  inch (2 cm) overlapping circles to feel your breast. Use the pads of your three middle fingers to do this. Apply light pressure, then medium pressure, then firm pressure. The light pressure will allow you to feel the tissue closest to the skin. The medium pressure will allow you to feel the tissue that is a little deeper. The firm pressure will allow you to feel the tissue close to the ribs.  Continue the overlapping circles, moving downward over the breast until you feel your ribs below your breast.  Move one finger-width toward the center of the body.  Continue to use the  inch (2 cm) overlapping circles to feel your breast as you move slowly up toward your collarbone.  Continue the up and down exam using all three pressures until you reach your armpit. Write Down What You Find   Write down what is normal for each breast and any changes that you find. Keep a written record with breast changes or normal findings for each breast. By writing this information down, you do not need to depend only on memory for size, tenderness, or location. Write down where you are in your menstrual cycle, if you are still menstruating. If you are having trouble noticing differences in your breasts, do not get discouraged. With time you will become more familiar with the variations in your breasts and more comfortable with the exam. How often should I examine my   breasts? Examine your breasts every month. If you are breastfeeding, the best time to examine your breasts is after a feeding or after using a breast pump. If you menstruate, the best time to examine your breasts is 5-7 days after your period is over. During your period, your breasts are lumpier, and it may be more difficult to notice changes. When should I see my health care provider? See your health care provider if you notice:  A change in shape or size of your breasts or nipples.  A change in the skin of your breast or nipples, such as a reddened or scaly area.  Unusual discharge from your nipples.  A lump or thick area that was not there before.  Pain in your breasts.  Anything that concerns you. This information is not intended to replace advice given to you by your health care provider. Make sure you discuss any questions you have with your health care provider. Document Released: 12/10/2005 Document Revised: 05/17/2016 Document Reviewed: 10/30/2015 Elsevier Interactive Patient Education  2017 [employer]  

## 2017-05-17 LAB — CYTOLOGY - PAP
DIAGNOSIS: NEGATIVE
HPV: NOT DETECTED

## 2017-05-21 ENCOUNTER — Telehealth: Payer: Self-pay | Admitting: Obstetrics and Gynecology

## 2017-05-21 NOTE — Telephone Encounter (Signed)
Spoke with patient regarding benefit for sonohysterogram with possible endometrial biopsy. Patient understood and agreeable. Patient ready to schedule. Patient scheduled 06/06/17 with Dr Talbert Nan. Patient requested this date, advised patient if she is on her cycle to call our office to review with a nurse. Patient is agreeable.Patient aware of date, arrival time and cancellation. Patient had no further questions.  Routing to Dr Talbert Nan

## 2017-06-03 ENCOUNTER — Ambulatory Visit (INDEPENDENT_AMBULATORY_CARE_PROVIDER_SITE_OTHER): Payer: BLUE CROSS/BLUE SHIELD | Admitting: Podiatry

## 2017-06-03 ENCOUNTER — Telehealth: Payer: Self-pay | Admitting: *Deleted

## 2017-06-03 ENCOUNTER — Encounter: Payer: Self-pay | Admitting: Podiatry

## 2017-06-03 DIAGNOSIS — R0989 Other specified symptoms and signs involving the circulatory and respiratory systems: Secondary | ICD-10-CM | POA: Diagnosis not present

## 2017-06-03 DIAGNOSIS — E1149 Type 2 diabetes mellitus with other diabetic neurological complication: Secondary | ICD-10-CM

## 2017-06-03 DIAGNOSIS — L600 Ingrowing nail: Secondary | ICD-10-CM | POA: Diagnosis not present

## 2017-06-03 NOTE — Telephone Encounter (Addendum)
-----   Message from Trula Slade, DPM sent at 06/03/2017  8:43 AM EDT ----- Can you please order arterial studies for decreased pulses, delayed wound healing, long standing type 1 diabetic. Orders faxed to [facility].

## 2017-06-03 NOTE — Progress Notes (Signed)
   Subjective:    Patient ID: Cynthia Moody, female    DOB: 03-07-1971, 46 y.o.   MRN: [id number]  HPI 46 year old female presents the also concerns of increasing tenderness in both of her big toes. She states this started about a month ago and she is having quite a bit of pain and redness around the nail corners. She states this has resolved and she is currently not having any redness or swelling or any pain but she still is having area checked. She is a type I diabetic and she is on insulin pump. Last A1c was 7.9 she states. She states that in general she has difficulty healing wounds were feeling take a while to heal she denies any diabetic type ulceration. She denies any claudication symptoms. She does have neuropathy. No other complaints.   Review of Systems  All other systems reviewed and are negative.      Objective:   Physical Exam General: AAO x3, NAD  Dermatological: There is incurvation along the medial and lateral aspects of bilateral hallux toenails is no edema, erythema, drainage or pus or any clinical signs of infection. The remaining nails are also somewhat incurvated to the lesser digits. There is no pain with the toenails today. There is no clinical signs of infection. No open lesions identified. There is dry skin present.  Vascular: DP pulses 2/4 bilaterally, PT pulses 1/4. CRT less than 3 seconds. There is no pain with calf compression, swelling, warmth, erythema.   Neruologic: Sensation decreased with Derrel Nip monofilament.  Musculoskeletal: Hammertoes are present. No pain, crepitus, or limitation noted with foot and ankle range of motion bilateral. Muscular strength 5/5 in all groups tested bilateral.  Gait: Unassisted, Nonantalgic.      Assessment & Plan:  46 year old female bilateral hallux ingrown toenails -Treatment options discussed including all alternatives, risks, and complications -Etiology of symptoms were discussed -Given her history of slow  healing wounds as well as mildly decreased posterior tibial pulses will order arterial studies. -Discussed partial nail avulsion however currently the areas a symptomatic. Also given her history of slow healing wounds to hold off on this and time obtaining arterial studies. This is ordered today. I sharply debrided symptomatic portion ingrown toenails with any complications or bleeding. -Daily foot inspection. -RTC after arterial studies or sooner if needed.  Celesta Gentile, DPM

## 2017-06-03 NOTE — Patient Instructions (Signed)
Look at a moisturizer that includes urea    Diabetes and Foot Care Diabetes may cause you to have problems because of poor blood supply (circulation) to your feet and legs. This may cause the skin on your feet to become thinner, break easier, and heal more slowly. Your skin may become dry, and the skin may peel and crack. You may also have nerve damage in your legs and feet causing decreased feeling in them. You may not notice minor injuries to your feet that could lead to infections or more serious problems. Taking care of your feet is one of the most important things you can do for yourself. Follow these instructions at home:  Wear shoes at all times, even in the house. Do not go barefoot. Bare feet are easily injured.  Check your feet daily for blisters, cuts, and redness. If you cannot see the bottom of your feet, use a mirror or ask someone for help.  Wash your feet with warm water (do not use hot water) and mild soap. Then pat your feet and the areas between your toes until they are completely dry. Do not soak your feet as this can dry your skin.  Apply a moisturizing lotion or petroleum jelly (that does not contain alcohol and is unscented) to the skin on your feet and to dry, brittle toenails. Do not apply lotion between your toes.  Trim your toenails straight across. Do not dig under them or around the cuticle. File the edges of your nails with an emery board or nail file.  Do not cut corns or calluses or try to remove them with medicine.  Wear clean socks or stockings every day. Make sure they are not too tight. Do not wear knee-high stockings since they may decrease blood flow to your legs.  Wear shoes that fit properly and have enough cushioning. To break in new shoes, wear them for just a few hours a day. This prevents you from injuring your feet. Always look in your shoes before you put them on to be sure there are no objects inside.  Do not cross your legs. This may decrease the  blood flow to your feet.  If you find a minor scrape, cut, or break in the skin on your feet, keep it and the skin around it clean and dry. These areas may be cleansed with mild soap and water. Do not cleanse the area with peroxide, alcohol, or iodine.  When you remove an adhesive bandage, be sure not to damage the skin around it.  If you have a wound, look at it several times a day to make sure it is healing.  Do not use heating pads or hot water bottles. They may burn your skin. If you have lost feeling in your feet or legs, you may not know it is happening until it is too late.  Make sure your health care provider performs a complete foot exam at least annually or more often if you have foot problems. Report any cuts, sores, or bruises to your health care provider immediately. Contact a health care provider if:  You have an injury that is not healing.  You have cuts or breaks in the skin.  You have an ingrown nail.  You notice redness on your legs or feet.  You feel burning or tingling in your legs or feet.  You have pain or cramps in your legs and feet.  Your legs or feet are numb.  Your feet always feel  cold. Get help right away if:  There is increasing redness, swelling, or pain in or around a wound.  There is a red line that goes up your leg.  Pus is coming from a wound.  You develop a fever or as directed by your health care provider.  You notice a bad smell coming from an ulcer or wound. This information is not intended to replace advice given to you by your health care provider. Make sure you discuss any questions you have with your health care provider. Document Released: 12/07/2000 Document Revised: 05/17/2016 Document Reviewed: 05/19/2013 Elsevier Interactive Patient Education  2017 [employer].

## 2017-06-06 ENCOUNTER — Encounter: Payer: Self-pay | Admitting: Obstetrics and Gynecology

## 2017-06-06 ENCOUNTER — Ambulatory Visit (INDEPENDENT_AMBULATORY_CARE_PROVIDER_SITE_OTHER): Payer: BLUE CROSS/BLUE SHIELD | Admitting: Obstetrics and Gynecology

## 2017-06-06 ENCOUNTER — Other Ambulatory Visit: Payer: Self-pay | Admitting: Obstetrics and Gynecology

## 2017-06-06 ENCOUNTER — Ambulatory Visit (INDEPENDENT_AMBULATORY_CARE_PROVIDER_SITE_OTHER): Payer: BLUE CROSS/BLUE SHIELD

## 2017-06-06 VITALS — BP 120/80 | HR 68 | Resp 12 | Ht 67.75 in | Wt 201.0 lb

## 2017-06-06 DIAGNOSIS — N920 Excessive and frequent menstruation with regular cycle: Secondary | ICD-10-CM | POA: Diagnosis not present

## 2017-06-06 DIAGNOSIS — Z862 Personal history of diseases of the blood and blood-forming organs and certain disorders involving the immune mechanism: Secondary | ICD-10-CM

## 2017-06-06 DIAGNOSIS — Z3009 Encounter for other general counseling and advice on contraception: Secondary | ICD-10-CM

## 2017-06-06 NOTE — Progress Notes (Signed)
GYNECOLOGY  VISIT   HPI: 46 y.o.   Married  Caucasian  female   G1P1001 with Patient's last menstrual period was 05/25/2017.   here for sonohystogram.  H/O menorrhagia. She is on oral iron, not anemic in 4/18, ferritin was 17.  She has a long h/o infertility.   GYNECOLOGIC HISTORY: Patient's last menstrual period was 05/25/2017. Contraception: none Menopausal hormone therapy: none        OB History    Gravida Para Term Preterm AB Living   1 1 1     1    SAB TAB Ectopic Multiple Live Births           1         Patient Active Problem List   Diagnosis Date Noted  . Ingrown toenail 06/03/2017  . Yeast infection 04/29/2017  . Diabetes mellitus with ophthalmic complication ([place]) 27/51/7001  . Diabetes ([place]) 08/16/2015  . Dyslipidemia 08/16/2015  . GERD (gastroesophageal reflux disease) 08/16/2015  . Anemia, iron deficiency 08/16/2015    Past Medical History:  Diagnosis Date  . Acne   . Anemia   . Cough 01/27/2016  . Depression   . Diabetes mellitus type 1 ([place])   . Diabetic neuropathy ([facility])    feet  . Diabetic retinopathy ([place])    bilateral  . Dyslipidemia   . GERD (gastroesophageal reflux disease)   . Infertility, female   . Post-nasal drip 01/27/2016  . Shoulder pain, bilateral    wakes up frequently due to pain  . Stuffy nose 01/27/2016  . Trigger finger of right hand 01/2016   index finger    Past Surgical History:  Procedure Laterality Date  . CESAREAN SECTION  01/17/1995  . SHOULDER SURGERY Left 06/29/2013   exc. bone spur  . TRIGGER FINGER RELEASE Right 02/02/2016   Procedure: RIGHT INDEX RELEASE TRIGGER  ;  Surgeon: Leanora Cover, MD;  Location: [facility];  Service: Orthopedics;  Laterality: Right;    Current Outpatient Prescriptions  Medication Sig Dispense Refill  . acetaminophen (TYLENOL) 650 MG CR tablet Take 650 mg by mouth as needed for pain.    [name] Kitchen aspirin 500 MG tablet Take 500 mg by mouth every 6 (six) hours as needed for pain.    [name] Kitchen b  complex vitamins tablet Take 1 tablet by mouth daily.    . caffeine 200 MG TABS tablet Take 200 mg by mouth every 4 (four) hours as needed.    . cholecalciferol (VITAMIN D) 1000 UNITS tablet Take 1,000 Units by mouth daily.    . Ferrous Sulfate Dried (SLOW RELEASE IRON) 45 MG TBCR Take by mouth.    . fexofenadine (ALLEGRA) 180 MG tablet Take 180 mg by mouth daily.    . fluconazole (DIFLUCAN) 150 MG tablet One tablet po q 72 hours x 3 doses, then one tablet a week x 6 months. 14 tablet 1  . glucose blood (BAYER CONTOUR NEXT TEST) test strip Use to check blood sugar 4 times per day. 400 each 3  . ibuprofen (ADVIL) 200 MG tablet 1 tablet as needed    . Insulin Infusion Pump Supplies (PARADIGM QUICK-SET 32" 6MM) MISC 1 Device by Does not apply route every 3 (three) days.    . Insulin Infusion Pump Supplies (PARADIGM RESERVOIR 3ML) MISC 1 Device by Does not apply route every 3 (three) days.    . insulin lispro (HUMALOG) 100 UNIT/ML injection VIA INSULIN PUMP, total of 90 units/day 90 mL 3  . JARDIANCE 25  MG TABS tablet Take 1 tablet by mouth daily.    . mometasone (ELOCON) 0.1 % ointment Apply a pea sized amount topically qd x 1-2 weeks. Not for daily use. 15 g 0  . omeprazole (PRILOSEC) 20 MG capsule Take 20 mg by mouth daily.    . Probiotic Product (PROBIOTIC DAILY PO) Take by mouth.    . ranitidine (ZANTAC) 150 MG capsule Take 150 mg by mouth 2 (two) times daily.     No current facility-administered medications for this visit.    Facility-Administered Medications Ordered in Other Visits  Medication Dose Route Frequency Provider Last Rate Last Dose  . heparin lock flush 100 unit/mL  500 Units Intracatheter Once PRN Brunetta Genera, MD      . sodium chloride 0.9 % injection 10 mL  10 mL Intracatheter PRN Brunetta Genera, MD         ALLERGIES: Lipitor [atorvastatin] and Simvastatin  Family History  Problem Relation Age of Onset  . Diabetes Mother   . Heart attack Mother        Age  46  . Hypertension Mother   . Heart attack Father        Age 55  . Hyperlipidemia Father   . Diabetes Maternal Uncle   . Diabetes Maternal Grandmother     Social History   Social History  . Marital status: Married    Spouse name: N/A  . Number of children: N/A  . Years of education: N/A   Occupational History  . Not on file.   Social History Main Topics  . Smoking status: Never Smoker  . Smokeless tobacco: Never Used  . Alcohol use Yes     Comment: occasionally  . Drug use: No  . Sexual activity: Yes    Partners: Male    Birth control/ protection: None   Other Topics Concern  . Not on file   Social History Narrative  . No narrative on file    Review of Systems  Constitutional: Negative.   HENT: Negative.   Eyes: Negative.   Respiratory: Negative.   Cardiovascular: Negative.   Gastrointestinal: Negative.   Genitourinary: Negative.   Musculoskeletal: Negative.   Skin: Negative.   Neurological: Negative.   Endo/Heme/Allergies: Negative.   Psychiatric/Behavioral: Negative.     PHYSICAL EXAMINATION:    BP 120/80 (BP Location: Right Arm, Patient Position: Sitting, Cuff Size: Normal)   Pulse 68   Resp 12   Ht 5' 7.75" (1.721 m)   Wt 201 lb (91.2 kg)   LMP 05/25/2017   BMI 30.79 kg/m     General appearance: alert, cooperative and appears stated age  Pelvic: External genitalia:  no lesions              Urethra:  normal appearing urethra with no masses, tenderness or lesions              Bartholins and Skenes: normal                 Vagina: normal appearing vagina with normal color and discharge, no lesions              Cervix: no lesions               Sonohysterogram The procedure and risks of the procedure were reviewed with the patient, consent form was signed. A speculum was placed in the vagina and the cervix was cleansed with hibaclens. The sonohysterogram catheter was inserted into the uterine cavity  without difficulty. Saline was infused under  direct observation with the ultrasound. No intracavitary defects were noted, no cervical lesions were noted.The catheter was removed.   The risks of endometrial biopsy were reviewed and a consent was obtained.  A speculum was placed in the vagina and the cervix was cleansed with hibaclens. A mini-pipelle was placed into the endometrial cavity. The uterus sounded to 7-8 cm. The endometrial biopsy was performed, moderate tissue was obtained. The tenaculum and speculum were removed. There were no complications.   Chaperone was present for exam.  Ultrasound images were reviewed with the patient  ASSESSMENT Menorrhagia, normal sonohysterogram H/O anemia, on daily iron    PLAN Endometrial biopsy done She will return for mirena IUD insertion on her cycle Mirena for menorrhagia, but also recommend contraception. She doesn't want to get pregnant at this point   An After Visit Summary was printed and given to the patient.  15 minutes face to face time of which over 50% was spent in counseling.

## 2017-06-06 NOTE — Addendum Note (Signed)
Addended by: Dorothy Spark on: 06/06/2017 03:23 PM   Modules accepted: Orders

## 2017-06-06 NOTE — Patient Instructions (Signed)

## 2017-06-07 ENCOUNTER — Encounter: Payer: Self-pay | Admitting: Adult Health

## 2017-06-10 ENCOUNTER — Other Ambulatory Visit: Payer: Self-pay

## 2017-06-10 ENCOUNTER — Other Ambulatory Visit: Payer: Self-pay | Admitting: Adult Health

## 2017-06-10 MED ORDER — OMEPRAZOLE 20 MG PO CPDR: 20.0000 mg | DELAYED_RELEASE_CAPSULE | Freq: Every day | ORAL | 2 refills | Status: DC

## 2017-06-19 ENCOUNTER — Telehealth: Payer: Self-pay | Admitting: Obstetrics and Gynecology

## 2017-06-19 NOTE — Telephone Encounter (Signed)
Spoke with patient regarding benefit for Mirena IUD insertion. Patient understood and agreeable. Patient ready to schedule. Patient advises she is due to start her cycle any day. Patient scheduled 06/24/17 with Dr Talbert Nan. Advised patient if she has not started her cycle by Friday 06/21/17, to call our office and discuss with a nurse.Patient aware of date, arrival time and cancellation policy. No further questions.   Routing to Dr Talbert Nan for final review

## 2017-06-23 HISTORY — PX: INTRAUTERINE DEVICE (IUD) INSERTION: SHX5877

## 2017-06-24 ENCOUNTER — Ambulatory Visit (INDEPENDENT_AMBULATORY_CARE_PROVIDER_SITE_OTHER): Payer: BLUE CROSS/BLUE SHIELD | Admitting: Obstetrics and Gynecology

## 2017-06-24 ENCOUNTER — Encounter: Payer: Self-pay | Admitting: Obstetrics and Gynecology

## 2017-06-24 VITALS — BP 104/60 | HR 84 | Resp 16 | Wt 204.0 lb

## 2017-06-24 DIAGNOSIS — Z30430 Encounter for insertion of intrauterine contraceptive device: Secondary | ICD-10-CM

## 2017-06-24 DIAGNOSIS — Z01812 Encounter for preprocedural laboratory examination: Secondary | ICD-10-CM

## 2017-06-24 DIAGNOSIS — Z3009 Encounter for other general counseling and advice on contraception: Secondary | ICD-10-CM

## 2017-06-24 DIAGNOSIS — N920 Excessive and frequent menstruation with regular cycle: Secondary | ICD-10-CM

## 2017-06-24 LAB — POCT URINE PREGNANCY: PREG TEST UR: NEGATIVE

## 2017-06-24 NOTE — Patient Instructions (Signed)

## 2017-06-24 NOTE — Progress Notes (Signed)
GYNECOLOGY  VISIT   HPI: 46 y.o.   Married  Caucasian  female   G1P1001 with Patient's last menstrual period was 06/21/2017.   here for Mirena IUD insertion. Menorrhagia, normal sonohysterogram, benign biopsy. Also needs contraception.   GYNECOLOGIC HISTORY: Patient's last menstrual period was 06/21/2017. Contraception:none  Menopausal hormone therapy: none         OB History    Gravida Para Term Preterm AB Living   1 1 1     1    SAB TAB Ectopic Multiple Live Births           1         Patient Active Problem List   Diagnosis Date Noted  . Ingrown toenail 06/03/2017  . Yeast infection 04/29/2017  . Diabetes mellitus with ophthalmic complication ([place] [facility]) 78/46/9629  . Diabetes ([place]) 08/16/2015  . Dyslipidemia 08/16/2015  . GERD (gastroesophageal reflux disease) 08/16/2015  . Anemia, iron deficiency 08/16/2015    Past Medical History:  Diagnosis Date  . Acne   . Anemia   . Cough 01/27/2016  . Depression   . Diabetes mellitus type 1 ([place])   . Diabetic neuropathy ([facility])    feet  . Diabetic retinopathy ([place])    bilateral  . Dyslipidemia   . GERD (gastroesophageal reflux disease)   . Infertility, female   . Post-nasal drip 01/27/2016  . Shoulder pain, bilateral    wakes up frequently due to pain  . Stuffy nose 01/27/2016  . Trigger finger of right hand 01/2016   index finger    Past Surgical History:  Procedure Laterality Date  . CESAREAN SECTION  01/17/1995  . SHOULDER SURGERY Left 06/29/2013   exc. bone spur  . TRIGGER FINGER RELEASE Right 02/02/2016   Procedure: RIGHT INDEX RELEASE TRIGGER  ;  Surgeon: Leanora Cover, MD;  Location: [facility];  Service: Orthopedics;  Laterality: Right;    Current Outpatient Prescriptions  Medication Sig Dispense Refill  . acetaminophen (TYLENOL) 650 MG CR tablet Take 650 mg by mouth as needed for pain.    [name] Kitchen aspirin 500 MG tablet Take 500 mg by mouth every 6 (six) hours as needed for pain.    [name] Kitchen b complex vitamins tablet Take  1 tablet by mouth daily.    . caffeine 200 MG TABS tablet Take 200 mg by mouth every 4 (four) hours as needed.    . cholecalciferol (VITAMIN D) 1000 UNITS tablet Take 1,000 Units by mouth daily.    . Ferrous Sulfate Dried (SLOW RELEASE IRON) 45 MG TBCR Take by mouth.    . fexofenadine (ALLEGRA) 180 MG tablet Take 180 mg by mouth daily.    . fluconazole (DIFLUCAN) 150 MG tablet One tablet po q 72 hours x 3 doses, then one tablet a week x 6 months. 14 tablet 1  . glucose blood (BAYER CONTOUR NEXT TEST) test strip Use to check blood sugar 4 times per day. 400 each 3  . ibuprofen (ADVIL) 200 MG tablet 1 tablet as needed    . Insulin Infusion Pump Supplies (PARADIGM QUICK-SET 32" 6MM) MISC 1 Device by Does not apply route every 3 (three) days.    . Insulin Infusion Pump Supplies (PARADIGM RESERVOIR 3ML) MISC 1 Device by Does not apply route every 3 (three) days.    . insulin lispro (HUMALOG) 100 UNIT/ML injection VIA INSULIN PUMP, total of 90 units/day 90 mL 3  . JARDIANCE 25 MG TABS tablet Take 1 tablet by mouth daily.    [name] Kitchen  mometasone (ELOCON) 0.1 % ointment Apply a pea sized amount topically qd x 1-2 weeks. Not for daily use. 15 g 0  . omeprazole (PRILOSEC) 20 MG capsule Take 1 capsule (20 mg total) by mouth daily. 90 capsule 2  . Probiotic Product (PROBIOTIC DAILY PO) Take by mouth.    . ranitidine (ZANTAC) 150 MG capsule Take 150 mg by mouth 2 (two) times daily.     No current facility-administered medications for this visit.    Facility-Administered Medications Ordered in Other Visits  Medication Dose Route Frequency Provider Last Rate Last Dose  . heparin lock flush 100 unit/mL  500 Units Intracatheter Once PRN Brunetta Genera, MD      . sodium chloride 0.9 % injection 10 mL  10 mL Intracatheter PRN Brunetta Genera, MD         ALLERGIES: Lipitor [atorvastatin] and Simvastatin  Family History  Problem Relation Age of Onset  . Diabetes Mother   . Heart attack Mother        Age  33  . Hypertension Mother   . Heart attack Father        Age 94  . Hyperlipidemia Father   . Diabetes Maternal Uncle   . Diabetes Maternal Grandmother     Social History   Social History  . Marital status: Married    Spouse name: N/A  . Number of children: N/A  . Years of education: N/A   Occupational History  . Not on file.   Social History Main Topics  . Smoking status: Never Smoker  . Smokeless tobacco: Never Used  . Alcohol use Yes     Comment: occasionally  . Drug use: No  . Sexual activity: Yes    Partners: Male    Birth control/ protection: None   Other Topics Concern  . Not on file   Social History Narrative  . No narrative on file    Review of Systems  Constitutional: Negative.   HENT: Negative.   Eyes: Negative.   Respiratory: Negative.   Cardiovascular: Negative.   Gastrointestinal: Negative.   Genitourinary: Negative.   Musculoskeletal: Negative.   Skin: Negative.   Neurological: Negative.   Endo/Heme/Allergies: Negative.   Psychiatric/Behavioral: Negative.     PHYSICAL EXAMINATION:    BP 104/60 (BP Location: Right Arm, Patient Position: Sitting, Cuff Size: Normal)   Pulse 84   Resp 16   Wt 204 lb (92.5 kg)   LMP 06/21/2017   BMI 31.25 kg/m     General appearance: alert, cooperative and appears stated age  Pelvic: External genitalia:  no lesions              Urethra:  normal appearing urethra with no masses, tenderness or lesions              Bartholins and Skenes: normal                 Vagina: normal appearing vagina with normal color and discharge, no lesions              Cervix: no lesions  The risks of the mirena IUD were reviewed with the patient, including infection, abnormal bleeding and uterine perfortion. Consent was signed.  A speculum was placed in the vagina, the cervix was cleansed with betadine. A tenaculum was placed on the cervix, the uterus sounded to 9 cm. No dilation was needed  The mirena IUD was inserted  without difficulty. The string were cut to 3-4 cm. The  tenaculum was removed. Slight oozing from the tenaculum site was stopped with pressure.   The patient tolerated the procedure well.    Chaperone was present for exam.  ASSESSMENT Mirena IUD insertion    PLAN F/U in 1 month   An After Visit Summary was printed and given to the patient.

## 2017-07-04 NOTE — Telephone Encounter (Signed)
Appointment completed on 06/24/17 for IUD insertion

## 2017-07-10 ENCOUNTER — Encounter: Payer: Self-pay | Admitting: Adult Health

## 2017-07-25 ENCOUNTER — Encounter: Payer: Self-pay | Admitting: Adult Health

## 2017-07-25 ENCOUNTER — Ambulatory Visit (INDEPENDENT_AMBULATORY_CARE_PROVIDER_SITE_OTHER): Payer: BLUE CROSS/BLUE SHIELD | Admitting: Obstetrics and Gynecology

## 2017-07-25 ENCOUNTER — Encounter: Payer: Self-pay | Admitting: Obstetrics and Gynecology

## 2017-07-25 ENCOUNTER — Ambulatory Visit (INDEPENDENT_AMBULATORY_CARE_PROVIDER_SITE_OTHER): Payer: BLUE CROSS/BLUE SHIELD | Admitting: Adult Health

## 2017-07-25 ENCOUNTER — Other Ambulatory Visit: Payer: Self-pay

## 2017-07-25 VITALS — BP 102/60 | HR 76 | Resp 16 | Wt 207.0 lb

## 2017-07-25 DIAGNOSIS — J209 Acute bronchitis, unspecified: Secondary | ICD-10-CM | POA: Diagnosis not present

## 2017-07-25 DIAGNOSIS — Z30431 Encounter for routine checking of intrauterine contraceptive device: Secondary | ICD-10-CM | POA: Diagnosis not present

## 2017-07-25 DIAGNOSIS — N393 Stress incontinence (female) (male): Secondary | ICD-10-CM | POA: Diagnosis not present

## 2017-07-25 DIAGNOSIS — S81801A Unspecified open wound, right lower leg, initial encounter: Secondary | ICD-10-CM | POA: Insufficient documentation

## 2017-07-25 DIAGNOSIS — J0101 Acute recurrent maxillary sinusitis: Secondary | ICD-10-CM

## 2017-07-25 DIAGNOSIS — J0100 Acute maxillary sinusitis, unspecified: Secondary | ICD-10-CM | POA: Insufficient documentation

## 2017-07-25 MED ORDER — AZITHROMYCIN 250 MG PO TABS: ORAL_TABLET | ORAL | 0 refills | Status: DC

## 2017-07-25 MED ORDER — HYDROCOD POLST-CPM POLST ER 10-8 MG/5ML PO SUER: 5.0000 mL | Freq: Two times a day (BID) | ORAL | 0 refills | Status: DC | PRN

## 2017-07-25 MED ORDER — AMOXICILLIN-POT CLAVULANATE 875-125 MG PO TABS: 1.0000 | ORAL_TABLET | Freq: Two times a day (BID) | ORAL | 0 refills | Status: DC

## 2017-07-25 MED ORDER — HYDROCOD POLST-CPM POLST ER 10-8 MG PO CP12: 1.0000 | ORAL_CAPSULE | Freq: Two times a day (BID) | ORAL | 0 refills | Status: DC | PRN

## 2017-07-25 NOTE — Assessment & Plan Note (Signed)
Augmentin, Flonase, Allegra. Increase fluids/rest.

## 2017-07-25 NOTE — Assessment & Plan Note (Signed)
Keep clean, dry, covered. Augmentin course provided. Already on Diflucan for yeast inf prophylaxis.

## 2017-07-25 NOTE — Progress Notes (Signed)
Subjective:    Patient ID: Cynthia Moody, female    DOB: 14-May-1971, 46 y.o.   MRN: [id number]  HPI:  Cynthia Moody presents with constant, non-productive cough, clear nasal drainage, sore throat (2/10), and fatigue r/t perpetual nocturnal cough.  Sx's present >2 weeks and she has been using Allegra and Floanse with only minimal sx relief.  She reports "I just can't kick this".  She estimates to drink >60 ounces water a day and drinks a glass of orange juice each morning.  She recently received Libra blood glucose monitoring system 05/2017 and IUD 06/24/17. She also has non-healing open wound on R distal shin from dog scratch that occurred >3 weeks ago. She denies CP/palpitations/night sweats/fever.  Patient Care Team    Relationship Specialty Notifications Start End  Esaw Grandchild, NP PCP - General Family Medicine  04/29/17     Patient Active Problem List   Diagnosis Date Noted  . Ingrown toenail 06/03/2017  . Yeast infection 04/29/2017  . 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 Diabetes ([place]) 08/16/2015  . Dyslipidemia 08/16/2015  . GERD (gastroesophageal reflux disease) 08/16/2015  . Anemia, iron deficiency 08/16/2015     Past Medical History:  Diagnosis Date  . Acne   . Anemia   . Cough 01/27/2016  . Depression   . Diabetes mellitus type 1 ([place])   . Diabetic neuropathy (H[facility])    feet  . Diabetic retinopathy ([place])    bilateral  . Dyslipidemia   . GERD (gastroesophageal reflux disease)   . Infertility, female   . Post-nasal drip 01/27/2016  . Shoulder pain, bilateral    wakes up frequently due to pain  . Stuffy nose 01/27/2016  . Trigger finger of right hand 01/2016   index finger     Past Surgical History:  Procedure Laterality Date  . CESAREAN SECTION  01/17/1995  . INTRAUTERINE DEVICE (IUD) INSERTION  06/2017   Mirena   . SHOULDER SURGERY Left 06/29/2013   exc. bone spur  . TRIGGER FINGER RELEASE Right 02/02/2016   Procedure: RIGHT INDEX RELEASE  TRIGGER  ;  Surgeon: Leanora Cover, MD;  Location: [facility];  Service: Orthopedics;  Laterality: Right;     Family History  Problem Relation Age of Onset  . Diabetes Mother   . Heart attack Mother        Age 81  . Hypertension Mother   . Heart attack Father        Age 55  . Hyperlipidemia Father   . Diabetes Maternal Uncle   . Diabetes Maternal Grandmother      History  Drug Use No     History  Alcohol Use  . Yes    Comment: occasionally     History  Smoking Status  . Never Smoker  Smokeless Tobacco  . Never Used     Outpatient Encounter Prescriptions as of 07/25/2017  Medication Sig Note  . acetaminophen (TYLENOL) 650 MG CR tablet Take 650 mg by mouth as needed for pain.   [name] Kitchen aspirin 500 MG tablet Take 500 mg by mouth every 6 (six) hours as needed for pain.   [name] Kitchen b complex vitamins tablet Take 1 tablet by mouth daily.   . caffeine 200 MG TABS tablet Take 200 mg by mouth every 4 (four) hours as needed.   . cholecalciferol (VITAMIN D) 1000 UNITS tablet Take 1,000 Units by mouth daily.   . Ferrous Sulfate Dried (SLOW RELEASE IRON) 45 MG  TBCR Take by mouth.   . fexofenadine (ALLEGRA) 180 MG tablet Take 180 mg by mouth daily.   . fluticasone (FLONASE) 50 MCG/ACT nasal spray Place 1 spray into both nostrils daily.   [name] Kitchen glucose blood (BAYER CONTOUR NEXT TEST) test strip Use to check blood sugar 4 times per day.   . ibuprofen (ADVIL) 200 MG tablet 1 tablet as needed 10/25/2015: Received from: [facility]  . Insulin Infusion Pump Supplies (PARADIGM QUICK-SET 32" 6MM) MISC 1 Device by Does not apply route every 3 (three) days.   . Insulin Infusion Pump Supplies (PARADIGM RESERVOIR 3ML) MISC 1 Device by Does not apply route every 3 (three) days.   . insulin lispro (HUMALOG) 100 UNIT/ML injection VIA INSULIN PUMP, total of 90 units/day   . JARDIANCE 25 MG TABS tablet Take 1 tablet by mouth daily.   [name] Kitchen levonorgestrel (MIRENA) 20 MCG/24HR IUD 1 each  by Intrauterine route once.   . mometasone (ELOCON) 0.1 % ointment Apply a pea sized amount topically qd x 1-2 weeks. Not for daily use.   [name] Kitchen omeprazole (PRILOSEC) 20 MG capsule Take 1 capsule (20 mg total) by mouth daily.   [name] Kitchen azithromycin (ZITHROMAX) 250 MG tablet 2 tabs day one.  1 tab days 2-5.   [name] Kitchen Hydrocod Polst-Chlorphen Polst 10-8 MG CP12 Take 1 tablet by mouth 2 (two) times daily as needed.   . [DISCONTINUED] fluconazole (DIFLUCAN) 150 MG tablet One tablet po q 72 hours x 3 doses, then one tablet a week x 6 months.   . [DISCONTINUED] Probiotic Product (PROBIOTIC DAILY PO) Take by mouth.   . [DISCONTINUED] ranitidine (ZANTAC) 150 MG capsule Take 150 mg by mouth 2 (two) times daily.    Facility-Administered Encounter Medications as of 07/25/2017  Medication  . heparin lock flush 100 unit/mL  . sodium chloride 0.9 % injection 10 mL    Allergies: Lipitor [atorvastatin] and Simvastatin  Body mass index is 31.65 kg/m.  Blood pressure 108/71, pulse 73, temperature 98.1 F (36.7 C), temperature source Oral, height 5' 7.75" (1.721 m), weight 206 lb 9.6 oz (93.7 kg), last menstrual period 07/10/2017.      Review of Systems  Constitutional: Positive for fatigue. Negative for activity change, appetite change, chills, diaphoresis, fever and unexpected weight change.  HENT: Positive for congestion, postnasal drip, rhinorrhea, sinus pressure, sneezing and sore throat. Negative for sinus pain, tinnitus, trouble swallowing and voice change.   Eyes: Negative for visual disturbance.  Respiratory: Positive for cough and shortness of breath. Negative for chest tightness, wheezing and stridor.   Cardiovascular: Negative for chest pain, palpitations and leg swelling.  Gastrointestinal: Negative for abdominal distention, abdominal pain, blood in stool, constipation, diarrhea, nausea and vomiting.  Endocrine: Negative for cold intolerance, heat intolerance, polydipsia, polyphagia and polyuria.    Genitourinary: Negative for difficulty urinating, flank pain and hematuria.  Skin: Positive for wound. Negative for color change, pallor and rash.  Neurological: Negative for dizziness and headaches.  Hematological: Does not bruise/bleed easily.  Psychiatric/Behavioral: Positive for sleep disturbance.       Objective:   Physical Exam  Constitutional: She is oriented to person, place, and time. She appears well-developed and well-nourished. No distress.  HENT:  Head: Normocephalic and atraumatic.  Right Ear: Hearing, external ear and ear canal normal. Tympanic membrane is bulging. Tympanic membrane is not erythematous. No decreased hearing is noted.  Left Ear: Tympanic membrane is bulging. Tympanic membrane is not erythematous.  Nose: Mucosal edema and rhinorrhea present. Right sinus  exhibits maxillary sinus tenderness. Right sinus exhibits no frontal sinus tenderness. Left sinus exhibits maxillary sinus tenderness. Left sinus exhibits no frontal sinus tenderness.  Mouth/Throat: Uvula is midline and mucous membranes are normal. Posterior oropharyngeal edema and posterior oropharyngeal erythema present. No oropharyngeal exudate or tonsillar abscesses.  Eyes: Pupils are equal, round, and reactive to light. Conjunctivae are normal.  Neck: Normal range of motion. Neck supple.  Cardiovascular: Normal rate, normal heart sounds and intact distal pulses.   No murmur heard. Pulmonary/Chest: Effort normal and breath sounds normal. No respiratory distress. She has no wheezes. She has no rales. She exhibits no tenderness.  Lymphadenopathy:    She has no cervical adenopathy.  Neurological: She is alert and oriented to person, place, and time. Coordination normal.  Skin: Skin is warm and dry. No rash noted. She is not diaphoretic. No erythema. No pallor.     One circular wound with open center that has yellow slough and clear drainage noted one distal R shin. No streaking or excessive redness noted.    Psychiatric: She has a normal mood and affect. Her behavior is normal. Judgment and thought content normal.  Nursing note and vitals reviewed.         Assessment & Plan:   1. Acute bronchitis, unspecified organism   2. Wound of right lower extremity, initial encounter   3. Acute recurrent maxillary sinusitis     Bronchitis, acute Continue Flonase and Allegra. Please take Augmentin and cough medicine as directed.  No driving or drinking alcohol when using cough medicine. [place] Controlled Substance verified-no contraindications for controlled rx. Increase fluids/rest. If symptoms persist after antibiotic then please call clinic/or send MyChart message.  Leg wound, right Keep clean, dry, covered. Augmentin course provided. Already on Diflucan for yeast inf prophylaxis.  Sinusitis, acute maxillary Augmentin, Flonase, Allegra. Increase fluids/rest.    FOLLOW-UP:  Return if symptoms worsen or fail to improve.

## 2017-07-25 NOTE — Patient Instructions (Addendum)

## 2017-07-25 NOTE — Assessment & Plan Note (Addendum)
Continue Flonase and Allegra. Please take Augmentin and cough medicine as directed.  No driving or drinking alcohol when using cough medicine. [place] Controlled Substance verified-no contraindications for controlled rx. Increase fluids/rest. If symptoms persist after antibiotic then please call clinic/or send MyChart message.

## 2017-07-25 NOTE — Patient Instructions (Signed)

## 2017-07-25 NOTE — Progress Notes (Signed)
GYNECOLOGY  VISIT   HPI: 46 y.o.   Married  Caucasian  female   G1P1001 with Patient's last menstrual period was 07/10/2017.   here for follow up IUD. She had a mirena placed last month for contraception and menorrhagia. She has occasional twinge in the left pelvis region. Getting better. She had a light menses in mid July. She has had slight spotting as well, no bleeding currently. No dyspareunia.  She has GSI, wears a pad daily. She only leaks with cough or sneeze, small amount. Not interested in pelvic floor PT or surgery. The leakage has been going on for years. No frequency, urgency or dysuria.  She is on suppression for yeast, taking her diflucan weekly. If she takes it a few days late she starts feeling irritated.   GYNECOLOGIC HISTORY: Patient's last menstrual period was 07/10/2017. Contraception:IUD (Mirena)  Menopausal hormone therapy: none         OB History    Gravida Para Term Preterm AB Living   1 1 1     1    SAB TAB Ectopic Multiple Live Births           1         Patient Active Problem List   Diagnosis Date Noted  . Ingrown toenail 06/03/2017  . Yeast infection 04/29/2017  . 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 Diabetes ([place]) 08/16/2015  . Dyslipidemia 08/16/2015  . GERD (gastroesophageal reflux disease) 08/16/2015  . Anemia, iron deficiency 08/16/2015    Past Medical History:  Diagnosis Date  . Acne   . Anemia   . Cough 01/27/2016  . Depression   . Diabetes mellitus type 1 ([place])   . Diabetic neuropathy (H[facility])    feet  . Diabetic retinopathy ([place])    bilateral  . Dyslipidemia   . GERD (gastroesophageal reflux disease)   . Infertility, female   . Post-nasal drip 01/27/2016  . Shoulder pain, bilateral    wakes up frequently due to pain  . Stuffy nose 01/27/2016  . Trigger finger of right hand 01/2016   index finger    Past Surgical History:  Procedure Laterality Date  . CESAREAN SECTION  01/17/1995  . INTRAUTERINE DEVICE  (IUD) INSERTION  06/2017   Mirena   . SHOULDER SURGERY Left 06/29/2013   exc. bone spur  . TRIGGER FINGER RELEASE Right 02/02/2016   Procedure: RIGHT INDEX RELEASE TRIGGER  ;  Surgeon: Leanora Cover, MD;  Location: [facility];  Service: Orthopedics;  Laterality: Right;    Current Outpatient Prescriptions  Medication Sig Dispense Refill  . acetaminophen (TYLENOL) 650 MG CR tablet Take 650 mg by mouth as needed for pain.    [name] Kitchen aspirin 500 MG tablet Take 500 mg by mouth every 6 (six) hours as needed for pain.    [name] Kitchen b complex vitamins tablet Take 1 tablet by mouth daily.    . caffeine 200 MG TABS tablet Take 200 mg by mouth every 4 (four) hours as needed.    . cholecalciferol (VITAMIN D) 1000 UNITS tablet Take 1,000 Units by mouth daily.    . Ferrous Sulfate Dried (SLOW RELEASE IRON) 45 MG TBCR Take by mouth.    . fexofenadine (ALLEGRA) 180 MG tablet Take 180 mg by mouth daily.    . fluconazole (DIFLUCAN) 150 MG tablet One tablet po q 72 hours x 3 doses, then one tablet a week x 6 months. 14 tablet 1  . glucose blood (BAYER CONTOUR NEXT  TEST) test strip Use to check blood sugar 4 times per day. 400 each 3  . ibuprofen (ADVIL) 200 MG tablet 1 tablet as needed    . Insulin Infusion Pump Supplies (PARADIGM QUICK-SET 32" 6MM) MISC 1 Device by Does not apply route every 3 (three) days.    . Insulin Infusion Pump Supplies (PARADIGM RESERVOIR 3ML) MISC 1 Device by Does not apply route every 3 (three) days.    . insulin lispro (HUMALOG) 100 UNIT/ML injection VIA INSULIN PUMP, total of 90 units/day 90 mL 3  . JARDIANCE 25 MG TABS tablet Take 1 tablet by mouth daily.    [name] Kitchen levonorgestrel (MIRENA) 20 MCG/24HR IUD 1 each by Intrauterine route once.    . mometasone (ELOCON) 0.1 % ointment Apply a pea sized amount topically qd x 1-2 weeks. Not for daily use. 15 g 0  . omeprazole (PRILOSEC) 20 MG capsule Take 1 capsule (20 mg total) by mouth daily. 90 capsule 2  . Probiotic Product (PROBIOTIC DAILY PO)  Take by mouth.    . ranitidine (ZANTAC) 150 MG capsule Take 150 mg by mouth 2 (two) times daily.     No current facility-administered medications for this visit.    Facility-Administered Medications Ordered in Other Visits  Medication Dose Route Frequency Provider Last Rate Last Dose  . heparin lock flush 100 unit/mL  500 Units Intracatheter Once PRN Brunetta Genera, MD      . sodium chloride 0.9 % injection 10 mL  10 mL Intracatheter PRN Brunetta Genera, MD         ALLERGIES: Lipitor [atorvastatin] and Simvastatin  Family History  Problem Relation Age of Onset  . Diabetes Mother   . Heart attack Mother        Age 75  . Hypertension Mother   . Heart attack Father        Age 20  . Hyperlipidemia Father   . Diabetes Maternal Uncle   . Diabetes Maternal Grandmother     Social History   Social History  . Marital status: Married    Spouse name: N/A  . Number of children: N/A  . Years of education: N/A   Occupational History  . Not on file.   Social History Main Topics  . Smoking status: Never Smoker  . Smokeless tobacco: Never Used  . Alcohol use Yes     Comment: occasionally  . Drug use: No  . Sexual activity: Yes    Partners: Male    Birth control/ protection: IUD   Other Topics Concern  . Not on file   Social History Narrative  . No narrative on file    Review of Systems  Constitutional: Negative.   HENT: Negative.   Eyes: Negative.   Respiratory: Negative.   Cardiovascular: Negative.   Gastrointestinal: Negative.   Genitourinary: Negative.   Musculoskeletal: Negative.   Skin: Negative.   Neurological: Negative.   Endo/Heme/Allergies: Negative.   Psychiatric/Behavioral: Negative.     PHYSICAL EXAMINATION:    BP 102/60 (BP Location: Right Arm, Patient Position: Sitting, Cuff Size: Normal)   Pulse 76   Resp 16   Wt 207 lb (93.9 kg)   LMP 07/10/2017   BMI 31.71 kg/m     General appearance: alert, cooperative and appears stated  age  Pelvic: External genitalia:  no lesions, mild erythema              Urethra:  normal appearing urethra with no masses, tenderness or lesions  Bartholins and Skenes: normal                 Vagina: normal appearing vagina with normal color and discharge, no lesions              Cervix: no lesions and IUD string 2 cm              Bimanual Exam:  Uterus:  normal size, contour, position, consistency, mobility, non-tender and anteverted              Adnexa: no mass, fullness, tenderness               Chaperone was present for exam.  ASSESSMENT IUD check, overall doing well. She had some discomfort initially, currently seems better. Normal exam On suppression for recurrent yeast GSI, for several years, occurs with any cough or sneeze, not daily, but occurs several times a week    PLAN Call if she is having recurrent discomfort Elizebeth Koller out her diflucan suppression Recommended she use vaseline for vulvar skin protection. I think she is getting irritated from the urinary leakage Discussed changing her pad as soon as it is wet [facility] information given She declines PT or surgery at this time   An After Visit Summary was printed and given to the patient.

## 2017-08-05 ENCOUNTER — Other Ambulatory Visit: Payer: Self-pay

## 2017-08-05 MED ORDER — FLUTICASONE PROPIONATE 50 MCG/ACT NA SUSP: 1.0000 | Freq: Every day | NASAL | 2 refills | Status: DC

## 2017-08-05 NOTE — Telephone Encounter (Signed)
We have not prescribed these medications for the patient previously.  Please review and refill if appropriate.  T. Nelson, CMA  

## 2017-09-06 LAB — HM DIABETES EYE EXAM

## 2017-11-08 ENCOUNTER — Other Ambulatory Visit: Payer: Self-pay | Admitting: Obstetrics and Gynecology

## 2018-01-09 ENCOUNTER — Ambulatory Visit (INDEPENDENT_AMBULATORY_CARE_PROVIDER_SITE_OTHER): Payer: BLUE CROSS/BLUE SHIELD | Admitting: Adult Health

## 2018-01-09 ENCOUNTER — Encounter: Payer: Self-pay | Admitting: Adult Health

## 2018-01-09 VITALS — BP 136/81 | HR 72 | Ht 67.75 in | Wt 209.8 lb

## 2018-01-09 DIAGNOSIS — K089 Disorder of teeth and supporting structures, unspecified: Secondary | ICD-10-CM

## 2018-01-09 DIAGNOSIS — Z0000 Encounter for general adult medical examination without abnormal findings: Secondary | ICD-10-CM | POA: Diagnosis not present

## 2018-01-09 DIAGNOSIS — E785 Hyperlipidemia, unspecified: Secondary | ICD-10-CM

## 2018-01-09 DIAGNOSIS — E11319 Type 2 diabetes mellitus with unspecified diabetic retinopathy without macular edema: Secondary | ICD-10-CM

## 2018-01-09 DIAGNOSIS — D508 Other iron deficiency anemias: Secondary | ICD-10-CM

## 2018-01-09 LAB — POCT GLYCOSYLATED HEMOGLOBIN (HGB A1C): Hemoglobin A1C: 8.1

## 2018-01-09 NOTE — Patient Instructions (Addendum)
Diabetes Mellitus and Exercise Exercising regularly is important for your overall health, especially when you have diabetes (diabetes mellitus). Exercising is not only about losing weight. It has many health benefits, such as increasing muscle strength and bone density and reducing body fat and stress. This leads to improved fitness, flexibility, and endurance, all of which result in better overall health. Exercise has additional benefits for people with diabetes, including:  Reducing appetite.  Helping to lower and control blood glucose.  Lowering blood pressure.  Helping to control amounts of fatty substances (lipids) in the blood, such as cholesterol and triglycerides.  Helping the body to respond better to insulin (improving insulin sensitivity).  Reducing how much insulin the body needs.  Decreasing the risk for heart disease by: ? Lowering cholesterol and triglyceride levels. ? Increasing the levels of good cholesterol. ? Lowering blood glucose levels.  What is my activity plan? Your health care provider or certified diabetes educator can help you make a plan for the type and frequency of exercise (activity plan) that works for you. Make sure that you:  Do at least 150 minutes of moderate-intensity or vigorous-intensity exercise each week. This could be brisk walking, biking, or water aerobics. ? Do stretching and strength exercises, such as yoga or weightlifting, at least 2 times a week. ? Spread out your activity over at least 3 days of the week.  Get some form of physical activity every day. ? Do not go more than 2 days in a row without some kind of physical activity. ? Avoid being inactive for more than 90 minutes at a time. Take frequent breaks to walk or stretch.  Choose a type of exercise or activity that you enjoy, and set realistic goals.  Start slowly, and gradually increase the intensity of your exercise over time.  What do I need to know about managing my  diabetes?  Check your blood glucose before and after exercising. ? If your blood glucose is higher than 240 mg/dL (13.3 mmol/L) before you exercise, check your urine for ketones. If you have ketones in your urine, do not exercise until your blood glucose returns to normal.  Know the symptoms of low blood glucose (hypoglycemia) and how to treat it. Your risk for hypoglycemia increases during and after exercise. Common symptoms of hypoglycemia can include: ? Hunger. ? Anxiety. ? Sweating and feeling clammy. ? Confusion. ? Dizziness or feeling light-headed. ? Increased heart rate or palpitations. ? Blurry vision. ? Tingling or numbness around the mouth, lips, or tongue. ? Tremors or shakes. ? Irritability.  Keep a rapid-acting carbohydrate snack available before, during, and after exercise to help prevent or treat hypoglycemia.  Avoid injecting insulin into areas of the body that are going to be exercised. For example, avoid injecting insulin into: ? The arms, when playing tennis. ? The legs, when jogging.  Keep records of your exercise habits. Doing this can help you and your health care provider adjust your diabetes management plan as needed. Write down: ? Food that you eat before and after you exercise. ? Blood glucose levels before and after you exercise. ? The type and amount of exercise you have done. ? When your insulin is expected to peak, if you use insulin. Avoid exercising at times when your insulin is peaking.  When you start a new exercise or activity, work with your health care provider to make sure the activity is safe for you, and to adjust your insulin, medicines, or food intake as needed.    Drink plenty of water while you exercise to prevent dehydration or heat stroke. Drink enough fluid to keep your urine clear or pale yellow. This information is not intended to replace advice given to you by your health care provider. Make sure you discuss any questions you have with  your health care provider. Document Released: 03/01/2004 Document Revised: 06/29/2016 Document Reviewed: 05/21/2016 Elsevier Interactive Patient Education  2018 [employer].   Diabetes Mellitus and Nutrition When you have diabetes (diabetes mellitus), it is very important to have healthy eating habits because your blood sugar (glucose) levels are greatly affected by what you eat and drink. Eating healthy foods in the appropriate amounts, at about the same times every day, can help you:  Control your blood glucose.  Lower your risk of heart disease.  Improve your blood pressure.  Reach or maintain a healthy weight.  Every person with diabetes is different, and each person has different needs for a meal plan. Your health care provider may recommend that you work with a diet and nutrition specialist (dietitian) to make a meal plan that is best for you. Your meal plan may vary depending on factors such as:  The calories you need.  The medicines you take.  Your weight.  Your blood glucose, blood pressure, and cholesterol levels.  Your activity level.  Other health conditions you have, such as heart or kidney disease.  How do carbohydrates affect me? Carbohydrates affect your blood glucose level more than any other type of food. Eating carbohydrates naturally increases the amount of glucose in your blood. Carbohydrate counting is a method for keeping track of how many carbohydrates you eat. Counting carbohydrates is important to keep your blood glucose at a healthy level, especially if you use insulin or take certain oral diabetes medicines. It is important to know how many carbohydrates you can safely have in each meal. This is different for every person. Your dietitian can help you calculate how many carbohydrates you should have at each meal and for snack. Foods that contain carbohydrates include:  Bread, cereal, rice, pasta, and crackers.  Potatoes and corn.  Peas, beans, and  lentils.  Milk and yogurt.  Fruit and juice.  Desserts, such as cakes, cookies, ice cream, and candy.  How does alcohol affect me? Alcohol can cause a sudden decrease in blood glucose (hypoglycemia), especially if you use insulin or take certain oral diabetes medicines. Hypoglycemia can be a life-threatening condition. Symptoms of hypoglycemia (sleepiness, dizziness, and confusion) are similar to symptoms of having too much alcohol. If your health care provider says that alcohol is safe for you, follow these guidelines:  Limit alcohol intake to no more than 1 drink per day for nonpregnant women and 2 drinks per day for men. One drink equals 12 oz of beer, 5 oz of wine, or 1 oz of hard liquor.  Do not drink on an empty stomach.  Keep yourself hydrated with water, diet soda, or unsweetened iced tea.  Keep in mind that regular soda, juice, and other mixers may contain a lot of sugar and must be counted as carbohydrates.  What are tips for following this plan? Reading food labels  Start by checking the serving size on the label. The amount of calories, carbohydrates, fats, and other nutrients listed on the label are based on one serving of the food. Many foods contain more than one serving per package.  Check the total grams (g) of carbohydrates in one serving. You can calculate the number of  servings of carbohydrates in one serving by dividing the total carbohydrates by 15. For example, if a food has 30 g of total carbohydrates, it would be equal to 2 servings of carbohydrates.  Check the number of grams (g) of saturated and trans fats in one serving. Choose foods that have low or no amount of these fats.  Check the number of milligrams (mg) of sodium in one serving. Most people should limit total sodium intake to less than 2,300 mg per day.  Always check the nutrition information of foods labeled as "low-fat" or "nonfat". These foods may be higher in added sugar or refined carbohydrates  and should be avoided.  Talk to your dietitian to identify your daily goals for nutrients listed on the label. Shopping  Avoid buying canned, premade, or processed foods. These foods tend to be high in fat, sodium, and added sugar.  Shop around the outside edge of the grocery store. This includes fresh fruits and vegetables, bulk grains, fresh meats, and fresh dairy. Cooking  Use low-heat cooking methods, such as baking, instead of high-heat cooking methods like deep frying.  Cook using healthy oils, such as olive, canola, or sunflower oil.  Avoid cooking with butter, cream, or high-fat meats. Meal planning  Eat meals and snacks regularly, preferably at the same times every day. Avoid going long periods of time without eating.  Eat foods high in fiber, such as fresh fruits, vegetables, beans, and whole grains. Talk to your dietitian about how many servings of carbohydrates you can eat at each meal.  Eat 4-6 ounces of lean protein each day, such as lean meat, chicken, fish, eggs, or tofu. 1 ounce is equal to 1 ounce of meat, chicken, or fish, 1 egg, or 1/4 cup of tofu.  Eat some foods each day that contain healthy fats, such as avocado, nuts, seeds, and fish. Lifestyle   Check your blood glucose regularly.  Exercise at least 30 minutes 5 or more days each week, or as told by your health care provider.  Take medicines as told by your health care provider.  Do not use any products that contain nicotine or tobacco, such as cigarettes and e-cigarettes. If you need help quitting, ask your health care provider.  Work with a Social worker or diabetes educator to identify strategies to manage stress and any emotional and social challenges. What are some questions to ask my health care provider?  Do I need to meet with a diabetes educator?  Do I need to meet with a dietitian?  What number can I call if I have questions?  When are the best times to check my blood glucose? Where to find  more information:  American Diabetes Association: diabetes.org/food-and-fitness/food  Academy of Nutrition and Dietetics: PokerClues.dk  [employer] of Diabetes and Digestive and Kidney Diseases (NIH): ContactWire.be Summary  A healthy meal plan will help you control your blood glucose and maintain a healthy lifestyle.  Working with a diet and nutrition specialist (dietitian) can help you make a meal plan that is best for you.  Keep in mind that carbohydrates and alcohol have immediate effects on your blood glucose levels. It is important to count carbohydrates and to use alcohol carefully. This information is not intended to replace advice given to you by your health care provider. Make sure you discuss any questions you have with your health care provider. Document Released: 09/06/2005 Document Revised: 01/14/2017 Document Reviewed: 01/14/2017 Elsevier Interactive Patient Education  [employer].  Please continue all  medications as directed. Increase water intake, strive for at least 100 ounces/day.   Follow Diabetic diet, make sure you eat dinner :)  Increase regular exercise.  Recommend at least 30 minutes daily, 5 days per week of walking, jogging, biking, swimming, YouTube/Pinterest workout videos. Please make an appt for fasting labs. Please make appt for your Endocrinologist and Dentist. Follow-up 6 months (since you are followed by Endocrinologist), sooner if needed. NICE TO SEE YOU!

## 2018-01-09 NOTE — Progress Notes (Signed)
Subjective:    Patient ID: Cynthia Moody, female    DOB: 03-19-1971, 47 y.o.   MRN: [id number]  HPI: 04/29/17 OV:  Cynthia Moody presents to establish as a new pt.  She is a pleasant 47 year old female.  PMH:  T1D, HL, GERD, and chronic anemia.  She has an implantable insulin pump that will be changed out b/c current device is recently out of warranty.  She estimates using 60 units humalog insulin daily.  Recent addition of Jardiance 25mg  daily has helped normalize blodd sugar, today A1c is 8.2. She walks 3 miles a week (1 mile a day at lunch when at work) and that is only exercise she gets.  She reports her diet "is decent", however she still consumes sugar/CHO.  Only acute issues:  1) R Knee pain (2/10) that spontaneously developed last week, denies acute injury/trauma.  2) Active yeast infection She is married, one grown child and is employed at [employer].  01/09/18 OV: Cynthia Moody presents for CPE. She is compliant on all medication, denies SE. Her A1c today is 8.1 Her implantable pump administers 65-90 U daily of insulin lispro and also take jardiance 25mg  daily. If she skips dinner or does not eat enough she will routinely be awoken with a low BS- 50-70s, she estimates that this happens 4/7 mornings/week.  She has not seen her Endocrinologist since May- instructed to make appt ASAP. She tries to follow Diabetic diet and walks 20-76mins/three days a week. She has very poor dentition and has already had several teeth pulled and has a few "loose tooth now that hurt".  She has not seen her Dentist >6 months.  Several of her family members (to include her younger brother) have sig periodontal disease and now have dentures.  Lab Results  Component Value Date   HGBA1C 8.1 01/09/2018   HGBA1C 8.2 04/29/2017   HGBA1C 8.8 03/27/2016     Patient Care Team    Relationship Specialty Notifications Start End  Esaw Grandchild, NP PCP - General Family Medicine  04/29/17     Patient Active  Problem List   Diagnosis Date Noted  . Healthcare maintenance 01/09/2018  . Poor dentition 01/09/2018  . Bronchitis, acute 07/25/2017  . Leg wound, right 07/25/2017  . Sinusitis, acute maxillary 07/25/2017  . Ingrown toenail 06/03/2017  . Yeast infection 04/29/2017  . Diabetes mellitus with ophthalmic complication ([place]) 67/11/4579  . Diabetes ([place]) 08/16/2015  . Dyslipidemia 08/16/2015  . GERD (gastroesophageal reflux disease) 08/16/2015  . Anemia, iron deficiency 08/16/2015     Past Medical History:  Diagnosis Date  . Acne   . Anemia   . Cough 01/27/2016  . Depression   . Diabetes mellitus type 1 ([place])   . Diabetic neuropathy ([facility])    feet  . Diabetic retinopathy ([place])    bilateral  . Dyslipidemia   . GERD (gastroesophageal reflux disease)   . Infertility, female   . Post-nasal drip 01/27/2016  . Shoulder pain, bilateral    wakes up frequently due to pain  . Stuffy nose 01/27/2016  . Trigger finger of right hand 01/2016   index finger     Past Surgical History:  Procedure Laterality Date  . CESAREAN SECTION  01/17/1995  . INTRAUTERINE DEVICE (IUD) INSERTION  06/2017   Mirena   . SHOULDER SURGERY Left 06/29/2013   exc. bone spur  . TRIGGER FINGER RELEASE Right 02/02/2016   Procedure: RIGHT INDEX RELEASE TRIGGER  ;  Surgeon:  Leanora Cover, MD;  Location: [facility];  Service: Orthopedics;  Laterality: Right;     Family History  Problem Relation Age of Onset  . Diabetes Mother   . Heart attack Mother        Age 86  . Hypertension Mother   . Heart attack Father        Age 4  . Hyperlipidemia Father   . Diabetes Maternal Uncle   . Diabetes Maternal Grandmother      Social History   Substance and Sexual Activity  Drug Use No     Social History   Substance and Sexual Activity  Alcohol Use Yes   Comment: occasionally     Social History   Tobacco Use  Smoking Status Never Smoker  Smokeless Tobacco Never Used     Outpatient Encounter  Medications as of 01/09/2018  Medication Sig Note  . acetaminophen (TYLENOL) 650 MG CR tablet Take 650 mg by mouth as needed for pain.   [name] Kitchen aspirin 500 MG tablet Take 500 mg by mouth every 6 (six) hours as needed for pain.   [name] Kitchen b complex vitamins tablet Take 1 tablet by mouth daily.   . caffeine 200 MG TABS tablet Take 200 mg by mouth every 4 (four) hours as needed.   . cholecalciferol (VITAMIN D) 1000 UNITS tablet Take 1,000 Units by mouth daily.   . Ferrous Sulfate Dried (SLOW RELEASE IRON) 45 MG TBCR Take by mouth.   . fexofenadine (ALLEGRA) 180 MG tablet Take 180 mg by mouth daily.   . fluticasone (FLONASE) 50 MCG/ACT nasal spray Place 1 spray into both nostrils daily.   [name] Kitchen glucose blood (BAYER CONTOUR NEXT TEST) test strip Use to check blood sugar 4 times per day.   . ibuprofen (ADVIL) 200 MG tablet 1 tablet as needed 10/25/2015: Received from: [facility]  . Insulin Infusion Pump Supplies (PARADIGM QUICK-SET 32" 6MM) MISC 1 Device by Does not apply route every 3 (three) days.   . Insulin Infusion Pump Supplies (PARADIGM RESERVOIR 3ML) MISC 1 Device by Does not apply route every 3 (three) days.   . insulin lispro (HUMALOG) 100 UNIT/ML injection VIA INSULIN PUMP, total of 90 units/day   . JARDIANCE 25 MG TABS tablet Take 1 tablet by mouth daily.   [name] Kitchen levonorgestrel (MIRENA) 20 MCG/24HR IUD 1 each by Intrauterine route once.   [name] Kitchen omeprazole (PRILOSEC) 20 MG capsule Take 1 capsule (20 mg total) by mouth daily.   . [DISCONTINUED] chlorpheniramine-HYDROcodone (TUSSIONEX PENNKINETIC ER) 10-8 MG/5ML SUER Take 5 mLs by mouth every 12 (twelve) hours as needed for cough.   . [DISCONTINUED] mometasone (ELOCON) 0.1 % ointment Apply a pea sized amount topically qd x 1-2 weeks. Not for daily use.   . [DISCONTINUED] amoxicillin-clavulanate (AUGMENTIN) 875-125 MG tablet Take 1 tablet by mouth 2 (two) times daily.    Facility-Administered Encounter Medications as of 01/09/2018  Medication  .  heparin lock flush 100 unit/mL  . sodium chloride 0.9 % injection 10 mL    Allergies: Lipitor [atorvastatin] and Simvastatin  Body mass index is 32.14 kg/m.  Blood pressure 136/81, pulse 72, height 5' 7.75" (1.721 m), weight 209 lb 12.8 oz (95.2 kg), SpO2 97 %.   Review of Systems  Constitutional: Negative for activity change, appetite change, chills, diaphoresis, fatigue, fever and unexpected weight change.  HENT: Negative for congestion.   Eyes: Negative for visual disturbance.  Respiratory: Negative for cough, chest tightness, shortness of breath, wheezing and stridor.  Cardiovascular: Negative for chest pain, palpitations and leg swelling.  Gastrointestinal: Negative for abdominal distention, abdominal pain, blood in stool, constipation, diarrhea, nausea and vomiting.  Endocrine: Negative for cold intolerance, heat intolerance, polydipsia, polyphagia and polyuria.  Genitourinary: Negative for difficulty urinating, flank pain and hematuria.  Musculoskeletal: Positive for arthralgias. Negative for back pain, gait problem, joint swelling, myalgias, neck pain and neck stiffness.  Skin: Negative for color change, pallor, rash and wound.  Neurological: Negative for dizziness, tremors, weakness, light-headedness and headaches.       Objective:   Physical Exam  Constitutional: She is oriented to person, place, and time. She appears well-developed and well-nourished. No distress.  HENT:  Head: Normocephalic and atraumatic.  Right Ear: Hearing, external ear and ear canal normal. Tympanic membrane is not erythematous and not bulging. No decreased hearing is noted.  Left Ear: Hearing, tympanic membrane, external ear and ear canal normal. Tympanic membrane is not erythematous and not bulging. No decreased hearing is noted.  Nose: Nose normal. Right sinus exhibits no maxillary sinus tenderness and no frontal sinus tenderness. Left sinus exhibits no maxillary sinus tenderness and no frontal  sinus tenderness.  Mouth/Throat: Mucous membranes are normal. Abnormal dentition. Dental caries present. No oropharyngeal exudate, posterior oropharyngeal edema, posterior oropharyngeal erythema or tonsillar abscesses.  Very narrow right canal with cerumen build up.  Eyes: Conjunctivae are normal. Pupils are equal, round, and reactive to light.  Neck: Normal range of motion. Neck supple.  Cardiovascular: Normal rate, regular rhythm, normal heart sounds and intact distal pulses.  No murmur heard. Pulmonary/Chest: Effort normal and breath sounds normal. No respiratory distress. She has no wheezes. She has no rales. She exhibits no tenderness. Right breast exhibits no inverted nipple and no mass. Left breast exhibits no inverted nipple and no mass.  Abdominal: Soft. Bowel sounds are normal. She exhibits no distension and no mass. There is no tenderness. There is no rebound and no guarding.  Genitourinary:  Genitourinary Comments: PAP not due  Musculoskeletal: Normal range of motion. She exhibits no edema or tenderness.  Lymphadenopathy:    She has no cervical adenopathy.  Neurological: She is alert and oriented to person, place, and time. Coordination normal.  Skin: Skin is warm and dry. No rash noted. She is not diaphoretic. No erythema. No pallor.  Psychiatric: She has a normal mood and affect. Her behavior is normal. Judgment and thought content normal.  Nursing note and vitals reviewed.         Assessment & Plan:   1. 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 Dyslipidemia   3. Other iron deficiency anemia   4. Healthcare maintenance   5. Poor dentition     Healthcare maintenance Please continue all medications as directed. Increase water intake, strive for at least 100 ounces/day.   Follow Diabetic diet, make sure you eat dinner :)  Increase regular  exercise.  Recommend at least 30 minutes daily, 5 days per week of walking, jogging, biking, swimming, YouTube/Pinterest workout videos. Please make an appt for fasting labs. Please make appt for your Endocrinologist and Dentist. Follow-up 6 months (since you are followed by Endocrinologist), sooner if needed.  Dyslipidemia Will check lipid panel ASAP  Diabetes Lab Results  Component Value Date   HGBA1C 8.1 01/09/2018   HGBA1C 8.2 04/29/2017   HGBA1C 8.8 03/27/2016   Has implantable insulin pump which delivers on avg 65-90  U daily of insulin lispro She experiences low BS 4/7 mornings a week, r/t not eating properly the night before. Advised to see Endocrinologist ASAP and to follow Diabetic diet.  Poor dentition Brush/floss daily Advised to call her Dentist to discuss dental extraction and bridges/dentures  Anemia, iron deficiency Will check CBC ASAP    FOLLOW-UP:  Return in about 6 months (around 07/09/2018) for Regular Follow Up.

## 2018-01-09 NOTE — Assessment & Plan Note (Signed)
Brush/floss daily Advised to call her Dentist to discuss dental extraction and bridges/dentures

## 2018-01-09 NOTE — Assessment & Plan Note (Signed)
Please continue all medications as directed. Increase water intake, strive for at least 100 ounces/day.   Follow Diabetic diet, make sure you eat dinner :)  Increase regular exercise.  Recommend at least 30 minutes daily, 5 days per week of walking, jogging, biking, swimming, YouTube/Pinterest workout videos. Please make an appt for fasting labs. Please make appt for your Endocrinologist and Dentist. Follow-up 6 months (since you are followed by Endocrinologist), sooner if needed.

## 2018-01-09 NOTE — Assessment & Plan Note (Signed)
Will check CBC ASAP

## 2018-01-09 NOTE — Assessment & Plan Note (Signed)
Lab Results  Component Value Date   HGBA1C 8.1 01/09/2018   HGBA1C 8.2 04/29/2017   HGBA1C 8.8 03/27/2016   Has implantable insulin pump which delivers on avg 65-90 U daily of insulin lispro She experiences low BS 4/7 mornings a week, r/t not eating properly the night before. Advised to see Endocrinologist ASAP and to follow Diabetic diet.

## 2018-01-09 NOTE — Assessment & Plan Note (Signed)
Will check lipid panel ASAP

## 2018-01-10 ENCOUNTER — Other Ambulatory Visit: Payer: BLUE CROSS/BLUE SHIELD

## 2018-01-10 DIAGNOSIS — D508 Other iron deficiency anemias: Secondary | ICD-10-CM

## 2018-01-10 DIAGNOSIS — E11319 Type 2 diabetes mellitus with unspecified diabetic retinopathy without macular edema: Secondary | ICD-10-CM

## 2018-01-10 DIAGNOSIS — E785 Hyperlipidemia, unspecified: Secondary | ICD-10-CM

## 2018-01-10 DIAGNOSIS — Z0000 Encounter for general adult medical examination without abnormal findings: Secondary | ICD-10-CM

## 2018-01-11 LAB — COMPREHENSIVE METABOLIC PANEL
ALT: 10 IU/L (ref 0–32)
AST: 13 IU/L (ref 0–40)
Albumin/Globulin Ratio: 1.9 (ref 1.2–2.2)
Albumin: 4.3 g/dL (ref 3.5–5.5)
Alkaline Phosphatase: 81 IU/L (ref 39–117)
BUN/Creatinine Ratio: 14 (ref 9–23)
BUN: 14 mg/dL (ref 6–24)
Bilirubin Total: 0.6 mg/dL (ref 0.0–1.2)
CALCIUM: 9 mg/dL (ref 8.7–10.2)
CO2: 23 mmol/L (ref 20–29)
CREATININE: 0.97 mg/dL (ref 0.57–1.00)
Chloride: 100 mmol/L (ref 96–106)
GFR calc Af Amer: 82 mL/min/{1.73_m2} (ref >59)
GFR, EST NON AFRICAN AMERICAN: 71 mL/min/{1.73_m2} (ref >59)
GLUCOSE: 86 mg/dL (ref 65–99)
Globulin, Total: 2.3 g/dL (ref 1.5–4.5)
Potassium: 4.4 mmol/L (ref 3.5–5.2)
SODIUM: 140 mmol/L (ref 134–144)
Total Protein: 6.6 g/dL (ref 6.0–8.5)

## 2018-01-11 LAB — CBC WITH DIFFERENTIAL/PLATELET
BASOS: 0 %
Basophils Absolute: 0 10*3/uL (ref 0.0–0.2)
EOS (ABSOLUTE): 0.1 10*3/uL (ref 0.0–0.4)
EOS: 2 %
HEMATOCRIT: 40.8 % (ref 34.0–46.6)
HEMOGLOBIN: 13.3 g/dL (ref 11.1–15.9)
IMMATURE GRANULOCYTES: 0 %
Immature Grans (Abs): 0 10*3/uL (ref 0.0–0.1)
Lymphocytes Absolute: 2.4 10*3/uL (ref 0.7–3.1)
Lymphs: 39 %
MCH: 29.3 pg (ref 26.6–33.0)
MCHC: 32.6 g/dL (ref 31.5–35.7)
MCV: 90 fL (ref 79–97)
MONOCYTES: 8 %
MONOS ABS: 0.5 10*3/uL (ref 0.1–0.9)
NEUTROS PCT: 51 %
Neutrophils Absolute: 3.1 10*3/uL (ref 1.4–7.0)
Platelets: 374 10*3/uL (ref 150–379)
RBC: 4.54 x10E6/uL (ref 3.77–5.28)
RDW: 13 % (ref 12.3–15.4)
WBC: 6.1 10*3/uL (ref 3.4–10.8)

## 2018-01-11 LAB — LIPID PANEL
Chol/HDL Ratio: 3.6 ratio (ref 0.0–4.4)
Cholesterol, Total: 150 mg/dL (ref 100–199)
HDL: 42 mg/dL (ref >39)
LDL Calculated: 96 mg/dL (ref 0–99)
TRIGLYCERIDES: 61 mg/dL (ref 0–149)
VLDL Cholesterol Cal: 12 mg/dL (ref 5–40)

## 2018-01-11 LAB — TSH: TSH: 0.614 u[IU]/mL (ref 0.450–4.500)

## 2018-01-11 LAB — HEMOGLOBIN A1C
Est. average glucose Bld gHb Est-mCnc: 183 mg/dL
Hgb A1c MFr Bld: 8 % — ABNORMAL HIGH (ref 4.8–5.6)

## 2018-01-11 LAB — VITAMIN D 25 HYDROXY (VIT D DEFICIENCY, FRACTURES): Vit D, 25-Hydroxy: 27.4 ng/mL — ABNORMAL LOW (ref 30.0–100.0)

## 2018-01-13 ENCOUNTER — Other Ambulatory Visit: Payer: Self-pay | Admitting: Adult Health

## 2018-01-13 DIAGNOSIS — E559 Vitamin D deficiency, unspecified: Secondary | ICD-10-CM

## 2018-01-13 MED ORDER — VITAMIN D (ERGOCALCIFEROL) 1.25 MG (50000 UNIT) PO CAPS: 50000.0000 [IU] | ORAL_CAPSULE | ORAL | 0 refills | Status: DC

## 2018-03-03 DIAGNOSIS — E11319 Type 2 diabetes mellitus with unspecified diabetic retinopathy without macular edema: Secondary | ICD-10-CM | POA: Diagnosis not present

## 2018-03-03 DIAGNOSIS — E1042 Type 1 diabetes mellitus with diabetic polyneuropathy: Secondary | ICD-10-CM | POA: Diagnosis not present

## 2018-03-03 DIAGNOSIS — E6609 Other obesity due to excess calories: Secondary | ICD-10-CM | POA: Diagnosis not present

## 2018-03-03 DIAGNOSIS — E785 Hyperlipidemia, unspecified: Secondary | ICD-10-CM | POA: Diagnosis not present

## 2018-03-03 DIAGNOSIS — Z9641 Presence of insulin pump (external) (internal): Secondary | ICD-10-CM | POA: Diagnosis not present

## 2018-03-03 DIAGNOSIS — Z6834 Body mass index (BMI) 34.0-34.9, adult: Secondary | ICD-10-CM | POA: Diagnosis not present

## 2018-03-13 DIAGNOSIS — J019 Acute sinusitis, unspecified: Secondary | ICD-10-CM | POA: Diagnosis not present

## 2018-03-30 ENCOUNTER — Encounter: Payer: Self-pay | Admitting: Adult Health

## 2018-05-02 ENCOUNTER — Encounter: Payer: Self-pay | Admitting: Adult Health

## 2018-05-08 ENCOUNTER — Other Ambulatory Visit: Payer: Self-pay | Admitting: Adult Health

## 2018-05-21 ENCOUNTER — Ambulatory Visit: Payer: 59 | Admitting: Obstetrics and Gynecology

## 2018-05-21 ENCOUNTER — Encounter: Payer: Self-pay | Admitting: Obstetrics and Gynecology

## 2018-05-21 ENCOUNTER — Ambulatory Visit: Payer: BLUE CROSS/BLUE SHIELD | Admitting: Adult Health

## 2018-05-21 ENCOUNTER — Other Ambulatory Visit: Payer: Self-pay

## 2018-05-21 VITALS — BP 100/56 | HR 74 | Resp 14 | Ht 67.75 in | Wt 211.4 lb

## 2018-05-21 DIAGNOSIS — N393 Stress incontinence (female) (male): Secondary | ICD-10-CM

## 2018-05-21 DIAGNOSIS — N763 Subacute and chronic vulvitis: Secondary | ICD-10-CM

## 2018-05-21 DIAGNOSIS — Z01419 Encounter for gynecological examination (general) (routine) without abnormal findings: Secondary | ICD-10-CM

## 2018-05-21 DIAGNOSIS — Z30431 Encounter for routine checking of intrauterine contraceptive device: Secondary | ICD-10-CM | POA: Diagnosis not present

## 2018-05-21 NOTE — Patient Instructions (Signed)
EXERCISE AND DIET:  We recommended that you start or continue a regular exercise program for good health. Regular exercise means any activity that makes your heart beat faster and makes you sweat.  We recommend exercising at least 30 minutes per day at least 3 days a week, preferably 4 or 5.  We also recommend a diet low in fat and sugar.  Inactivity, poor dietary choices and obesity can cause diabetes, heart attack, stroke, and kidney damage, among others.    ALCOHOL AND SMOKING:  Women should limit their alcohol intake to no more than 7 drinks/beers/glasses of wine (combined, not each!) per week. Moderation of alcohol intake to this level decreases your risk of breast cancer and liver damage. And of course, no recreational drugs are part of a healthy lifestyle.  And absolutely no smoking or even second hand smoke. Most people know smoking can cause heart and lung diseases, but did you know it also contributes to weakening of your bones? Aging of your skin?  Yellowing of your teeth and nails?  CALCIUM AND VITAMIN D:  Adequate intake of calcium and Vitamin D are recommended.  The recommendations for exact amounts of these supplements seem to change often, but generally speaking 600 mg of calcium (either carbonate or citrate) and 800 units of Vitamin D per day seems prudent. Certain women may benefit from higher intake of Vitamin D.  If you are among these women, your doctor will have told you during your visit.    PAP SMEARS:  Pap smears, to check for cervical cancer or precancers,  have traditionally been done yearly, although recent scientific advances have shown that most women can have pap smears less often.  However, every woman still should have a physical exam from her gynecologist every year. It will include a breast check, inspection of the vulva and vagina to check for abnormal growths or skin changes, a visual exam of the cervix, and then an exam to evaluate the size and shape of the uterus and  ovaries.  And after 45 years of age, a rectal exam is indicated to check for rectal cancers. We will also provide age appropriate advice regarding health maintenance, like when you should have certain vaccines, screening for sexually transmitted diseases, bone density testing, colonoscopy, mammograms, etc.   MAMMOGRAMS:  All women over 40 years old should have a yearly mammogram. Many facilities now offer a "3D" mammogram, which may cost around $50 extra out of pocket. If possible,  we recommend you accept the option to have the 3D mammogram performed.  It both reduces the number of women who will be called back for extra views which then turn out to be normal, and it is better than the routine mammogram at detecting truly abnormal areas.    COLONOSCOPY:  Colonoscopy to screen for colon cancer is recommended for all women at age 50.  We know, you hate the idea of the prep.  We agree, BUT, having colon cancer and not knowing it is worse!!  Colon cancer so often starts as a polyp that can be seen and removed at colonscopy, which can quite literally save your life!  And if your first colonoscopy is normal and you have no family history of colon cancer, most women don't have to have it again for 10 years.  Once every ten years, you can do something that may end up saving your life, right?  We will be happy to help you get it scheduled when you are ready.    Be sure to check your insurance coverage so you understand how much it will cost.  It may be covered as a preventative service at no cost, but you should check your particular policy.      Kegel Exercises Kegel exercises help strengthen the muscles that support the rectum, vagina, small intestine, bladder, and uterus. Doing Kegel exercises can help:  Improve bladder and bowel control.  Improve sexual response.  Reduce problems and discomfort during pregnancy.  Kegel exercises involve squeezing your pelvic floor muscles, which are the same muscles you  squeeze when you try to stop the flow of urine. The exercises can be done while sitting, standing, or lying down, but it is best to vary your position. Phase 1 exercises 1. Squeeze your pelvic floor muscles tight. You should feel a tight lift in your rectal area. If you are a female, you should also feel a tightness in your vaginal area. Keep your stomach, buttocks, and legs relaxed. 2. Hold the muscles tight for up to 10 seconds. 3. Relax your muscles. Repeat this exercise 50 times a day or as many times as told by your health care provider. Continue to do this exercise for at least 4-6 weeks or for as long as told by your health care provider. This information is not intended to replace advice given to you by your health care provider. Make sure you discuss any questions you have with your health care provider. Document Released: 11/26/2012 Document Revised: 08/04/2016 Document Reviewed: 10/30/2015 Elsevier Interactive Patient Education  2018 Elsevier Inc.  

## 2018-05-21 NOTE — Progress Notes (Signed)
47 y.o. G1P1001 MarriedCaucasianF here for annual exam. She has a mirena IUD, inserted in 7/18 for contraception and cycle control.  No cycle since last fall, slight spotting this week. No longer anemic. Sexually active, no change in intermittent entry dyspareunia. She hasn't tried a lubricant. She thinks the irritation occurs from recurrent yeast. She is on diflucan every other week, long term secondary to her diabetes and medication. Keeps her symptoms at [place]. She last took diflucan on Sunday, itching has improved.   HgbA1C, was 7.6% in March (improvement, has been as 15). H/O GSI since her son was born, no change.     No LMP recorded. (Menstrual status: IUD).          Sexually active: Yes.    The current method of family planning is IUD.    Exercising: Yes.    walking Smoker:  no  Health Maintenance: Pap:  05/15/17 negative, HR HPV negative, 10/18/15 negative, HR HPV negative  History of abnormal Pap:  no MMG:  10/01/16 BIRADS 1 negative  Colonoscopy: 2011- small polyps per patient, done secondary to anemia.  BMD:   none TDaP:  10/01/10 Gardasil: n/a   reports that she has never smoked. She has never used smokeless tobacco. She reports that she drinks alcohol. She reports that she does not use drugs. Very infrequent ETOH. Works as a patient account rep. Son is 67 and lives at home.   Past Medical History:  Diagnosis Date  . Acne   . Anemia   . Cough 01/27/2016  . Depression   . Diabetes mellitus type 1 ([place])   . Diabetic neuropathy (HCC)    feet  . Diabetic retinopathy ([place])    bilateral  . Dyslipidemia   . GERD (gastroesophageal reflux disease)   . Infertility, female   . Post-nasal drip 01/27/2016  . Shoulder pain, bilateral    wakes up frequently due to pain  . Stuffy nose 01/27/2016  . Trigger finger of right hand 01/2016   index finger    Past Surgical History:  Procedure Laterality Date  . CESAREAN SECTION  01/17/1995  . INTRAUTERINE DEVICE (IUD) INSERTION  06/2017    Mirena   . SHOULDER SURGERY Left 06/29/2013   exc. bone spur  . TRIGGER FINGER RELEASE Right 02/02/2016   Procedure: RIGHT INDEX RELEASE TRIGGER  ;  Surgeon: Leanora Cover, MD;  Location: [facility];  Service: Orthopedics;  Laterality: Right;    Current Outpatient Medications  Medication Sig Dispense Refill  . acetaminophen (TYLENOL) 650 MG CR tablet Take 650 mg by mouth as needed for pain.    [name] Kitchen aspirin 500 MG tablet Take 500 mg by mouth every 6 (six) hours as needed for pain.    [name] Kitchen b complex vitamins tablet Take 1 tablet by mouth daily.    . caffeine 200 MG TABS tablet Take 200 mg by mouth every 4 (four) hours as needed.    . cholecalciferol (VITAMIN D) 1000 UNITS tablet Take 1,000 Units by mouth daily.    . Continuous Blood Gluc Sensor (FREESTYLE LIBRE 14 DAY SENSOR) MISC USE 1 UNITS EVERY 14 DAYS. TO CHECK BLLOD SUGAR CHENGE THE UNIT EVERY 14 DAYS  11  . empagliflozin (JARDIANCE) 25 MG TABS tablet TAKE 1 TABLET BY MOUTH EVERY DAY    . Ferrous Sulfate Dried (SLOW RELEASE IRON) 45 MG TBCR Take by mouth.    . fexofenadine (ALLEGRA) 180 MG tablet Take 180 mg by mouth daily.    . fluconazole (  DIFLUCAN) 150 MG tablet TAKE 1 TABLET ONCE WEEKLY AS NEEDED  1  . fluticasone (FLONASE) 50 MCG/ACT nasal spray PLACE 1 SPRAY INTO BOTH NOSTRILS DAILY. 16 g 2  . glucose blood (BAYER CONTOUR NEXT TEST) test strip Use to check blood sugar 4 times per day. 400 each 3  . ibuprofen (ADVIL) 200 MG tablet 1 tablet as needed    . Insulin Infusion Pump Supplies (AUTOSOFT 90 INFUSION SET) MISC CHANGE EVERY 2-3 DAYS    . Insulin Infusion Pump Supplies (PARADIGM QUICK-SET 32" 6MM) MISC 1 Device by Does not apply route every 3 (three) days.    . Insulin Infusion Pump Supplies (PARADIGM RESERVOIR 3ML) MISC 1 Device by Does not apply route every 3 (three) days.    . insulin lispro (HUMALOG) 100 UNIT/ML injection VIA INSULIN PUMP, total of 90 units/day 90 mL 3  . levonorgestrel (MIRENA) 20 MCG/24HR IUD 1 each by  Intrauterine route once.    [name] Kitchen omeprazole (PRILOSEC) 20 MG capsule Take 1 capsule (20 mg total) by mouth daily. 90 capsule 2  . Vitamin D, Ergocalciferol, (DRISDOL) 50000 units CAPS capsule Take 1 capsule (50,000 Units total) by mouth every 7 (seven) days. 16 capsule 0   No current facility-administered medications for this visit.    Facility-Administered Medications Ordered in Other Visits  Medication Dose Route Frequency Provider Last Rate Last Dose  . heparin lock flush 100 unit/mL  500 Units Intracatheter Once PRN Brunetta Genera, MD      . sodium chloride 0.9 % injection 10 mL  10 mL Intracatheter PRN Brunetta Genera, MD        Family History  Problem Relation Age of Onset  . Diabetes Mother   . Heart attack Mother        Age 34  . Hypertension Mother   . Heart attack Father        Age 92  . Hyperlipidemia Father   . Diabetes Maternal Uncle   . Diabetes Maternal Grandmother     Review of Systems  Constitutional: Negative.   HENT: Positive for dental problem and sinus pain.        Gum disease  Eyes: Negative.   Respiratory: Negative.   Cardiovascular: Negative.   Gastrointestinal: Negative.   Endocrine: Negative.   Genitourinary:       Vulvar/vaginal lumps Vulvar/vaginal itching Loss of urine with cough or sneeze  Musculoskeletal: Positive for arthralgias and myalgias.  Skin: Negative.   Allergic/Immunologic: Negative.   Neurological: Negative.   Hematological: Negative.   Psychiatric/Behavioral: Negative.     Exam:   BP (!) 100/56 (BP Location: Right Arm, Patient Position: Sitting, Cuff Size: Large)   Pulse 74   Resp 14   Ht 5' 7.75" (1.721 m)   Wt 211 lb 6.4 oz (95.9 kg)   BMI 32.38 kg/m   Weight change: @WEIGHTCHANGE @ Height:   Height: 5' 7.75" (172.1 cm)  Ht Readings from Last 3 Encounters:  05/21/18 5' 7.75" (1.721 m)  01/09/18 5' 7.75" (1.721 m)  07/25/17 5' 7.75" (1.721 m)    General appearance: alert, cooperative and appears stated  age Head: Normocephalic, without obvious abnormality, atraumatic Neck: no adenopathy, supple, symmetrical, trachea midline and thyroid normal to inspection and palpation Lungs: clear to auscultation bilaterally Cardiovascular: regular rate and rhythm Breasts: normal appearance, no masses or tenderness Abdomen: soft, non-tender; non distended,  no masses,  no organomegaly Extremities: extremities normal, atraumatic, no cyanosis or edema Skin: Skin color, texture, turgor normal. No  rashes or lesions Lymph nodes: Cervical, supraclavicular, and axillary nodes normal. No abnormal inguinal nodes palpated Neurologic: Grossly normal   Pelvic: External genitalia:  no lesions, mild erythema              Urethra:  normal appearing urethra with no masses, tenderness or lesions              Bartholins and Skenes: normal                 Vagina: normal appearing vagina with normal color and discharge, no lesions              Cervix: no lesions, IUD string 2 cm               Bimanual Exam:  Uterus:  normal size, contour, position, consistency, mobility, non-tender              Adnexa: no mass, fullness, tenderness               Rectovaginal: Confirms               Anus:  normal sphincter tone, no lesions  Chaperone was present for exam.  A:  Well Woman with normal exam  Mirena IUD check, helping her cycle control  GSI, long term  C/O recurrent yeast vaginitis  P:   No pap this year  Mammogram  Colon cancer screening at 54  Labs with primary MD  Discussed breast self exam  Discussed calcium and vit D intake  Kegel information given  Affirm sent (recently self treated with diflucan)

## 2018-05-22 ENCOUNTER — Other Ambulatory Visit: Payer: Self-pay | Admitting: Obstetrics and Gynecology

## 2018-05-22 LAB — VAGINITIS/VAGINOSIS, DNA PROBE
CANDIDA SPECIES: POSITIVE — AB
GARDNERELLA VAGINALIS: NEGATIVE
TRICHOMONAS VAG: NEGATIVE

## 2018-05-22 MED ORDER — FLUCONAZOLE 150 MG PO TABS: ORAL_TABLET | ORAL | 0 refills | Status: DC

## 2018-07-07 NOTE — Progress Notes (Deleted)
Subjective:    Patient ID: Cynthia Moody, female    DOB: 16-Jun-1971, 47 y.o.   MRN: [id number]  HPI: 04/29/17 OV:  [name] presents to establish as a new pt.  She is a pleasant 47 year old female.  PMH:  T1D, HL, GERD, and chronic anemia.  She has an implantable insulin pump that will be changed out b/c current device is recently out of warranty.  She estimates using 60 units humalog insulin daily.  Recent addition of Jardiance 25mg  daily has helped normalize blodd sugar, today A1c is 8.2. She walks 3 miles a week (1 mile a day at lunch when at work) and that is only exercise she gets.  She reports her diet "is decent", however she still consumes sugar/CHO.  Only acute issues:  1) R Knee pain (2/10) that spontaneously developed last week, denies acute injury/trauma.  2) Active yeast infection She is married, one grown child and is employed at [employer].  01/09/18 OV: Ms. Cancel presents for CPE. She is compliant on all medication, denies SE. Her A1c today is 8.1 Her implantable pump administers 65-90 U daily of insulin lispro and also take jardiance 25mg  daily. If she skips dinner or does not eat enough she will routinely be awoken with a low BS- 50-70s, she estimates that this happens 4/7 mornings/week.  She has not seen her Endocrinologist since May- instructed to make appt ASAP. She tries to follow Diabetic diet and walks 20-27mins/three days a week. She has very poor dentition and has already had several teeth pulled and has a few "loose tooth now that hurt".  She has not seen her Dentist >6 months.  Several of her family members (to include her younger brother) have sig periodontal disease and now have dentures.  07/07/18 OV: [name] presents for regular f/u:   Patient Care Team    Relationship Specialty Notifications Start End  Esaw Grandchild, NP PCP - General Family Medicine  04/29/17     Patient Active Problem List   Diagnosis Date Noted  . Healthcare maintenance  01/09/2018  . Poor dentition 01/09/2018  . Bronchitis, acute 07/25/2017  . Leg wound, right 07/25/2017  . Sinusitis, acute maxillary 07/25/2017  . Ingrown toenail 06/03/2017  . Yeast infection 04/29/2017  . 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 Diabetes ([place]) 08/16/2015  . Dyslipidemia 08/16/2015  . GERD (gastroesophageal reflux disease) 08/16/2015  . Anemia, iron deficiency 08/16/2015     Past Medical History:  Diagnosis Date  . Acne   . Anemia   . Cough 01/27/2016  . Depression   . Diabetes mellitus type 1 ([place])   . Diabetic neuropathy (HCC)    feet  . Diabetic retinopathy ([place])    bilateral  . Dyslipidemia   . GERD (gastroesophageal reflux disease)   . Infertility, female   . Post-nasal drip 01/27/2016  . Shoulder pain, bilateral    wakes up frequently due to pain  . Stuffy nose 01/27/2016  . Trigger finger of right hand 01/2016   index finger     Past Surgical History:  Procedure Laterality Date  . CESAREAN SECTION  01/17/1995  . INTRAUTERINE DEVICE (IUD) INSERTION  06/2017   Mirena   . SHOULDER SURGERY Left 06/29/2013   exc. bone spur  . TRIGGER FINGER RELEASE Right 02/02/2016   Procedure: RIGHT INDEX RELEASE TRIGGER  ;  Surgeon: Leanora Cover, MD;  Location: [facility];  Service: Orthopedics;  Laterality: Right;  Family History  Problem Relation Age of Onset  . Diabetes Mother   . Heart attack Mother        Age 57  . Hypertension Mother   . Heart attack Father        Age 12  . Hyperlipidemia Father   . Diabetes Maternal Uncle   . Diabetes Maternal Grandmother      Social History   Substance and Sexual Activity  Drug Use No     Social History   Substance and Sexual Activity  Alcohol Use Yes   Comment: occasionally     Social History   Tobacco Use  Smoking Status Never Smoker  Smokeless Tobacco Never Used     Outpatient Encounter Medications as of 07/08/2018  Medication Sig Note  .  acetaminophen (TYLENOL) 650 MG CR tablet Take 650 mg by mouth as needed for pain.   [name] Kitchen aspirin 500 MG tablet Take 500 mg by mouth every 6 (six) hours as needed for pain.   [name] Kitchen b complex vitamins tablet Take 1 tablet by mouth daily.   . caffeine 200 MG TABS tablet Take 200 mg by mouth every 4 (four) hours as needed.   . cholecalciferol (VITAMIN D) 1000 UNITS tablet Take 1,000 Units by mouth daily.   . Continuous Blood Gluc Sensor (FREESTYLE LIBRE 14 DAY SENSOR) MISC USE 1 UNITS EVERY 14 DAYS. TO CHECK BLLOD SUGAR CHENGE THE UNIT EVERY 14 DAYS   . empagliflozin (JARDIANCE) 25 MG TABS tablet TAKE 1 TABLET BY MOUTH EVERY DAY   . Ferrous Sulfate Dried (SLOW RELEASE IRON) 45 MG TBCR Take by mouth.   . fexofenadine (ALLEGRA) 180 MG tablet Take 180 mg by mouth daily.   . fluconazole (DIFLUCAN) 150 MG tablet TAKE 1 TABLET ONCE WEEKLY AS NEEDED   . fluconazole (DIFLUCAN) 150 MG tablet Take one tablet every 72 hours x 3 doses   . fluticasone (FLONASE) 50 MCG/ACT nasal spray PLACE 1 SPRAY INTO BOTH NOSTRILS DAILY.   [name] Kitchen glucose blood (BAYER CONTOUR NEXT TEST) test strip Use to check blood sugar 4 times per day.   . ibuprofen (ADVIL) 200 MG tablet 1 tablet as needed 10/25/2015: Received from: [facility]  . Insulin Infusion Pump Supplies (AUTOSOFT 90 INFUSION SET) MISC CHANGE EVERY 2-3 DAYS   . Insulin Infusion Pump Supplies (PARADIGM QUICK-SET 32" 6MM) MISC 1 Device by Does not apply route every 3 (three) days.   . Insulin Infusion Pump Supplies (PARADIGM RESERVOIR 3ML) MISC 1 Device by Does not apply route every 3 (three) days.   . insulin lispro (HUMALOG) 100 UNIT/ML injection VIA INSULIN PUMP, total of 90 units/day   . levonorgestrel (MIRENA) 20 MCG/24HR IUD 1 each by Intrauterine route once.   [name] Kitchen omeprazole (PRILOSEC) 20 MG capsule Take 1 capsule (20 mg total) by mouth daily.   . Vitamin D, Ergocalciferol, (DRISDOL) 50000 units CAPS capsule Take 1 capsule (50,000 Units total) by mouth every 7  (seven) days.    Facility-Administered Encounter Medications as of 07/08/2018  Medication  . heparin lock flush 100 unit/mL  . sodium chloride 0.9 % injection 10 mL    Allergies: Lipitor [atorvastatin] and Simvastatin  There is no height or weight on file to calculate BMI.  There were no vitals taken for this visit.   Review of Systems  Constitutional: Negative for activity change, appetite change, chills, diaphoresis, fatigue, fever and unexpected weight change.  HENT: Negative for congestion.   Eyes: Negative for visual disturbance.  Respiratory:  Negative for cough, chest tightness, shortness of breath, wheezing and stridor.   Cardiovascular: Negative for chest pain, palpitations and leg swelling.  Gastrointestinal: Negative for abdominal distention, abdominal pain, blood in stool, constipation, diarrhea, nausea and vomiting.  Endocrine: Negative for cold intolerance, heat intolerance, polydipsia, polyphagia and polyuria.  Genitourinary: Negative for difficulty urinating, flank pain and hematuria.  Musculoskeletal: Positive for arthralgias. Negative for back pain, gait problem, joint swelling, myalgias, neck pain and neck stiffness.  Skin: Negative for color change, pallor, rash and wound.  Neurological: Negative for dizziness, tremors, weakness, light-headedness and headaches.       Objective:   Physical Exam  Constitutional: She is oriented to person, place, and time. She appears well-developed and well-nourished. No distress.  HENT:  Head: Normocephalic and atraumatic.  Right Ear: Hearing, external ear and ear canal normal. Tympanic membrane is not erythematous and not bulging. No decreased hearing is noted.  Left Ear: Hearing, tympanic membrane, external ear and ear canal normal. Tympanic membrane is not erythematous and not bulging. No decreased hearing is noted.  Nose: Nose normal. Right sinus exhibits no maxillary sinus tenderness and no frontal sinus tenderness. Left  sinus exhibits no maxillary sinus tenderness and no frontal sinus tenderness.  Mouth/Throat: Mucous membranes are normal. Abnormal dentition. Dental caries present. No oropharyngeal exudate, posterior oropharyngeal edema, posterior oropharyngeal erythema or tonsillar abscesses.  Very narrow right canal with cerumen build up.  Eyes: Pupils are equal, round, and reactive to light. Conjunctivae are normal.  Neck: Normal range of motion. Neck supple.  Cardiovascular: Normal rate, regular rhythm, normal heart sounds and intact distal pulses.  No murmur heard. Pulmonary/Chest: Effort normal and breath sounds normal. No respiratory distress. She has no wheezes. She has no rales. She exhibits no tenderness. Right breast exhibits no inverted nipple and no mass. Left breast exhibits no inverted nipple and no mass.  Abdominal: Soft. Bowel sounds are normal. She exhibits no distension and no mass. There is no tenderness. There is no rebound and no guarding.  Genitourinary:  Genitourinary Comments: PAP not due  Musculoskeletal: Normal range of motion. She exhibits no edema or tenderness.  Lymphadenopathy:    She has no cervical adenopathy.  Neurological: She is alert and oriented to person, place, and time. Coordination normal.  Skin: Skin is warm and dry. No rash noted. She is not diaphoretic. No erythema. No pallor.  Psychiatric: She has a normal mood and affect. Her behavior is normal. Judgment and thought content normal.  Nursing note and vitals reviewed.         Assessment & Plan:   No diagnosis found.  No problem-specific Assessment & Plan notes found for this encounter.    FOLLOW-UP:  No follow-ups on file.

## 2018-07-08 ENCOUNTER — Ambulatory Visit: Payer: 59 | Admitting: Adult Health

## 2018-07-28 ENCOUNTER — Encounter: Payer: Self-pay | Admitting: Adult Health

## 2018-07-28 ENCOUNTER — Ambulatory Visit: Payer: 59 | Admitting: Adult Health

## 2018-07-28 ENCOUNTER — Telehealth: Payer: Self-pay | Admitting: Adult Health

## 2018-07-28 VITALS — BP 124/81 | HR 91 | Ht 67.75 in | Wt 207.7 lb

## 2018-07-28 DIAGNOSIS — Z0000 Encounter for general adult medical examination without abnormal findings: Secondary | ICD-10-CM

## 2018-07-28 DIAGNOSIS — E109 Type 1 diabetes mellitus without complications: Secondary | ICD-10-CM

## 2018-07-28 DIAGNOSIS — E785 Hyperlipidemia, unspecified: Secondary | ICD-10-CM

## 2018-07-28 DIAGNOSIS — E559 Vitamin D deficiency, unspecified: Secondary | ICD-10-CM | POA: Diagnosis not present

## 2018-07-28 LAB — POCT UA - MICROALBUMIN
Albumin/Creatinine Ratio, Urine, POC: <30
CREATININE, POC: 100 mg/dL
Microalbumin Ur, POC: 10 mg/L

## 2018-07-28 LAB — POCT GLYCOSYLATED HEMOGLOBIN (HGB A1C): HEMOGLOBIN A1C: 8.5 % — AB (ref 4.0–5.6)

## 2018-07-28 MED ORDER — ROSUVASTATIN CALCIUM 5 MG PO TABS: 5.0000 mg | ORAL_TABLET | Freq: Every day | ORAL | 0 refills | Status: DC

## 2018-07-28 NOTE — Assessment & Plan Note (Signed)
The 10-year ASCVD risk score Mikey Bussing DC Brooke Bonito., et al., 2013) is: 1.4%   Values used to calculate the score:     Age: 47 years     Sex: Female     Is Non-Hispanic African American: No     Diabetic: Yes     Tobacco smoker: No     Systolic Blood Pressure: 160 mmHg     Is BP treated: No     HDL Cholesterol: 42 mg/dL     Total Cholesterol: 150 mg/dL  LDL- 96 Statin's have caused myalgia's in past, will try her on rosuvastatin (crestor) 5mg  Qnightly Increase water intake LFT recheck in 6 weeks Lipid recheck in 6 months

## 2018-07-28 NOTE — Assessment & Plan Note (Addendum)
Lab Results  Component Value Date   HGBA1C 8.5 (A) 07/28/2018   HGBA1C 8.0 (H) 01/10/2018   HGBA1C 8.1 01/09/2018   Denies episodes of hypoglycemia Currently on Jardiance 25mg  QD and 60-70 U novolog daily delivered per implanted infusion pump She is followed by endo/needs f/u app Microalbumin- normal

## 2018-07-28 NOTE — Telephone Encounter (Signed)
Please review and advise.  T. Nelson, CMA 

## 2018-07-28 NOTE — Patient Instructions (Signed)
Diabetes Mellitus and Nutrition When you have diabetes (diabetes mellitus), it is very important to have healthy eating habits because your blood sugar (glucose) levels are greatly affected by what you eat and drink. Eating healthy foods in the appropriate amounts, at about the same times every day, can help you:  Control your blood glucose.  Lower your risk of heart disease.  Improve your blood pressure.  Reach or maintain a healthy weight.  Every person with diabetes is different, and each person has different needs for a meal plan. Your health care provider may recommend that you work with a diet and nutrition specialist (dietitian) to make a meal plan that is best for you. Your meal plan may vary depending on factors such as:  The calories you need.  The medicines you take.  Your weight.  Your blood glucose, blood pressure, and cholesterol levels.  Your activity level.  Other health conditions you have, such as heart or kidney disease.  How do carbohydrates affect me? Carbohydrates affect your blood glucose level more than any other type of food. Eating carbohydrates naturally increases the amount of glucose in your blood. Carbohydrate counting is a method for keeping track of how many carbohydrates you eat. Counting carbohydrates is important to keep your blood glucose at a healthy level, especially if you use insulin or take certain oral diabetes medicines. It is important to know how many carbohydrates you can safely have in each meal. This is different for every person. Your dietitian can help you calculate how many carbohydrates you should have at each meal and for snack. Foods that contain carbohydrates include:  Bread, cereal, rice, pasta, and crackers.  Potatoes and corn.  Peas, beans, and lentils.  Milk and yogurt.  Fruit and juice.  Desserts, such as cakes, cookies, ice cream, and candy.  How does alcohol affect me? Alcohol can cause a sudden decrease in blood  glucose (hypoglycemia), especially if you use insulin or take certain oral diabetes medicines. Hypoglycemia can be a life-threatening condition. Symptoms of hypoglycemia (sleepiness, dizziness, and confusion) are similar to symptoms of having too much alcohol. If your health care provider says that alcohol is safe for you, follow these guidelines:  Limit alcohol intake to no more than 1 drink per day for nonpregnant women and 2 drinks per day for men. One drink equals 12 oz of beer, 5 oz of wine, or 1 oz of hard liquor.  Do not drink on an empty stomach.  Keep yourself hydrated with water, diet soda, or unsweetened iced tea.  Keep in mind that regular soda, juice, and other mixers may contain a lot of sugar and must be counted as carbohydrates.  What are tips for following this plan? Reading food labels  Start by checking the serving size on the label. The amount of calories, carbohydrates, fats, and other nutrients listed on the label are based on one serving of the food. Many foods contain more than one serving per package.  Check the total grams (g) of carbohydrates in one serving. You can calculate the number of servings of carbohydrates in one serving by dividing the total carbohydrates by 15. For example, if a food has 30 g of total carbohydrates, it would be equal to 2 servings of carbohydrates.  Check the number of grams (g) of saturated and trans fats in one serving. Choose foods that have low or no amount of these fats.  Check the number of milligrams (mg) of sodium in one serving. Most people   should limit total sodium intake to less than 2,300 mg per day.  Always check the nutrition information of foods labeled as "low-fat" or "nonfat". These foods may be higher in added sugar or refined carbohydrates and should be avoided.  Talk to your dietitian to identify your daily goals for nutrients listed on the label. Shopping  Avoid buying canned, premade, or processed foods. These  foods tend to be high in fat, sodium, and added sugar.  Shop around the outside edge of the grocery store. This includes fresh fruits and vegetables, bulk grains, fresh meats, and fresh dairy. Cooking  Use low-heat cooking methods, such as baking, instead of high-heat cooking methods like deep frying.  Cook using healthy oils, such as olive, canola, or sunflower oil.  Avoid cooking with butter, cream, or high-fat meats. Meal planning  Eat meals and snacks regularly, preferably at the same times every day. Avoid going long periods of time without eating.  Eat foods high in fiber, such as fresh fruits, vegetables, beans, and whole grains. Talk to your dietitian about how many servings of carbohydrates you can eat at each meal.  Eat 4-6 ounces of lean protein each day, such as lean meat, chicken, fish, eggs, or tofu. 1 ounce is equal to 1 ounce of meat, chicken, or fish, 1 egg, or 1/4 cup of tofu.  Eat some foods each day that contain healthy fats, such as avocado, nuts, seeds, and fish. Lifestyle   Check your blood glucose regularly.  Exercise at least 30 minutes 5 or more days each week, or as told by your health care provider.  Take medicines as told by your health care provider.  Do not use any products that contain nicotine or tobacco, such as cigarettes and e-cigarettes. If you need help quitting, ask your health care provider.  Work with a Social worker or diabetes educator to identify strategies to manage stress and any emotional and social challenges. What are some questions to ask my health care provider?  Do I need to meet with a diabetes educator?  Do I need to meet with a dietitian?  What number can I call if I have questions?  When are the best times to check my blood glucose? Where to find more information:  American Diabetes Association: diabetes.org/food-and-fitness/food  Academy of Nutrition and Dietetics:  PokerClues.dk  [employer] of Diabetes and Digestive and Kidney Diseases (NIH): ContactWire.be Summary  A healthy meal plan will help you control your blood glucose and maintain a healthy lifestyle.  Working with a diet and nutrition specialist (dietitian) can help you make a meal plan that is best for you.  Keep in mind that carbohydrates and alcohol have immediate effects on your blood glucose levels. It is important to count carbohydrates and to use alcohol carefully. This information is not intended to replace advice given to you by your health care provider. Make sure you discuss any questions you have with your health care provider. Document Released: 09/06/2005 Document Revised: 01/14/2017 Document Reviewed: 01/14/2017 Elsevier Interactive Patient Education  2018 [employer].   Insomnia Insomnia is a sleep disorder that makes it difficult to fall asleep or to stay asleep. Insomnia can cause tiredness (fatigue), low energy, difficulty concentrating, mood swings, and poor performance at work or school. There are three different ways to classify insomnia:  Difficulty falling asleep.  Difficulty staying asleep.  Waking up too early in the morning.  Any type of insomnia can be long-term (chronic) or short-term (acute). Both are common. Short-term  insomnia usually lasts for three months or less. Chronic insomnia occurs at least three times a week for longer than three months. What are the causes? Insomnia may be caused by another condition, situation, or substance, such as:  Anxiety.  Certain medicines.  Gastroesophageal reflux disease (GERD) or other gastrointestinal conditions.  Asthma or other breathing conditions.  Restless legs syndrome, sleep apnea, or other sleep disorders.  Chronic pain.  Menopause. This may include hot  flashes.  Stroke.  Abuse of alcohol, tobacco, or illegal drugs.  Depression.  Caffeine.  Neurological disorders, such as Alzheimer disease.  An overactive thyroid (hyperthyroidism).  The cause of insomnia may not be known. What increases the risk? Risk factors for insomnia include:  Gender. Women are more commonly affected than men.  Age. Insomnia is more common as you get older.  Stress. This may involve your professional or personal life.  Income. Insomnia is more common in people with lower income.  Lack of exercise.  Irregular work schedule or night shifts.  Traveling between different time zones.  What are the signs or symptoms? If you have insomnia, trouble falling asleep or trouble staying asleep is the main symptom. This may lead to other symptoms, such as:  Feeling fatigued.  Feeling nervous about going to sleep.  Not feeling rested in the morning.  Having trouble concentrating.  Feeling irritable, anxious, or depressed.  How is this treated? Treatment for insomnia depends on the cause. If your insomnia is caused by an underlying condition, treatment will focus on addressing the condition. Treatment may also include:  Medicines to help you sleep.  Counseling or therapy.  Lifestyle adjustments.  Follow these instructions at home:  Take medicines only as directed by your health care provider.  Keep regular sleeping and waking hours. Avoid naps.  Keep a sleep diary to help you and your health care provider figure out what could be causing your insomnia. Include: ? When you sleep. ? When you wake up during the night. ? How well you sleep. ? How rested you feel the next day. ? Any side effects of medicines you are taking. ? What you eat and drink.  Make your bedroom a comfortable place where it is easy to fall asleep: ? Put up shades or special blackout curtains to block light from outside. ? Use a white noise machine to block noise. ? Keep  the temperature cool.  Exercise regularly as directed by your health care provider. Avoid exercising right before bedtime.  Use relaxation techniques to manage stress. Ask your health care provider to suggest some techniques that may work well for you. These may include: ? Breathing exercises. ? Routines to release muscle tension. ? Visualizing peaceful scenes.  Cut back on alcohol, caffeinated beverages, and cigarettes, especially close to bedtime. These can disrupt your sleep.  Do not overeat or eat spicy foods right before bedtime. This can lead to digestive discomfort that can make it hard for you to sleep.  Limit screen use before bedtime. This includes: ? Watching TV. ? Using your smartphone, tablet, and computer.  Stick to a routine. This can help you fall asleep faster. Try to do a quiet activity, brush your teeth, and go to bed at the same time each night.  Get out of bed if you are still awake after 15 minutes of trying to sleep. Keep the lights down, but try reading or doing a quiet activity. When you feel sleepy, go back to bed.  Make sure that you  drive carefully. Avoid driving if you feel very sleepy.  Keep all follow-up appointments as directed by your health care provider. This is important. Contact a health care provider if:  You are tired throughout the day or have trouble in your daily routine due to sleepiness.  You continue to have sleep problems or your sleep problems get worse. Get help right away if:  You have serious thoughts about hurting yourself or someone else. This information is not intended to replace advice given to you by your health care provider. Make sure you discuss any questions you have with your health care provider. Document Released: 12/07/2000 Document Revised: 05/11/2016 Document Reviewed: 09/10/2014 Elsevier Interactive Patient Education  2018 [employer] all medications as directed. Start once nightly rosuvastatin  (crestor) 5mg . Increase water intake, strive for at least 75  ounces/day.   Follow Diabetic diet Increase regular exercise.  Recommend at least 30 minutes daily, 5 days per week of walking, jogging, biking, swimming, YouTube/Pinterest workout videos. Recommend OTC Melatonin 5mg  once nightly for insomnia. Do not drink/ingest any caffeine after 2pm. Stop taking caffeine pills, can cause cardiac complications. Return in 6 weeks for lab appt- check liver function. Will re-check lipids in 6 months. Please send MyChart message in 3 weeks to let me know how OTC Melatonin and reduced caffeine intake is helping with insomnia. Send MyChart message if blisters on feet become infected. Return for complete physical 6 months with fasting labs. NICE TO SEE YOU!

## 2018-07-28 NOTE — Telephone Encounter (Signed)
Good Evening Tonya, Can you please call Ms. Peterman and confirm with her reaction to previous statin therapy was myalgia's? Please ask if she has had any other reaction, ie : rash, hives, wheezing. Thanks! Valetta Fuller

## 2018-07-28 NOTE — Telephone Encounter (Signed)
Cynthia Moody @ [facility] in [place] states they maybe a problems of med allergies or drug interact with this Rx:   rosuvastatin (CRESTOR) 5 MG tablet [[id number]]   Order Details  Dose: 5 mg Route: Oral Frequency: Daily  Dispense Quantity: 90 tablet Refills: 0 Fills remaining: --        Sig: Take 1 tablet (5 mg total) by mouth daily.          ---per pharmacist the patient may have allergic reaction to the Crestor.  Please call them at: [id number] - [place], [place] [phone] (Phone) [phone] (Fax)    ----Forwarding message to medical assistant for review w/provider.  --glh

## 2018-07-28 NOTE — Progress Notes (Signed)
Subjective:    Patient ID: Cynthia Moody, female    DOB: [birth year], 47 y.o.   MRN: [id number]  HPI: 04/29/17 OV:  Cynthia Moody presents to establish as a new pt.  She is a pleasant 47 year old female.  PMH:  T1D, HL, GERD, and chronic anemia.  She has an implantable insulin pump that will be changed out b/c current device is recently out of warranty.  She estimates using 60 units humalog insulin daily.  Recent addition of Jardiance 25mg  daily has helped normalize blodd sugar, today A1c is 8.2. She walks 3 miles a week (1 mile a day at lunch when at work) and that is only exercise she gets.  She reports her diet "is decent", however she still consumes sugar/CHO.  Only acute issues:  1) R Knee pain (2/10) that spontaneously developed last week, denies acute injury/trauma.  2) Active yeast infection She is married, one grown child and is employed at [employer].  01/09/18 OV: Cynthia Moody presents for CPE. She is compliant on all medication, denies SE. Her A1c today is 8.1 Her implantable pump administers 65-90 U daily of insulin lispro and also take jardiance 25mg  daily. If she skips dinner or does not eat enough she will routinely be awoken with a low BS- 50-70s, she estimates that this happens 4/7 mornings/week.  She has not seen her Endocrinologist since May- instructed to make appt ASAP. She tries to follow Diabetic diet and walks 20-38mins/three days a week. She has very poor dentition and has already had several teeth pulled and has a few "loose tooth now that hurt".  She has not seen her Dentist >6 months.  Several of her family members (to include her younger brother) have sig periodontal disease and now have dentures.  07/28/2018 OV: Cynthia Moody is here for regular f/u: T1D, HLD, GERD, and chronic anemia  She estimates to drink >65 oz water/day and "hasn't bee eating all that well over the summer". She went off Jardiance 25mg  QD for about 3 weeks last month She has continuous blood  glucose monitoring system, levels running 90-220s, has only had "maybe one or two lows over the summer" She estimates 60-65 U daily of Novolog administered daily via implanted pump. She walks 1 mile 3 days/week She reports difficulty falling/remaining asleep. She reports taking "caffiene pill" each morning and each afternoon, around 1600 She reports increase in stress r/t to working "the CPAP program" at [employer] She continues to abstain from tobacco and seldom consumes ETOH She reports "two hot spots on my feet from walking in sandals last week while on vacation" She denies CP/dyspnea/dizziness/palpitations/fever/night sweats. Last Endocrinology appt 02/2018- she needs to schedule f/u Lab Results  Component Value Date   HGBA1C 8.5 (A) 07/28/2018   HGBA1C 8.0 (H) 01/10/2018   HGBA1C 8.1 01/09/2018   Patient Care Team    Relationship Specialty Notifications Start End  Esaw Grandchild, NP PCP - General Family Medicine  04/29/17     Patient Active Problem List   Diagnosis Date Noted  . Type 1 diabetes mellitus without complications ([place]) 99/24/2683  . Healthcare maintenance 01/09/2018  . Poor dentition 01/09/2018  . Bronchitis, acute 07/25/2017  . Leg wound, right 07/25/2017  . Sinusitis, acute maxillary 07/25/2017  . Ingrown toenail 06/03/2017  . Yeast infection 04/29/2017  . Diabetes mellitus with ophthalmic complication ([place]) 41/96/2229  . Diabetes ([place]) 08/16/2015  . Dyslipidemia 08/16/2015  . GERD (gastroesophageal reflux disease) 08/16/2015  .  Anemia, iron deficiency 08/16/2015     Past Medical History:  Diagnosis Date  . Acne   . Anemia   . Cough 01/27/2016  . Depression   . Diabetes mellitus type 1 ([place])   . Diabetic neuropathy ([facility])    feet  . Diabetic retinopathy ([place])    bilateral  . Dyslipidemia   . GERD (gastroesophageal reflux disease)   . Infertility, female   . Post-nasal drip 01/27/2016  . Shoulder pain, bilateral    wakes up frequently due to  pain  . Stuffy nose 01/27/2016  . Trigger finger of right hand 01/2016   index finger     Past Surgical History:  Procedure Laterality Date  . CESAREAN SECTION  01/17/1995  . INTRAUTERINE DEVICE (IUD) INSERTION  06/2017   Mirena   . SHOULDER SURGERY Left 06/29/2013   exc. bone spur  . TRIGGER FINGER RELEASE Right 02/02/2016   Procedure: RIGHT INDEX RELEASE TRIGGER  ;  Surgeon: Leanora Cover, MD;  Location: [facility];  Service: Orthopedics;  Laterality: Right;     Family History  Problem Relation Age of Onset  . Diabetes Mother   . Heart attack Mother        Age 67  . Hypertension Mother   . Heart attack Father        Age 8  . Hyperlipidemia Father   . Diabetes Maternal Uncle   . Diabetes Maternal Grandmother      Social History   Substance and Sexual Activity  Drug Use No     Social History   Substance and Sexual Activity  Alcohol Use Yes   Comment: occasionally     Social History   Tobacco Use  Smoking Status Never Smoker  Smokeless Tobacco Never Used     Outpatient Encounter Medications as of 07/28/2018  Medication Sig Note  . acetaminophen (TYLENOL) 650 MG CR tablet Take 650 mg by mouth as needed for pain.   [name] Kitchen aspirin 500 MG tablet Take 500 mg by mouth every 6 (six) hours as needed for pain.   [name] Kitchen b complex vitamins tablet Take 1 tablet by mouth daily.   . caffeine 200 MG TABS tablet Take 200 mg by mouth every 4 (four) hours as needed.   . cholecalciferol (VITAMIN D) 1000 UNITS tablet Take 1,000 Units by mouth daily.   . Continuous Blood Gluc Sensor (FREESTYLE LIBRE 14 DAY SENSOR) MISC USE 1 UNITS EVERY 14 DAYS. TO CHECK BLLOD SUGAR CHENGE THE UNIT EVERY 14 DAYS   . empagliflozin (JARDIANCE) 25 MG TABS tablet TAKE 1 TABLET BY MOUTH EVERY DAY   . Ferrous Sulfate Dried (SLOW RELEASE IRON) 45 MG TBCR Take by mouth.   . fexofenadine (ALLEGRA) 180 MG tablet Take 180 mg by mouth daily.   . fluconazole (DIFLUCAN) 150 MG tablet TAKE 1 TABLET ONCE  WEEKLY AS NEEDED   . fluticasone (FLONASE) 50 MCG/ACT nasal spray PLACE 1 SPRAY INTO BOTH NOSTRILS DAILY.   [name] Kitchen ibuprofen (ADVIL) 200 MG tablet 1 tablet as needed 10/25/2015: Received from: [facility]  . Insulin Infusion Pump Supplies (AUTOSOFT 90 INFUSION SET) MISC CHANGE EVERY 2-3 DAYS   . Insulin Infusion Pump Supplies (PARADIGM QUICK-SET 32" 6MM) MISC 1 Device by Does not apply route every 3 (three) days.   . Insulin Infusion Pump Supplies (PARADIGM RESERVOIR 3ML) MISC 1 Device by Does not apply route every 3 (three) days.   . insulin lispro (HUMALOG) 100 UNIT/ML injection VIA INSULIN PUMP, total  of 90 units/day   . levonorgestrel (MIRENA) 20 MCG/24HR IUD 1 each by Intrauterine route once.   [name] Kitchen omeprazole (PRILOSEC) 20 MG capsule Take 1 capsule (20 mg total) by mouth daily.   . rosuvastatin (CRESTOR) 5 MG tablet Take 1 tablet (5 mg total) by mouth daily.   . [DISCONTINUED] fluconazole (DIFLUCAN) 150 MG tablet Take one tablet every 72 hours x 3 doses   . [DISCONTINUED] glucose blood (BAYER CONTOUR NEXT TEST) test strip Use to check blood sugar 4 times per day.   . [DISCONTINUED] Vitamin D, Ergocalciferol, (DRISDOL) 50000 units CAPS capsule Take 1 capsule (50,000 Units total) by mouth every 7 (seven) days.    Facility-Administered Encounter Medications as of 07/28/2018  Medication  . heparin lock flush 100 unit/mL  . sodium chloride 0.9 % injection 10 mL    Allergies: Lipitor [atorvastatin] and Simvastatin  Body mass index is 31.81 kg/m.  Blood pressure 124/81, pulse 91, height 5' 7.75" (1.721 m), weight 207 lb 11.2 oz (94.2 kg), SpO2 98 %.   Review of Systems  Constitutional: Negative for activity change, appetite change, chills, diaphoresis, fatigue, fever and unexpected weight change.  HENT: Negative for congestion.   Eyes: Negative for visual disturbance.  Respiratory: Negative for cough, chest tightness, shortness of breath, wheezing and stridor.     Cardiovascular: Negative for chest pain, palpitations and leg swelling.  Gastrointestinal: Negative for abdominal distention, abdominal pain, blood in stool, constipation, diarrhea, nausea and vomiting.  Endocrine: Negative for cold intolerance, heat intolerance, polydipsia, polyphagia and polyuria.  Genitourinary: Negative for difficulty urinating, flank pain and hematuria.  Musculoskeletal: Positive for arthralgias. Negative for back pain, gait problem, joint swelling, myalgias, neck pain and neck stiffness.  Skin: Negative for color change, pallor, rash and wound.  Neurological: Negative for dizziness, tremors, weakness, light-headedness and headaches.       Objective:   Physical Exam  Constitutional: She is oriented to person, place, and time. She appears well-developed and well-nourished. No distress.  HENT:  Head: Normocephalic and atraumatic.  Right Ear: Hearing, external ear and ear canal normal. Tympanic membrane is not erythematous and not bulging. No decreased hearing is noted.  Left Ear: Hearing, tympanic membrane, external ear and ear canal normal. Tympanic membrane is not erythematous and not bulging. No decreased hearing is noted.  Nose: Nose normal. Right sinus exhibits no maxillary sinus tenderness and no frontal sinus tenderness. Left sinus exhibits no maxillary sinus tenderness and no frontal sinus tenderness.  Mouth/Throat: Mucous membranes are normal. Abnormal dentition. Dental caries present. No oropharyngeal exudate, posterior oropharyngeal edema, posterior oropharyngeal erythema or tonsillar abscesses.  Eyes: Pupils are equal, round, and reactive to light. Conjunctivae are normal.  Neck: Normal range of motion. Neck supple.  Cardiovascular: Normal rate, regular rhythm, normal heart sounds and intact distal pulses.  No murmur heard. Pulmonary/Chest: Effort normal and breath sounds normal. No respiratory distress. She has no wheezes. She has no rales. She exhibits no  tenderness. Right breast exhibits no inverted nipple and no mass. Left breast exhibits no inverted nipple and no mass.  Abdominal: Soft. Bowel sounds are normal. She exhibits no distension and no mass. There is no tenderness. There is no rebound and no guarding.  Genitourinary:  Genitourinary Comments: PAP not due  Musculoskeletal: Normal range of motion.  Lymphadenopathy:    She has no cervical adenopathy.  Neurological: She is alert and oriented to person, place, and time.  Skin: Skin is warm and dry. No rash noted. She is not  diaphoretic. No erythema. No pallor.     Vary shallow blister noted on medial aspect of each great toe No open tissue/drainage/excessive warmth/redness noted. No pain with palpation  Psychiatric: She has a normal mood and affect. Her behavior is normal. Judgment and thought content normal.  Nursing note and vitals reviewed.     Assessment & Plan:   1. Type 1 diabetes mellitus without complications ([place])   2. Vitamin D deficiency   3. Healthcare maintenance   4. Dyslipidemia     Healthcare maintenance Continue all medications as directed. Start once nightly rosuvastatin (crestor) 5mg . Increase water intake, strive for at least 75  ounces/day.   Follow Diabetic diet Increase regular exercise.  Recommend at least 30 minutes daily, 5 days per week of walking, jogging, biking, swimming, YouTube/Pinterest workout videos. Recommend OTC Melatonin 5mg  once nightly for insomnia. Do not drink/ingest any caffeine after 2pm. Stop taking caffeine pills, can cause cardiac complications. Return in 6 weeks for lab appt- check liver function. Will re-check lipids in 6 months. Please send MyChart message in 3 weeks to let me know how OTC Melatonin and reduced caffeine intake is helping with insomnia. Send MyChart message if blisters on feet become infected. Return for complete physical 6 months with fasting labs.  Dyslipidemia The 10-year ASCVD risk score Mikey Bussing DC Jr.,  et al., 2013) is: 1.4%   Values used to calculate the score:     Age: 50 years     Sex: Female     Is Non-Hispanic African American: No     Diabetic: Yes     Tobacco smoker: No     Systolic Blood Pressure: 332 mmHg     Is BP treated: No     HDL Cholesterol: 42 mg/dL     Total Cholesterol: 150 mg/dL  LDL- 96 Statin's have caused myalgia's in past, will try her on rosuvastatin (crestor) 5mg  Qnightly Increase water intake LFT recheck in 6 weeks Lipid recheck in 6 months  Type 1 diabetes mellitus without complications ([place]) Lab Results  Component Value Date   HGBA1C 8.5 (A) 07/28/2018   HGBA1C 8.0 (H) 01/10/2018   HGBA1C 8.1 01/09/2018   Denies episodes of hypoglycemia Currently on Jardiance 25mg  QD and 60-70 U novolog daily delivered per implanted infusion pump She is followed by endo/needs f/u app Microalbumin- normal     FOLLOW-UP:  Return in about 6 months (around 01/28/2019) for CPE, Fasting Labs.

## 2018-07-28 NOTE — Assessment & Plan Note (Signed)
Continue all medications as directed. Start once nightly rosuvastatin (crestor) 5mg . Increase water intake, strive for at least 75  ounces/day.   Follow Diabetic diet Increase regular exercise.  Recommend at least 30 minutes daily, 5 days per week of walking, jogging, biking, swimming, YouTube/Pinterest workout videos. Recommend OTC Melatonin 5mg  once nightly for insomnia. Do not drink/ingest any caffeine after 2pm. Stop taking caffeine pills, can cause cardiac complications. Return in 6 weeks for lab appt- check liver function. Will re-check lipids in 6 months. Please send MyChart message in 3 weeks to let me know how OTC Melatonin and reduced caffeine intake is helping with insomnia. Send MyChart message if blisters on feet become infected. Return for complete physical 6 months with fasting labs.

## 2018-07-29 ENCOUNTER — Encounter: Payer: Self-pay | Admitting: Adult Health

## 2018-07-29 LAB — VITAMIN D 25 HYDROXY (VIT D DEFICIENCY, FRACTURES): Vit D, 25-Hydroxy: 33.5 ng/mL (ref 30.0–100.0)

## 2018-07-29 NOTE — Telephone Encounter (Signed)
Spoke with pt who confirmed that her only previous reaction to statins was leg cramps.  Valetta Fuller discussed this issue with pt at her appt on 07/28/18 and pt agreed to try rosuvastatin.  Informed pharmacy that Valetta Fuller is aware of previous reaction to statins and that [facility] and the patient wish to proceed with filling this medication for the patient.  Charyl Bigger, CMA

## 2018-08-18 ENCOUNTER — Encounter: Payer: Self-pay | Admitting: Adult Health

## 2018-08-18 ENCOUNTER — Other Ambulatory Visit: Payer: Self-pay | Admitting: Adult Health

## 2018-09-12 ENCOUNTER — Other Ambulatory Visit: Payer: 59

## 2018-09-22 DIAGNOSIS — H5202 Hypermetropia, left eye: Secondary | ICD-10-CM | POA: Diagnosis not present

## 2018-09-26 DIAGNOSIS — Z9641 Presence of insulin pump (external) (internal): Secondary | ICD-10-CM | POA: Diagnosis not present

## 2018-09-26 DIAGNOSIS — E6609 Other obesity due to excess calories: Secondary | ICD-10-CM | POA: Diagnosis not present

## 2018-09-26 DIAGNOSIS — E785 Hyperlipidemia, unspecified: Secondary | ICD-10-CM | POA: Diagnosis not present

## 2018-09-26 DIAGNOSIS — Z6833 Body mass index (BMI) 33.0-33.9, adult: Secondary | ICD-10-CM | POA: Diagnosis not present

## 2018-09-26 DIAGNOSIS — E1042 Type 1 diabetes mellitus with diabetic polyneuropathy: Secondary | ICD-10-CM | POA: Diagnosis not present

## 2018-09-29 DIAGNOSIS — E1042 Type 1 diabetes mellitus with diabetic polyneuropathy: Secondary | ICD-10-CM | POA: Diagnosis not present

## 2018-10-09 ENCOUNTER — Encounter: Payer: Self-pay | Admitting: Adult Health

## 2018-10-09 ENCOUNTER — Ambulatory Visit: Payer: 59 | Admitting: Adult Health

## 2018-10-09 VITALS — BP 140/84 | HR 98 | Temp 98.8°F | Ht 67.75 in | Wt 206.4 lb

## 2018-10-09 DIAGNOSIS — R509 Fever, unspecified: Secondary | ICD-10-CM

## 2018-10-09 DIAGNOSIS — R05 Cough: Secondary | ICD-10-CM | POA: Insufficient documentation

## 2018-10-09 DIAGNOSIS — R059 Cough, unspecified: Secondary | ICD-10-CM | POA: Insufficient documentation

## 2018-10-09 DIAGNOSIS — M791 Myalgia, unspecified site: Secondary | ICD-10-CM

## 2018-10-09 LAB — POCT INFLUENZA A/B
INFLUENZA A, POC: POSITIVE — AB
INFLUENZA B, POC: NEGATIVE

## 2018-10-09 MED ORDER — ONDANSETRON 4 MG PO TBDP: 4.0000 mg | ORAL_TABLET | Freq: Three times a day (TID) | ORAL | 0 refills | Status: DC | PRN

## 2018-10-09 MED ORDER — HYDROCOD POLST-CPM POLST ER 10-8 MG/5ML PO SUER: 5.0000 mL | Freq: Two times a day (BID) | ORAL | 0 refills | Status: DC | PRN

## 2018-10-09 NOTE — Assessment & Plan Note (Signed)
Flu test Positive  Remain out of work, okay to return 10/14/18- excuse provided Tussionex and Zofran as needed. Follow BRAT diet Increase fluids/rest Continue OTC Acetaminophen as needed for fever/body aches Please call clinic with questions/concerns

## 2018-10-09 NOTE — Assessment & Plan Note (Signed)
[place] Controlled Substance Database reviewed- No aberrancies noted Tussionex as needed

## 2018-10-09 NOTE — Patient Instructions (Addendum)
Influenza, Adult Influenza ("the flu") is an infection in the lungs, nose, and throat (respiratory tract). It is caused by a virus. The flu causes many common cold symptoms, as well as a high fever and body aches. It can make you feel very sick. The flu spreads easily from person to person (is contagious). Getting a flu shot (influenza vaccination) every year is the best way to prevent the flu. Follow these instructions at home:  Take over-the-counter and prescription medicines only as told by your doctor.  Use a cool mist humidifier to add moisture (humidity) to the air in your home. This can make it easier to breathe.  Rest as needed.  Drink enough fluid to keep your pee (urine) clear or pale yellow.  Cover your mouth and nose when you cough or sneeze.  Wash your hands with soap and water often, especially after you cough or sneeze. If you cannot use soap and water, use hand sanitizer.  Stay home from work or school as told by your doctor. Unless you are visiting your doctor, try to avoid leaving home until your fever has been gone for 24 hours without the use of medicine.  Keep all follow-up visits as told by your doctor. This is important. How is this prevented?  Getting a yearly (annual) flu shot is the best way to avoid getting the flu. You may get the flu shot in late summer, fall, or winter. Ask your doctor when you should get your flu shot.  Wash your hands often or use hand sanitizer often.  Avoid contact with people who are sick during cold and flu season.  Eat healthy foods.  Drink plenty of fluids.  Get enough sleep.  Exercise regularly. Contact a doctor if:  You get new symptoms.  You have: ? Chest pain. ? Watery poop (diarrhea). ? A fever.  Your cough gets worse.  You start to have more mucus.  You feel sick to your stomach (nauseous).  You throw up (vomit). Get help right away if:  You start to be short of breath or have trouble breathing.  Your  skin or nails turn a bluish color.  You have very bad pain or stiffness in your neck.  You get a sudden headache.  You get sudden pain in your face or ear.  You cannot stop throwing up. This information is not intended to replace advice given to you by your health care provider. Make sure you discuss any questions you have with your health care provider. Document Released: 09/18/2008 Document Revised: 05/17/2016 Document Reviewed: 10/04/2015 Elsevier Interactive Patient Education  2017 [place].  Flu test Positive  Remain out of work, okay to return 10/14/18- excuse provided Tussionex and Zofran as needed. Follow BRAT diet Increase fluids/rest Continue OTC Acetaminophen as needed for fever/body aches Please call clinic with questions/concerns FEEL BETTER!

## 2018-10-09 NOTE — Progress Notes (Signed)
Subjective:    Patient ID: Cynthia Moody, female    DOB: [birth year], 47 y.o.   MRN: [id number]  HPI:  Ms. Braddy presents body aches, fever (highest 100.7 f), chills, night sweats, N/V/D (last loose stool 3 days ago, last emesis 3 days ago), frontal HA (7/10) and non- productive cough that started >4 days ago. Her husband was acutely ill last week with similar sx's She has been taking myriad of OTC remedies (Tylenol Flu/Cold, DayQuil,NyQuil), and been pushing fluids and rest. She reports BS have been steady running 150-160s, no lows Last dose of Acetaminophen was bedtime last night, temp now 98.8 f  Patient Care Team    Relationship Specialty Notifications Start End  Esaw Grandchild, NP PCP - General Family Medicine  04/29/17     Patient Active Problem List   Diagnosis Date Noted  . Fever 10/09/2018  . Cough 10/09/2018  . Type 1 diabetes mellitus without complications ([place]) 13/24/4010  . Healthcare maintenance 01/09/2018  . Poor dentition 01/09/2018  . Bronchitis, acute 07/25/2017  . Leg wound, right 07/25/2017  . Sinusitis, acute maxillary 07/25/2017  . Ingrown toenail 06/03/2017  . Yeast infection 04/29/2017  . Diabetes mellitus with ophthalmic complication ([place]) 27/25/3664  . Diabetes ([place]) 08/16/2015  . Dyslipidemia 08/16/2015  . GERD (gastroesophageal reflux disease) 08/16/2015  . Anemia, iron deficiency 08/16/2015     Past Medical History:  Diagnosis Date  . Acne   . Anemia   . Cough 01/27/2016  . Depression   . Diabetes mellitus type 1 ([place])   . Diabetic neuropathy (HCC)    feet  . Diabetic retinopathy ([place])    bilateral  . Dyslipidemia   . GERD (gastroesophageal reflux disease)   . Infertility, female   . Post-nasal drip 01/27/2016  . Shoulder pain, bilateral    wakes up frequently due to pain  . Stuffy nose 01/27/2016  . Trigger finger of right hand 01/2016   index finger     Past Surgical History:  Procedure Laterality Date  . CESAREAN SECTION  01/17/1995   . INTRAUTERINE DEVICE (IUD) INSERTION  06/2017   Mirena   . SHOULDER SURGERY Left 06/29/2013   exc. bone spur  . TRIGGER FINGER RELEASE Right 02/02/2016   Procedure: RIGHT INDEX RELEASE TRIGGER  ;  Surgeon: Leanora Cover, MD;  Location: [facility];  Service: Orthopedics;  Laterality: Right;     Family History  Problem Relation Age of Onset  . Diabetes Mother   . Heart attack Mother        Age 58  . Hypertension Mother   . Heart attack Father        Age 84  . Hyperlipidemia Father   . Diabetes Maternal Uncle   . Diabetes Maternal Grandmother      Social History   Substance and Sexual Activity  Drug Use No     Social History   Substance and Sexual Activity  Alcohol Use Yes   Comment: occasionally     Social History   Tobacco Use  Smoking Status Never Smoker  Smokeless Tobacco Never Used     Outpatient Encounter Medications as of 10/09/2018  Medication Sig Note  . acetaminophen (TYLENOL) 650 MG CR tablet Take 650 mg by mouth as needed for pain.   [name] Kitchen aspirin 500 MG tablet Take 500 mg by mouth every 6 (six) hours as needed for pain.   [name] Kitchen b complex vitamins tablet Take 1 tablet by mouth daily.   [name] Kitchen  caffeine 200 MG TABS tablet Take 200 mg by mouth every 4 (four) hours as needed.   . cholecalciferol (VITAMIN D) 1000 UNITS tablet Take 1,000 Units by mouth daily.   . Continuous Blood Gluc Sensor (FREESTYLE LIBRE 14 DAY SENSOR) MISC USE 1 UNITS EVERY 14 DAYS. TO CHECK BLLOD SUGAR CHENGE THE UNIT EVERY 14 DAYS   . empagliflozin (JARDIANCE) 25 MG TABS tablet TAKE 1 TABLET BY MOUTH EVERY DAY   . Ferrous Sulfate Dried (SLOW RELEASE IRON) 45 MG TBCR Take by mouth.   . fexofenadine (ALLEGRA) 180 MG tablet Take 180 mg by mouth daily.   . fluconazole (DIFLUCAN) 150 MG tablet TAKE 1 TABLET ONCE WEEKLY AS NEEDED   . fluticasone (FLONASE) 50 MCG/ACT nasal spray PLACE 1 SPRAY INTO BOTH NOSTRILS DAILY.   [name] Kitchen ibuprofen (ADVIL) 200 MG tablet 1 tablet as needed 10/25/2015:  Received from: [employer]  . Insulin Infusion Pump Supplies (AUTOSOFT 90 INFUSION SET) MISC CHANGE EVERY 2-3 DAYS   . Insulin Infusion Pump Supplies (PARADIGM QUICK-SET 32" 6MM) MISC 1 Device by Does not apply route every 3 (three) days.   . Insulin Infusion Pump Supplies (PARADIGM RESERVOIR 3ML) MISC 1 Device by Does not apply route every 3 (three) days.   . insulin lispro (HUMALOG) 100 UNIT/ML injection VIA INSULIN PUMP, total of 90 units/day   . levonorgestrel (MIRENA) 20 MCG/24HR IUD 1 each by Intrauterine route once.   [name] Kitchen omeprazole (PRILOSEC) 20 MG capsule Take 1 capsule (20 mg total) by mouth daily.   . chlorpheniramine-HYDROcodone (TUSSIONEX) 10-8 MG/5ML SUER Take 5 mLs by mouth every 12 (twelve) hours as needed.   . ondansetron (ZOFRAN-ODT) 4 MG disintegrating tablet Take 1 tablet (4 mg total) by mouth every 8 (eight) hours as needed for nausea or vomiting.    Facility-Administered Encounter Medications as of 10/09/2018  Medication  . heparin lock flush 100 unit/mL  . sodium chloride 0.9 % injection 10 mL    Allergies: Lipitor [atorvastatin] and Simvastatin  Body mass index is 31.62 kg/m.  Blood pressure 140/84, pulse 98, temperature 98.8 F (37.1 C), temperature source Oral, height 5' 7.75" (1.721 m), weight 206 lb 6.4 oz (93.6 kg), SpO2 97 %.  Review of Systems  Constitutional: Positive for activity change, appetite change, chills, diaphoresis, fatigue and fever. Negative for unexpected weight change.  HENT: Positive for congestion, postnasal drip, sinus pressure, sore throat and voice change. Negative for ear discharge, ear pain, sinus pain and trouble swallowing.   Eyes: Negative for visual disturbance.  Respiratory: Positive for cough. Negative for chest tightness, shortness of breath and wheezing.   Cardiovascular: Negative for chest pain and leg swelling.  Gastrointestinal: Positive for diarrhea, nausea and vomiting. Negative for abdominal distention,  abdominal pain and constipation.  Endocrine: Negative for cold intolerance, heat intolerance, polydipsia, polyphagia and polyuria.  Genitourinary: Positive for flank pain. Negative for difficulty urinating and dysuria.  Neurological: Positive for headaches. Negative for dizziness.  Hematological: Does not bruise/bleed easily.  Psychiatric/Behavioral: Positive for sleep disturbance.       Objective:   Physical Exam  Constitutional: She is oriented to person, place, and time. She appears well-developed and well-nourished. She has a sickly appearance. She appears ill.  HENT:  Head: Normocephalic and atraumatic.  Right Ear: External ear normal. Tympanic membrane is bulging. Tympanic membrane is not erythematous. No decreased hearing is noted.  Left Ear: External ear normal. Tympanic membrane is bulging. Tympanic membrane is not erythematous. No decreased hearing is noted.  Nose: Nose normal. No mucosal edema or rhinorrhea. Right sinus exhibits no maxillary sinus tenderness and no frontal sinus tenderness. Left sinus exhibits no maxillary sinus tenderness and no frontal sinus tenderness.  Mouth/Throat: Mucous membranes are normal. Abnormal dentition. Dental caries present. Posterior oropharyngeal edema and posterior oropharyngeal erythema present. No oropharyngeal exudate or tonsillar abscesses. Tonsils are 0 on the right. Tonsils are 0 on the left.  Eyes: Pupils are equal, round, and reactive to light. Conjunctivae and EOM are normal.  Neck: Normal range of motion. Neck supple.  Cardiovascular: Normal rate, regular rhythm, normal heart sounds and intact distal pulses.  No murmur heard. Pulmonary/Chest: Effort normal and breath sounds normal. No respiratory distress.  Abdominal: Soft. Bowel sounds are normal. She exhibits no distension and no mass. There is no tenderness. There is no guarding.  Lymphadenopathy:    She has no cervical adenopathy.  Neurological: She is alert and oriented to  person, place, and time.  Skin: Skin is warm. Capillary refill takes less than 2 seconds. No rash noted. She is diaphoretic. No erythema. No pallor.  Psychiatric: She has a normal mood and affect. Her behavior is normal. Judgment and thought content normal.  Nursing note and vitals reviewed.     Assessment & Plan:   1. Fever, unspecified fever cause   2. Myalgia   3. Cough     Fever Flu test Positive  Remain out of work, okay to return 10/14/18- excuse provided Tussionex and Zofran as needed. Follow BRAT diet Increase fluids/rest Continue OTC Acetaminophen as needed for fever/body aches Please call clinic with questions/concerns  [place] Controlled Substance Database reviewed- No aberrancies noted Tussionex as needed    FOLLOW-UP:  Return if symptoms worsen or fail to improve.

## 2018-10-20 ENCOUNTER — Other Ambulatory Visit: Payer: Self-pay | Admitting: Adult Health

## 2018-10-26 ENCOUNTER — Encounter (HOSPITAL_COMMUNITY): Payer: Self-pay | Admitting: Emergency Medicine

## 2018-10-26 ENCOUNTER — Other Ambulatory Visit: Payer: Self-pay

## 2018-10-26 ENCOUNTER — Observation Stay (HOSPITAL_COMMUNITY): Payer: 59

## 2018-10-26 ENCOUNTER — Inpatient Hospital Stay (HOSPITAL_COMMUNITY)
Admission: EM | Admit: 2018-10-26 | Discharge: 2018-10-28 | DRG: 638 | Disposition: A | Discharge status: Home / Self Care | Payer: 59 | Attending: Internal Medicine | Admitting: Internal Medicine

## 2018-10-26 ENCOUNTER — Emergency Department (HOSPITAL_COMMUNITY): Payer: 59

## 2018-10-26 DIAGNOSIS — R7989 Other specified abnormal findings of blood chemistry: Secondary | ICD-10-CM | POA: Diagnosis present

## 2018-10-26 DIAGNOSIS — R0682 Tachypnea, not elsewhere classified: Secondary | ICD-10-CM | POA: Diagnosis present

## 2018-10-26 DIAGNOSIS — N179 Acute kidney failure, unspecified: Secondary | ICD-10-CM | POA: Diagnosis not present

## 2018-10-26 DIAGNOSIS — E1040 Type 1 diabetes mellitus with diabetic neuropathy, unspecified: Secondary | ICD-10-CM | POA: Diagnosis present

## 2018-10-26 DIAGNOSIS — R197 Diarrhea, unspecified: Secondary | ICD-10-CM

## 2018-10-26 DIAGNOSIS — K219 Gastro-esophageal reflux disease without esophagitis: Secondary | ICD-10-CM | POA: Diagnosis present

## 2018-10-26 DIAGNOSIS — F329 Major depressive disorder, single episode, unspecified: Secondary | ICD-10-CM | POA: Diagnosis present

## 2018-10-26 DIAGNOSIS — E1110 Type 2 diabetes mellitus with ketoacidosis without coma: Secondary | ICD-10-CM | POA: Diagnosis present

## 2018-10-26 DIAGNOSIS — Z79899 Other long term (current) drug therapy: Secondary | ICD-10-CM

## 2018-10-26 DIAGNOSIS — R112 Nausea with vomiting, unspecified: Secondary | ICD-10-CM

## 2018-10-26 DIAGNOSIS — E109 Type 1 diabetes mellitus without complications: Secondary | ICD-10-CM

## 2018-10-26 DIAGNOSIS — E1010 Type 1 diabetes mellitus with ketoacidosis without coma: Secondary | ICD-10-CM | POA: Diagnosis not present

## 2018-10-26 DIAGNOSIS — D473 Essential (hemorrhagic) thrombocythemia: Secondary | ICD-10-CM

## 2018-10-26 DIAGNOSIS — E1069 Type 1 diabetes mellitus with other specified complication: Secondary | ICD-10-CM

## 2018-10-26 DIAGNOSIS — E876 Hypokalemia: Secondary | ICD-10-CM | POA: Diagnosis present

## 2018-10-26 DIAGNOSIS — E10319 Type 1 diabetes mellitus with unspecified diabetic retinopathy without macular edema: Secondary | ICD-10-CM | POA: Diagnosis present

## 2018-10-26 DIAGNOSIS — R17 Unspecified jaundice: Secondary | ICD-10-CM

## 2018-10-26 DIAGNOSIS — E785 Hyperlipidemia, unspecified: Secondary | ICD-10-CM | POA: Diagnosis present

## 2018-10-26 DIAGNOSIS — D75839 Thrombocytosis, unspecified: Secondary | ICD-10-CM

## 2018-10-26 LAB — CBC
HCT: 51 % — ABNORMAL HIGH (ref 36.0–46.0)
HCT: 50.8 % — ABNORMAL HIGH (ref 36.0–46.0)
HEMOGLOBIN: 14.6 g/dL (ref 12.0–15.0)
Hemoglobin: 15.5 g/dL — ABNORMAL HIGH (ref 12.0–15.0)
MCH: 28.5 pg (ref 26.0–34.0)
MCH: 28 pg (ref 26.0–34.0)
MCHC: 30.4 g/dL (ref 30.0–36.0)
MCHC: 28.7 g/dL — ABNORMAL LOW (ref 30.0–36.0)
MCV: 93.8 fL (ref 80.0–100.0)
MCV: 97.5 fL (ref 80.0–100.0)
Platelets: 671 10*3/uL — ABNORMAL HIGH (ref 150–400)
Platelets: 597 10*3/uL — ABNORMAL HIGH (ref 150–400)
RBC: 5.44 MIL/uL — ABNORMAL HIGH (ref 3.87–5.11)
RBC: 5.21 MIL/uL — ABNORMAL HIGH (ref 3.87–5.11)
RDW: 12.2 % (ref 11.5–15.5)
RDW: 12.2 % (ref 11.5–15.5)
WBC: 20.5 10*3/uL — AB (ref 4.0–10.5)
WBC: 28.2 10*3/uL — ABNORMAL HIGH (ref 4.0–10.5)
nRBC: 0 % (ref 0.0–0.2)
nRBC: 0 % (ref 0.0–0.2)

## 2018-10-26 LAB — I-STAT ARTERIAL BLOOD GAS, ED
Acid-base deficit: 23 mmol/L — ABNORMAL HIGH (ref 0.0–2.0)
BICARBONATE: 5 mmol/L — AB (ref 20.0–28.0)
O2 Saturation: 97 %
PCO2 ART: 16 mmHg — AB (ref 32.0–48.0)
TCO2: 5 mmol/L — AB (ref 22–32)
pH, Arterial: 7.102 — CL (ref 7.350–7.450)
pO2, Arterial: 115 mmHg — ABNORMAL HIGH (ref 83.0–108.0)

## 2018-10-26 LAB — CBG MONITORING, ED
GLUCOSE-CAPILLARY: 200 mg/dL — AB (ref 70–99)
GLUCOSE-CAPILLARY: 250 mg/dL — AB (ref 70–99)
GLUCOSE-CAPILLARY: 260 mg/dL — AB (ref 70–99)
GLUCOSE-CAPILLARY: 317 mg/dL — AB (ref 70–99)
Glucose-Capillary: 259 mg/dL — ABNORMAL HIGH (ref 70–99)
Glucose-Capillary: 182 mg/dL — ABNORMAL HIGH (ref 70–99)
Glucose-Capillary: 209 mg/dL — ABNORMAL HIGH (ref 70–99)
Glucose-Capillary: 180 mg/dL — ABNORMAL HIGH (ref 70–99)

## 2018-10-26 LAB — GLUCOSE, CAPILLARY
GLUCOSE-CAPILLARY: 144 mg/dL — AB (ref 70–99)
Glucose-Capillary: 135 mg/dL — ABNORMAL HIGH (ref 70–99)
Glucose-Capillary: 153 mg/dL — ABNORMAL HIGH (ref 70–99)
Glucose-Capillary: 117 mg/dL — ABNORMAL HIGH (ref 70–99)
Glucose-Capillary: 187 mg/dL — ABNORMAL HIGH (ref 70–99)
Glucose-Capillary: 164 mg/dL — ABNORMAL HIGH (ref 70–99)
Glucose-Capillary: 168 mg/dL — ABNORMAL HIGH (ref 70–99)

## 2018-10-26 LAB — BASIC METABOLIC PANEL
ANION GAP: 12 (ref 5–15)
ANION GAP: 11 (ref 5–15)
Anion gap: 19 — ABNORMAL HIGH (ref 5–15)
Anion gap: 14 (ref 5–15)
BUN: 24 mg/dL — ABNORMAL HIGH (ref 6–20)
BUN: 20 mg/dL (ref 6–20)
BUN: 19 mg/dL (ref 6–20)
BUN: 15 mg/dL (ref 6–20)
BUN: 14 mg/dL (ref 6–20)
CALCIUM: 8.8 mg/dL — AB (ref 8.9–10.3)
CALCIUM: 8.7 mg/dL — AB (ref 8.9–10.3)
CALCIUM: 7.8 mg/dL — AB (ref 8.9–10.3)
CO2: <7 mmol/L — ABNORMAL LOW (ref 22–32)
CO2: 8 mmol/L — ABNORMAL LOW (ref 22–32)
CO2: 13 mmol/L — ABNORMAL LOW (ref 22–32)
CO2: 17 mmol/L — ABNORMAL LOW (ref 22–32)
CO2: 18 mmol/L — ABNORMAL LOW (ref 22–32)
CREATININE: 1.56 mg/dL — AB (ref 0.44–1.00)
CREATININE: 1.61 mg/dL — AB (ref 0.44–1.00)
CREATININE: 1.52 mg/dL — AB (ref 0.44–1.00)
Calcium: 9.1 mg/dL (ref 8.9–10.3)
Calcium: 8.2 mg/dL — ABNORMAL LOW (ref 8.9–10.3)
Chloride: 106 mmol/L (ref 98–111)
Chloride: 109 mmol/L (ref 98–111)
Chloride: 109 mmol/L (ref 98–111)
Chloride: 106 mmol/L (ref 98–111)
Chloride: 105 mmol/L (ref 98–111)
Creatinine, Ser: 1.22 mg/dL — ABNORMAL HIGH (ref 0.44–1.00)
Creatinine, Ser: 1.26 mg/dL — ABNORMAL HIGH (ref 0.44–1.00)
GFR calc Af Amer: 47 mL/min — ABNORMAL LOW (ref >60)
GFR calc non Af Amer: 39 mL/min — ABNORMAL LOW (ref >60)
GFR, EST AFRICAN AMERICAN: 45 mL/min — AB (ref >60)
GFR, EST AFRICAN AMERICAN: 44 mL/min — AB (ref >60)
GFR, EST AFRICAN AMERICAN: 59 mL/min — AB (ref >60)
GFR, EST NON AFRICAN AMERICAN: 38 mL/min — AB (ref >60)
GFR, EST NON AFRICAN AMERICAN: 40 mL/min — AB (ref >60)
GFR, EST NON AFRICAN AMERICAN: 53 mL/min — AB (ref >60)
GFR, EST NON AFRICAN AMERICAN: 51 mL/min — AB (ref >60)
Glucose, Bld: 286 mg/dL — ABNORMAL HIGH (ref 70–99)
Glucose, Bld: 214 mg/dL — ABNORMAL HIGH (ref 70–99)
Glucose, Bld: 159 mg/dL — ABNORMAL HIGH (ref 70–99)
Glucose, Bld: 174 mg/dL — ABNORMAL HIGH (ref 70–99)
Glucose, Bld: 187 mg/dL — ABNORMAL HIGH (ref 70–99)
Potassium: 6.1 mmol/L — ABNORMAL HIGH (ref 3.5–5.1)
Potassium: 5 mmol/L (ref 3.5–5.1)
Potassium: 4.4 mmol/L (ref 3.5–5.1)
Potassium: 3.7 mmol/L (ref 3.5–5.1)
Potassium: 3.3 mmol/L — ABNORMAL LOW (ref 3.5–5.1)
SODIUM: 136 mmol/L (ref 135–145)
SODIUM: 136 mmol/L (ref 135–145)
SODIUM: 136 mmol/L (ref 135–145)
SODIUM: 134 mmol/L — AB (ref 135–145)
Sodium: 135 mmol/L (ref 135–145)

## 2018-10-26 LAB — COMPREHENSIVE METABOLIC PANEL
ALT: 14 U/L (ref 0–44)
AST: 20 U/L (ref 15–41)
Albumin: 4.4 g/dL (ref 3.5–5.0)
Alkaline Phosphatase: 92 U/L (ref 38–126)
Anion gap: 24 — ABNORMAL HIGH (ref 5–15)
BILIRUBIN TOTAL: 1.6 mg/dL — AB (ref 0.3–1.2)
BUN: 21 mg/dL — AB (ref 6–20)
CO2: 9 mmol/L — ABNORMAL LOW (ref 22–32)
Calcium: 9.5 mg/dL (ref 8.9–10.3)
Chloride: 100 mmol/L (ref 98–111)
Creatinine, Ser: 1.48 mg/dL — ABNORMAL HIGH (ref 0.44–1.00)
GFR calc Af Amer: 48 mL/min — ABNORMAL LOW (ref >60)
GFR, EST NON AFRICAN AMERICAN: 42 mL/min — AB (ref >60)
Glucose, Bld: 265 mg/dL — ABNORMAL HIGH (ref 70–99)
POTASSIUM: 4.8 mmol/L (ref 3.5–5.1)
Sodium: 133 mmol/L — ABNORMAL LOW (ref 135–145)
TOTAL PROTEIN: 8 g/dL (ref 6.5–8.1)

## 2018-10-26 LAB — DIFFERENTIAL
Abs Immature Granulocytes: 0.11 10*3/uL — ABNORMAL HIGH (ref 0.00–0.07)
BASOS ABS: 0.1 10*3/uL (ref 0.0–0.1)
BASOS PCT: 0 %
EOS ABS: 0 10*3/uL (ref 0.0–0.5)
EOS PCT: 0 %
Immature Granulocytes: 1 %
Lymphocytes Relative: 7 %
Lymphs Abs: 1.5 10*3/uL (ref 0.7–4.0)
MONO ABS: 0.5 10*3/uL (ref 0.1–1.0)
Monocytes Relative: 2 %
NEUTROS PCT: 90 %
Neutro Abs: 18.3 10*3/uL — ABNORMAL HIGH (ref 1.7–7.7)

## 2018-10-26 LAB — URINALYSIS, ROUTINE W REFLEX MICROSCOPIC
Bilirubin Urine: NEGATIVE
KETONES UR: 80 mg/dL — AB
Leukocytes, UA: NEGATIVE
Nitrite: NEGATIVE
PH: 5 (ref 5.0–8.0)
Protein, ur: NEGATIVE mg/dL
Specific Gravity, Urine: 1.026 (ref 1.005–1.030)

## 2018-10-26 LAB — I-STAT BETA HCG BLOOD, ED (MC, WL, AP ONLY): I-stat hCG, quantitative: <5 m[IU]/mL (ref <5)

## 2018-10-26 LAB — BLOOD GAS, ARTERIAL
Acid-base deficit: 6.8 mmol/L — ABNORMAL HIGH (ref 0.0–2.0)
Bicarbonate: 17.4 mmol/L — ABNORMAL LOW (ref 20.0–28.0)
Drawn by: 105521
FIO2: 0.21
O2 Saturation: 97.8 %
PATIENT TEMPERATURE: 98.6
pCO2 arterial: 30.3 mmHg — ABNORMAL LOW (ref 32.0–48.0)
pH, Arterial: 7.378 (ref 7.350–7.450)
pO2, Arterial: 94.3 mmHg (ref 83.0–108.0)

## 2018-10-26 LAB — HEMOGLOBIN A1C
Hgb A1c MFr Bld: 7.9 % — ABNORMAL HIGH (ref 4.8–5.6)
Mean Plasma Glucose: 180.03 mg/dL

## 2018-10-26 LAB — PROCALCITONIN: Procalcitonin: 0.9 ng/mL

## 2018-10-26 LAB — MRSA PCR SCREENING: MRSA by PCR: NEGATIVE

## 2018-10-26 LAB — HIV ANTIBODY (ROUTINE TESTING W REFLEX): HIV SCREEN 4TH GENERATION: NONREACTIVE

## 2018-10-26 LAB — MAGNESIUM: MAGNESIUM: 2.2 mg/dL (ref 1.7–2.4)

## 2018-10-26 LAB — LACTIC ACID, PLASMA
LACTIC ACID, VENOUS: 2.3 mmol/L — AB (ref 0.5–1.9)
LACTIC ACID, VENOUS: 1.2 mmol/L (ref 0.5–1.9)

## 2018-10-26 LAB — TROPONIN I

## 2018-10-26 LAB — LIPASE, BLOOD: Lipase: 21 U/L (ref 11–51)

## 2018-10-26 MED ORDER — DEXTROSE 50 % IV SOLN: 25.0000 mL | INTRAVENOUS | Status: DC | PRN

## 2018-10-26 MED ORDER — ONDANSETRON HCL 4 MG/2ML IJ SOLN
4.0000 mg | Freq: Four times a day (QID) | INTRAMUSCULAR | Status: DC | PRN
  Administered 2018-10-27: 4 mg via INTRAVENOUS
  Filled 2018-10-26: qty 2

## 2018-10-26 MED ORDER — SODIUM BICARBONATE 8.4 % IV SOLN
50.0000 meq | Freq: Once | INTRAVENOUS | Status: AC
  Administered 2018-10-26: 50 meq via INTRAVENOUS
  Filled 2018-10-26: qty 50

## 2018-10-26 MED ORDER — ACETAMINOPHEN 325 MG PO TABS
650.0000 mg | ORAL_TABLET | Freq: Four times a day (QID) | ORAL | Status: DC | PRN
  Administered 2018-10-26: 650 mg via ORAL
  Filled 2018-10-26: qty 2

## 2018-10-26 MED ORDER — PNEUMOCOCCAL VAC POLYVALENT 25 MCG/0.5ML IJ INJ: 0.5000 mL | INJECTION | INTRAMUSCULAR | Status: DC | PRN

## 2018-10-26 MED ORDER — LOPERAMIDE HCL 2 MG PO CAPS: 4.0000 mg | ORAL_CAPSULE | Freq: Once | ORAL | Status: DC

## 2018-10-26 MED ORDER — METOCLOPRAMIDE HCL 5 MG/ML IJ SOLN
10.0000 mg | Freq: Once | INTRAMUSCULAR | Status: AC
  Administered 2018-10-26: 10 mg via INTRAVENOUS
  Filled 2018-10-26: qty 2

## 2018-10-26 MED ORDER — INSULIN REGULAR BOLUS VIA INFUSION
0.0000 [IU] | Freq: Three times a day (TID) | INTRAVENOUS | Status: DC
  Filled 2018-10-26: qty 10

## 2018-10-26 MED ORDER — INSULIN REGULAR(HUMAN) IN NACL 100-0.9 UT/100ML-% IV SOLN
INTRAVENOUS | Status: DC
  Administered 2018-10-26: 2.6 [IU]/h via INTRAVENOUS
  Filled 2018-10-26 (×2): qty 100

## 2018-10-26 MED ORDER — DIPHENHYDRAMINE HCL 50 MG/ML IJ SOLN
25.0000 mg | Freq: Once | INTRAMUSCULAR | Status: AC
  Administered 2018-10-26: 25 mg via INTRAVENOUS
  Filled 2018-10-26: qty 1

## 2018-10-26 MED ORDER — STERILE WATER FOR INJECTION IV SOLN
INTRAVENOUS | Status: DC
  Administered 2018-10-26 (×2): via INTRAVENOUS
  Filled 2018-10-26 (×4): qty 850

## 2018-10-26 MED ORDER — DEXTROSE-NACL 5-0.45 % IV SOLN
INTRAVENOUS | Status: DC
  Administered 2018-10-26 – 2018-10-27 (×4): via INTRAVENOUS

## 2018-10-26 MED ORDER — LOPERAMIDE HCL 2 MG PO CAPS: 2.0000 mg | ORAL_CAPSULE | ORAL | Status: DC | PRN

## 2018-10-26 MED ORDER — ONDANSETRON HCL 4 MG/2ML IJ SOLN
4.0000 mg | Freq: Once | INTRAMUSCULAR | Status: AC
  Administered 2018-10-26: 4 mg via INTRAVENOUS

## 2018-10-26 MED ORDER — ENOXAPARIN SODIUM 40 MG/0.4ML ~~LOC~~ SOLN
40.0000 mg | SUBCUTANEOUS | Status: DC
  Administered 2018-10-27: 40 mg via SUBCUTANEOUS
  Filled 2018-10-26: qty 0.4

## 2018-10-26 MED ORDER — SODIUM CHLORIDE 0.9 % IV BOLUS
1000.0000 mL | Freq: Once | INTRAVENOUS | Status: AC
  Administered 2018-10-26: 1000 mL via INTRAVENOUS

## 2018-10-26 MED ORDER — SODIUM CHLORIDE 0.9 % IV SOLN
INTRAVENOUS | Status: DC
  Administered 2018-10-26: 04:00:00 via INTRAVENOUS

## 2018-10-26 NOTE — ED Notes (Signed)
Pt removed insulin pump prior to initiation of IV insulin drip, verified pump was removed and off by this RN.

## 2018-10-26 NOTE — ED Notes (Signed)
Paged Attending MD(Hall) for Respiratory Therapist (RT has ABG results).

## 2018-10-26 NOTE — ED Notes (Signed)
Pt CBG was 250, notified Annie(RN)

## 2018-10-26 NOTE — ED Triage Notes (Signed)
Pt endorses nausea, vomiting, and diarrhea since yesterday. Unable to tolerate any po intake.  Pt is a type I diabetic. Ill appearing and pale in triage.

## 2018-10-26 NOTE — H&P (Addendum)
History and Physical    LOETA HERST XFG:[id number] DOB: July 27, 1971 DOA: 10/26/2018  PCP: Mina Marble D, NP Patient coming from: Home  Chief Complaint: Vomiting, diarrhea  HPI: Cynthia Moody is a 47 y.o. female with medical history significant of depression, type 1 diabetes, hyperlipidemia, GERD presenting to the hospital with a 1 day history of chills, bilious vomiting, and loose stools.  Reports having multiple episodes of vomiting and loose stools since yesterday.  Denies having any chest pain, shortness of breath, or abdominal pain.  Patient has been unable to tolerate much p.o. intake.  States she had the flu last month and symptoms resolved about 2 weeks ago. States she has type 1 diabetes for which she has an insulin pump and takes [place].  ED Course: Afebrile, slightly tachycardic and tachypneic. Not hypotensive and satting well on room air.  White count 20.5.  Blood glucose 265, bicarb 9, and anion gap 24.  Creatinine 1.4, baseline 0.9-1.0. Lipase normal.  T bili 1.6, remainder of LFTs normal.  UA not suggestive of infection.  Chest x-ray showing no active disease.  Patient was started on insulin infusion and received 1 L normal saline bolus in the ED.  TRH paged to admit.  Review of Systems: As per HPI otherwise 10 point review of systems negative.  Past Medical History:  Diagnosis Date  . Acne   . Anemia   . Cough 01/27/2016  . Depression   . Diabetes mellitus type 1 ([place])   . Diabetic neuropathy (HCC)    feet  . Diabetic retinopathy ([place])    bilateral  . Dyslipidemia   . GERD (gastroesophageal reflux disease)   . Infertility, female   . Post-nasal drip 01/27/2016  . Shoulder pain, bilateral    wakes up frequently due to pain  . Stuffy nose 01/27/2016  . Trigger finger of right hand 01/2016   index finger    Past Surgical History:  Procedure Laterality Date  . CESAREAN SECTION  01/17/1995  . INTRAUTERINE DEVICE (IUD) INSERTION  06/2017   Mirena   . SHOULDER SURGERY  Left 06/29/2013   exc. bone spur  . TRIGGER FINGER RELEASE Right 02/02/2016   Procedure: RIGHT INDEX RELEASE TRIGGER  ;  Surgeon: Leanora Cover, MD;  Location: [facility];  Service: Orthopedics;  Laterality: Right;     reports that she has never smoked. She has never used smokeless tobacco. She reports that she drinks alcohol. She reports that she does not use drugs.  Allergies  Allergen Reactions  . Lipitor [Atorvastatin] Swelling    LEGS  . Simvastatin Other (See Comments)    Severe leg cramps    Family History  Problem Relation Age of Onset  . Diabetes Mother   . Heart attack Mother        Age 49  . Hypertension Mother   . Heart attack Father        Age 93  . Hyperlipidemia Father   . Diabetes Maternal Uncle   . Diabetes Maternal Grandmother     Prior to Admission medications   Medication Sig Start Date End Date Taking? Authorizing Provider  b complex vitamins tablet Take 1 tablet by mouth daily.   Yes [provider]  cholecalciferol (VITAMIN D) 1000 UNITS tablet Take 1,000 Units by mouth daily.   Yes [provider]  empagliflozin (JARDIANCE) 25 MG TABS tablet Take 25 mg by mouth daily.  04/24/18  Yes [provider]  Ferrous Sulfate Dried (SLOW  RELEASE IRON) 45 MG TBCR Take 45 mg by mouth daily.    Yes [provider]  fluticasone (FLONASE) 50 MCG/ACT nasal spray PLACE 1 SPRAY INTO BOTH NOSTRILS DAILY. 05/08/18  Yes Danford, Katy D, NP  Insulin Human (INSULIN PUMP) SOLN Inject into the skin continuous. Humalog   Yes [provider]  levonorgestrel (MIRENA) 20 MCG/24HR IUD 1 each by Intrauterine route once.   Yes [provider]  omeprazole (PRILOSEC) 20 MG capsule TAKE 1 CAPSULE BY MOUTH  DAILY Patient taking differently: Take 20 mg by mouth daily.  10/20/18  Yes Danford, Valetta Fuller D, NP  ondansetron (ZOFRAN-ODT) 4 MG disintegrating tablet Take 1 tablet (4 mg total) by mouth every 8 (eight) hours as needed for nausea  or vomiting. 10/09/18  Yes Danford, Valetta Fuller D, NP  chlorpheniramine-HYDROcodone (TUSSIONEX) 10-8 MG/5ML SUER Take 5 mLs by mouth every 12 (twelve) hours as needed. Patient not taking: Reported on 10/26/2018 10/09/18   Mina Marble D, NP  Continuous Blood Gluc Sensor (FREESTYLE LIBRE 14 DAY SENSOR) MISC USE 1 UNITS EVERY 14 DAYS. TO CHECK BLLOD SUGAR CHENGE THE UNIT EVERY 14 DAYS 05/17/18   [provider]  Insulin Infusion Pump Supplies (AUTOSOFT 90 INFUSION SET) MISC CHANGE EVERY 2-3 DAYS 04/23/18   [provider]  Insulin Infusion Pump Supplies (PARADIGM QUICK-SET 32" 6MM) MISC 1 Device by Does not apply route every 3 (three) days.    [provider]  Insulin Infusion Pump Supplies (PARADIGM RESERVOIR 3ML) MISC 1 Device by Does not apply route every 3 (three) days.    [provider]  insulin lispro (HUMALOG) 100 UNIT/ML injection VIA INSULIN PUMP, total of 90 units/day Patient not taking: Reported on 10/26/2018 03/27/16   Renato Shin, MD  omeprazole (PRILOSEC) 20 MG capsule Take 1 capsule (20 mg total) by mouth daily. Patient not taking: Reported on 10/26/2018 06/10/17   Mina Marble D, NP    Physical Exam: Vitals:   10/26/18 0039 10/26/18 0115 10/26/18 0145 10/26/18 0345  BP: (!) 141/126 (!) 147/82 140/73 (!) 153/77  Pulse: (!) 104 (!) 106 (!) 101 (!) 119  Resp: 18 20 19  (!) 25  Temp: 98.1 F (36.7 C)     TempSrc: Oral     SpO2: 99% 100% 100% 100%   Physical Exam  Constitutional: She is oriented to person, place, and time. She appears well-developed and well-nourished.  Shivering  HENT:  Dry mucous membranes  Eyes: Right eye exhibits no discharge. Left eye exhibits no discharge.  Neck: Neck supple. No tracheal deviation present.  Cardiovascular: Normal rate, regular rhythm and intact distal pulses.  Pulmonary/Chest: Effort normal. She has no wheezes. She has no rales.  Anterior lung fields clear to auscultation  Abdominal: Soft. Bowel sounds are  normal. She exhibits no distension. There is no tenderness. There is no guarding.  Musculoskeletal: She exhibits no edema.  Neurological: She is alert and oriented to person, place, and time.  Somnolent but following commands and answering questions appropriately  Skin: Skin is warm and dry. She is not diaphoretic.     Labs on Admission: I have personally reviewed following labs and imaging studies  CBC: Recent Labs  Lab 10/26/18 0113  WBC 20.5*  NEUTROABS 18.3*  HGB 15.5*  HCT 51.0*  MCV 93.8  PLT 564*   Basic Metabolic Panel: Recent Labs  Lab 10/26/18 0054 10/26/18 0305  NA 133*  --   K 4.8  --   CL 100  --   CO2  9*  --   GLUCOSE 265*  --   BUN 21*  --   CREATININE 1.48*  --   CALCIUM 9.5  --   MG  --  2.2   GFR: CrCl cannot be calculated (Unknown ideal weight.). Liver Function Tests: Recent Labs  Lab 10/26/18 0054  AST 20  ALT 14  ALKPHOS 92  BILITOT 1.6*  PROT 8.0  ALBUMIN 4.4   Recent Labs  Lab 10/26/18 0054  LIPASE 21   No results for input(s): AMMONIA in the last 168 hours. Coagulation Profile: No results for input(s): INR, PROTIME in the last 168 hours. Cardiac Enzymes: No results for input(s): CKTOTAL, CKMB, CKMBINDEX, TROPONINI in the last 168 hours. BNP (last 3 results) No results for input(s): PROBNP in the last 8760 hours. HbA1C: No results for input(s): HGBA1C in the last 72 hours. CBG: Recent Labs  Lab 10/26/18 0051  GLUCAP 250*   Lipid Profile: No results for input(s): CHOL, HDL, LDLCALC, TRIG, CHOLHDL, LDLDIRECT in the last 72 hours. Thyroid Function Tests: No results for input(s): TSH, T4TOTAL, FREET4, T3FREE, THYROIDAB in the last 72 hours. Anemia Panel: No results for input(s): VITAMINB12, FOLATE, FERRITIN, TIBC, IRON, RETICCTPCT in the last 72 hours. Urine analysis:    Component Value Date/Time   COLORURINE STRAW (A) 10/26/2018 0044   APPEARANCEUR CLEAR 10/26/2018 0044   LABSPEC 1.026 10/26/2018 0044   PHURINE 5.0  10/26/2018 0044   GLUCOSEU >=500 (A) 10/26/2018 0044   HGBUR MODERATE (A) 10/26/2018 0044   BILIRUBINUR NEGATIVE 10/26/2018 0044   KETONESUR 80 (A) 10/26/2018 0044   PROTEINUR NEGATIVE 10/26/2018 0044   NITRITE NEGATIVE 10/26/2018 0044   LEUKOCYTESUR NEGATIVE 10/26/2018 0044    Radiological Exams on Admission: Dg Chest Port 1 View  Result Date: 10/26/2018 CLINICAL DATA:  47 year old female with diabetic ketoacidosis. EXAM: PORTABLE CHEST 1 VIEW COMPARISON:  None. FINDINGS: The heart size and mediastinal contours are within normal limits. Both lungs are clear. The visualized skeletal structures are unremarkable. IMPRESSION: No active disease. Electronically Signed   By: Anner Crete M.D.   On: 10/26/2018 03:47    EKG: Independently reviewed.  Sinus tachycardia (heart rate 106).  Assessment/Plan Principal Problem:   DKA (diabetic ketoacidoses) (HCC) Active Problems:   Type 1 diabetes (HCC)   AKI (acute kidney injury) ([place])   Thrombocytosis (HCC)   Diabetic ketoacidosis, type 1 DM -Slightly tachycardic and tachypneic.  Patient has somnolent but following commands and answering questions appropriately. -Blood glucose 265, bicarb 9, and anion gap 24. VBG showing pH 7.0. Patient is on home insulin pump. Since blood glucose is not significantly elevated, DKA likely due to home SGLT2 inhibitor use. -White count 20.5, however, no signs of infection -afebrile, UA negative for infection, and chest x-ray negative for infection.  Patient has been having vomiting and loose stools.  However, abdominal exam benign.  T bili mildly elevated but remainder of LFTs normal.  Lipase normal. -Monitor in stepdown unit -Keep n.p.o. -Check urine ketones -Insulin drip per DKA protocol.  Home insulin pump is currently off.  -Received 1 L normal saline bolus in the ED.  Continue IV fluid resuscitation - NS@250cc /hr; D5-1/2 NS@150  cc/hr when CBG <250 -BMP every 4 hours -Initiate diet and subcutaneous  insulin when patient is able to eat, bicarb normal, and gap closes x2.  Continue insulin drip for 1 to 2 hours after initiation of subcutaneous insulin. -Discontinue empagliflozin  Elevated T bili T bili 1.6, remainder of LFTs normal.  Lipase normal.  Patient is not having abdominal pain and abdominal exam benign.   -RUQ [place]  -Loperamide as needed for loose stools  Acute kidney injury Creatinine 1.4, baseline 0.9-1.0. Likely prerenal due to dehydration in the setting of vomiting and loose stools. -IV fluid resuscitation -Continue to monitor BMP -Avoid nephrotoxic agents/contrast  Thrombocytosis Platelet count 671,000.  Likely due to hemoconcentration in the setting of dehydration as hemoglobin is elevated as well. -Repeat CBC in a.m.  DVT prophylaxis: Lovenox Family Communication: Husband at bedside updated. Disposition Plan: Anticipate discharge to home in 1 to 2 days. Consults called: None Admission status: Observation   Shela Leff MD [facility] Pager [phone]  If 7PM-7AM, please contact night-coverage [email or web address] Password [facility]  10/26/2018, 4:47 AM

## 2018-10-26 NOTE — Progress Notes (Signed)
LAMIYA NAAS is a 47 y.o. female with medical history significant of depression, type 1 diabetes, hyperlipidemia, GERD presenting to the hospital with a 1 day history of chills, bilious vomiting, and loose stools.  Reports having multiple episodes of vomiting and loose stools since yesterday.  Denies having any chest pain, shortness of breath, or abdominal pain.  Patient has been unable to tolerate much p.o. intake.  States she had the flu last month and symptoms resolved about 2 weeks ago. States she has type 1 diabetes for which she has an insulin pump and takes [place].  10/26/2018: Patient seen and examined at bedside.  DKA type I protocol in place.  Severe metabolic acidosis with pH of 7.1.  Started on isotonic bicarb with improvement of her acidosis.  Continue BMets every 4 hours until gap closes.  Repeat arterial ABG this afternoon to reassess acidosis.  Please refer to H&P dictated by Dr. Marlowe Sax on 10/26/2018 for further details of the assessment and plan.

## 2018-10-26 NOTE — Progress Notes (Signed)
MD paged X3 before return call back.  0942 - call back from Dr. Nevada Crane, reported critical ABG results. No new orders at this time

## 2018-10-26 NOTE — ED Provider Notes (Signed)
[facility] EMERGENCY DEPARTMENT Provider Note   CSN: [id number] Arrival date & time: 10/26/18  0033     History   Chief Complaint Chief Complaint  Patient presents with  . Emesis  . Diarrhea    HPI Cynthia Moody is a 47 y.o. female.  The history is provided by the patient.  She has history of diabetes, hyperlipidemia and comes in because of vomiting and diarrhea.  She started having diarrhea yesterday and started vomiting this morning.  She has been vomiting every 20 minutes throughout the day and has had numerous loose bowel movements.  She tried taking a dose of oral ondansetron and oral loperamide, but vomited after taking them.  She states her abdomen is sore with vomiting but no true abdominal pain.  She denies fever or chills or sweats.  She denies any sick contacts.  Past Medical History:  Diagnosis Date  . Acne   . Anemia   . Cough 01/27/2016  . Depression   . Diabetes mellitus type 1 ([place])   . Diabetic neuropathy (HCC)    feet  . Diabetic retinopathy ([place])    bilateral  . Dyslipidemia   . GERD (gastroesophageal reflux disease)   . Infertility, female   . Post-nasal drip 01/27/2016  . Shoulder pain, bilateral    wakes up frequently due to pain  . Stuffy nose 01/27/2016  . Trigger finger of right hand 01/2016   index finger    Patient Active Problem List   Diagnosis Date Noted  . Fever 10/09/2018  . Cough 10/09/2018  . Type 1 diabetes mellitus without complications ([place]) 73/22/0254  . Healthcare maintenance 01/09/2018  . Poor dentition 01/09/2018  . Bronchitis, acute 07/25/2017  . Leg wound, right 07/25/2017  . Sinusitis, acute maxillary 07/25/2017  . Ingrown toenail 06/03/2017  . Yeast infection 04/29/2017  . 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 Diabetes ([place]) 08/16/2015  . Dyslipidemia 08/16/2015  . GERD (gastroesophageal reflux disease) 08/16/2015  . Anemia, iron deficiency 08/16/2015    Past Surgical  History:  Procedure Laterality Date  . CESAREAN SECTION  01/17/1995  . INTRAUTERINE DEVICE (IUD) INSERTION  06/2017   Mirena   . SHOULDER SURGERY Left 06/29/2013   exc. bone spur  . TRIGGER FINGER RELEASE Right 02/02/2016   Procedure: RIGHT INDEX RELEASE TRIGGER  ;  Surgeon: Leanora Cover, MD;  Location: [facility];  Service: Orthopedics;  Laterality: Right;     OB History    Gravida  1   Para  1   Term  1   Preterm      AB      Living  1     SAB      TAB      Ectopic      Multiple      Live Births  1            Home Medications    Prior to Admission medications   Medication Sig Start Date End Date Taking? Authorizing Provider  b complex vitamins tablet Take 1 tablet by mouth daily.   Yes [provider]  cholecalciferol (VITAMIN D) 1000 UNITS tablet Take 1,000 Units by mouth daily.   Yes [provider]  empagliflozin (JARDIANCE) 25 MG TABS tablet Take 25 mg by mouth daily.  04/24/18  Yes [provider]  Ferrous Sulfate Dried (SLOW RELEASE IRON) 45 MG TBCR Take 45 mg by mouth daily.    Yes [provider]  fluticasone (FLONASE) 50 MCG/ACT nasal spray PLACE 1 SPRAY INTO BOTH NOSTRILS DAILY. 05/08/18  Yes Danford, Katy D, NP  Insulin Human (INSULIN PUMP) SOLN Inject into the skin continuous. Humalog   Yes [provider]  levonorgestrel (MIRENA) 20 MCG/24HR IUD 1 each by Intrauterine route once.   Yes [provider]  omeprazole (PRILOSEC) 20 MG capsule TAKE 1 CAPSULE BY MOUTH  DAILY Patient taking differently: Take 20 mg by mouth daily.  10/20/18  Yes Danford, Valetta Fuller D, NP  ondansetron (ZOFRAN-ODT) 4 MG disintegrating tablet Take 1 tablet (4 mg total) by mouth every 8 (eight) hours as needed for nausea or vomiting. 10/09/18  Yes Danford, Valetta Fuller D, NP  chlorpheniramine-HYDROcodone (TUSSIONEX) 10-8 MG/5ML SUER Take 5 mLs by mouth every 12 (twelve) hours as needed. Patient not taking: Reported on 10/26/2018  10/09/18   Mina Marble D, NP  Continuous Blood Gluc Sensor (FREESTYLE LIBRE 14 DAY SENSOR) MISC USE 1 UNITS EVERY 14 DAYS. TO CHECK BLLOD SUGAR CHENGE THE UNIT EVERY 14 DAYS 05/17/18   [provider]  Insulin Infusion Pump Supplies (AUTOSOFT 90 INFUSION SET) MISC CHANGE EVERY 2-3 DAYS 04/23/18   [provider]  Insulin Infusion Pump Supplies (PARADIGM QUICK-SET 32" 6MM) MISC 1 Device by Does not apply route every 3 (three) days.    [provider]  Insulin Infusion Pump Supplies (PARADIGM RESERVOIR 3ML) MISC 1 Device by Does not apply route every 3 (three) days.    [provider]  insulin lispro (HUMALOG) 100 UNIT/ML injection VIA INSULIN PUMP, total of 90 units/day Patient not taking: Reported on 10/26/2018 03/27/16   Renato Shin, MD  omeprazole (PRILOSEC) 20 MG capsule Take 1 capsule (20 mg total) by mouth daily. Patient not taking: Reported on 10/26/2018 06/10/17   Esaw Grandchild, NP    Family History Family History  Problem Relation Age of Onset  . Diabetes Mother   . Heart attack Mother        Age 68  . Hypertension Mother   . Heart attack Father        Age 70  . Hyperlipidemia Father   . Diabetes Maternal Uncle   . Diabetes Maternal Grandmother     Social History Social History   Tobacco Use  . Smoking status: Never Smoker  . Smokeless tobacco: Never Used  Substance Use Topics  . Alcohol use: Yes    Comment: occasionally  . Drug use: No     Allergies   Lipitor [atorvastatin] and Simvastatin   Review of Systems Review of Systems  All other systems reviewed and are negative.    Physical Exam Updated Vital Signs BP 140/73   Pulse (!) 101   Temp 98.1 F (36.7 C) (Oral)   Resp 19   SpO2 100%   Physical Exam  Nursing note and vitals reviewed.  47 year old female, resting comfortably and in no acute distress. Vital signs are significant for borderline elevated heart rate. Oxygen saturation is 100%, which is normal. Head  is normocephalic and atraumatic. PERRLA, EOMI. Oropharynx is clear. Neck is nontender and supple without adenopathy or JVD. Back is nontender and there is no CVA tenderness. Lungs are clear without rales, wheezes, or rhonchi. Chest is nontender. Heart has regular rate and rhythm without murmur. Abdomen is soft, flat, nontender without masses or hepatosplenomegaly and peristalsis is hypoactive. Extremities have no cyanosis or edema, full range of motion is present. Skin is warm and dry without rash. Neurologic: Mental  status is normal, cranial nerves are intact, there are no motor or sensory deficits.  ED Treatments / Results  Labs (all labs ordered are listed, but only abnormal results are displayed) Labs Reviewed  COMPREHENSIVE METABOLIC PANEL - Abnormal; Notable for the following components:      Result Value   Sodium 133 (*)    CO2 9 (*)    Glucose, Bld 265 (*)    BUN 21 (*)    Creatinine, Ser 1.48 (*)    Total Bilirubin 1.6 (*)    GFR calc non Af Amer 42 (*)    GFR calc Af Amer 48 (*)    Anion gap 24 (*)    All other components within normal limits  URINALYSIS, ROUTINE W REFLEX MICROSCOPIC - Abnormal; Notable for the following components:   Color, Urine STRAW (*)    Glucose, UA >=500 (*)    Hgb urine dipstick MODERATE (*)    Ketones, ur 80 (*)    Bacteria, UA RARE (*)    All other components within normal limits  DIFFERENTIAL - Abnormal; Notable for the following components:   Neutro Abs 18.3 (*)    Abs Immature Granulocytes 0.11 (*)    All other components within normal limits  CBC - Abnormal; Notable for the following components:   WBC 20.5 (*)    RBC 5.44 (*)    Hemoglobin 15.5 (*)    HCT 51.0 (*)    Platelets 671 (*)    All other components within normal limits  CBG MONITORING, ED - Abnormal; Notable for the following components:   Glucose-Capillary 250 (*)    All other components within normal limits  LIPASE, BLOOD  MAGNESIUM  CBC  TROPONIN I  I-STAT BETA  HCG BLOOD, ED (MC, WL, AP ONLY)  I-STAT VENOUS BLOOD GAS, ED    EKG EKG Interpretation  Date/Time:  Sunday October 26 2018 04:01:41 EST Ventricular Rate:  106 PR Interval:    QRS Duration: 97 QT Interval:  351 QTC Calculation: 467 R Axis:   60 Text Interpretation:  Sinus tachycardia Low voltage, precordial leads Abnormal R-wave progression, early transition When compared with ECG of 01/31/2016, No significant change was found Confirmed by Delora Fuel ([id number]) on 10/26/2018 4:05:38 AM   Radiology Dg Chest Port 1 View  Result Date: 10/26/2018 CLINICAL DATA:  47 year old female with diabetic ketoacidosis. EXAM: PORTABLE CHEST 1 VIEW COMPARISON:  None. FINDINGS: The heart size and mediastinal contours are within normal limits. Both lungs are clear. The visualized skeletal structures are unremarkable. IMPRESSION: No active disease. Electronically Signed   By: Anner Crete M.D.   On: 10/26/2018 03:47    Procedures Procedures  CRITICAL CARE Performed by: Delora Fuel Total critical care time: 85 minutes Critical care time was exclusive of separately billable procedures and treating other patients. Critical care was necessary to treat or prevent imminent or life-threatening deterioration. Critical care was time spent personally by me on the following activities: development of treatment plan with patient and/or surrogate as well as nursing, discussions with consultants, evaluation of patient's response to treatment, examination of patient, obtaining history from patient or surrogate, ordering and performing treatments and interventions, ordering and review of laboratory studies, ordering and review of radiographic studies, pulse oximetry and re-evaluation of patient's condition.  Medications Ordered in ED Medications  loperamide (IMODIUM) capsule 4 mg (has no administration in time range)  sodium chloride 0.9 % bolus 1,000 mL (1,000 mLs Intravenous New Bag/Given 10/26/18 0111)    ondansetron (  ZOFRAN) injection 4 mg (4 mg Intravenous Given 10/26/18 0111)     Initial Impression / Assessment and Plan / ED Course  I have reviewed the triage vital signs and the nursing notes.  Pertinent labs & imaging results that were available during my care of the patient were reviewed by me and considered in my medical decision making (see chart for details).  Nausea, vomiting, diarrhea and pattern suggestive of gastroenteritis.  She is given IV fluids, ondansetron, oral loperamide and screening labs obtained.  Labs show high anion gap metabolic acidosis strongly suggestive of diabetic ketoacidosis.  Glucose is only modestly elevated at 265.  There is evidence of acute kidney injury with creatinine 1.48 (was 0.97 on January 10, 2018).  It is noted that she is on empagliflozin, which can lead to ketoacidosis with lower glucose levels.  She is started on an insulin drip and will need to be admitted.  Chest x-ray is obtained and is unremarkable.  ECG is unremarkable.  Venous blood gas does show significant acidosis.  Case is discussed with Dr. Marlowe Sax of [facility], who agrees to admit the patient.  On review of patient's prior records, I see no prior hospitalizations for ketoacidosis.  Final Clinical Impressions(s) / ED Diagnoses   Final diagnoses:  Diabetic ketoacidosis without coma associated with type 1 diabetes mellitus ([place])  Nausea vomiting and diarrhea  Acute kidney injury (nontraumatic) [facility])    ED Discharge Orders    None       Delora Fuel, MD 07/68/08 [phone]

## 2018-10-27 DIAGNOSIS — F329 Major depressive disorder, single episode, unspecified: Secondary | ICD-10-CM | POA: Diagnosis present

## 2018-10-27 DIAGNOSIS — N179 Acute kidney failure, unspecified: Secondary | ICD-10-CM | POA: Diagnosis not present

## 2018-10-27 DIAGNOSIS — R112 Nausea with vomiting, unspecified: Secondary | ICD-10-CM | POA: Diagnosis not present

## 2018-10-27 DIAGNOSIS — Z79899 Other long term (current) drug therapy: Secondary | ICD-10-CM | POA: Diagnosis not present

## 2018-10-27 DIAGNOSIS — E1040 Type 1 diabetes mellitus with diabetic neuropathy, unspecified: Secondary | ICD-10-CM | POA: Diagnosis not present

## 2018-10-27 DIAGNOSIS — D473 Essential (hemorrhagic) thrombocythemia: Secondary | ICD-10-CM | POA: Diagnosis not present

## 2018-10-27 DIAGNOSIS — E1010 Type 1 diabetes mellitus with ketoacidosis without coma: Secondary | ICD-10-CM | POA: Diagnosis not present

## 2018-10-27 DIAGNOSIS — E785 Hyperlipidemia, unspecified: Secondary | ICD-10-CM | POA: Diagnosis not present

## 2018-10-27 DIAGNOSIS — R197 Diarrhea, unspecified: Secondary | ICD-10-CM | POA: Diagnosis not present

## 2018-10-27 DIAGNOSIS — R0682 Tachypnea, not elsewhere classified: Secondary | ICD-10-CM | POA: Diagnosis present

## 2018-10-27 DIAGNOSIS — E10319 Type 1 diabetes mellitus with unspecified diabetic retinopathy without macular edema: Secondary | ICD-10-CM | POA: Diagnosis not present

## 2018-10-27 DIAGNOSIS — R17 Unspecified jaundice: Secondary | ICD-10-CM

## 2018-10-27 DIAGNOSIS — K219 Gastro-esophageal reflux disease without esophagitis: Secondary | ICD-10-CM | POA: Diagnosis not present

## 2018-10-27 DIAGNOSIS — R69 Illness, unspecified: Secondary | ICD-10-CM | POA: Diagnosis not present

## 2018-10-27 DIAGNOSIS — R7989 Other specified abnormal findings of blood chemistry: Secondary | ICD-10-CM | POA: Diagnosis not present

## 2018-10-27 DIAGNOSIS — E876 Hypokalemia: Secondary | ICD-10-CM | POA: Diagnosis not present

## 2018-10-27 LAB — BASIC METABOLIC PANEL
ANION GAP: 15 (ref 5–15)
Anion gap: 9 (ref 5–15)
Anion gap: 16 — ABNORMAL HIGH (ref 5–15)
BUN: 11 mg/dL (ref 6–20)
BUN: 9 mg/dL (ref 6–20)
BUN: 11 mg/dL (ref 6–20)
CALCIUM: 7.7 mg/dL — AB (ref 8.9–10.3)
CHLORIDE: 105 mmol/L (ref 98–111)
CHLORIDE: 105 mmol/L (ref 98–111)
CO2: 17 mmol/L — ABNORMAL LOW (ref 22–32)
CO2: 20 mmol/L — AB (ref 22–32)
CO2: 15 mmol/L — ABNORMAL LOW (ref 22–32)
CREATININE: 1.12 mg/dL — AB (ref 0.44–1.00)
CREATININE: 1.13 mg/dL — AB (ref 0.44–1.00)
CREATININE: 1.18 mg/dL — AB (ref 0.44–1.00)
Calcium: 7.9 mg/dL — ABNORMAL LOW (ref 8.9–10.3)
Calcium: 8.3 mg/dL — ABNORMAL LOW (ref 8.9–10.3)
Chloride: 102 mmol/L (ref 98–111)
GFR calc non Af Amer: 58 mL/min — ABNORMAL LOW (ref >60)
GFR calc non Af Amer: 58 mL/min — ABNORMAL LOW (ref >60)
GFR, EST NON AFRICAN AMERICAN: 55 mL/min — AB (ref >60)
Glucose, Bld: 185 mg/dL — ABNORMAL HIGH (ref 70–99)
Glucose, Bld: 198 mg/dL — ABNORMAL HIGH (ref 70–99)
Glucose, Bld: 198 mg/dL — ABNORMAL HIGH (ref 70–99)
POTASSIUM: 4 mmol/L (ref 3.5–5.1)
Potassium: 3.1 mmol/L — ABNORMAL LOW (ref 3.5–5.1)
Potassium: 3.4 mmol/L — ABNORMAL LOW (ref 3.5–5.1)
SODIUM: 134 mmol/L — AB (ref 135–145)
SODIUM: 134 mmol/L — AB (ref 135–145)
SODIUM: 136 mmol/L (ref 135–145)

## 2018-10-27 LAB — POCT I-STAT 3, VENOUS BLOOD GAS (G3P V)
Acid-base deficit: 22 mmol/L — ABNORMAL HIGH (ref 0.0–2.0)
Bicarbonate: 7.1 mmol/L — ABNORMAL LOW (ref 20.0–28.0)
O2 Saturation: 49 %
PCO2 VEN: 26.4 mmHg — AB (ref 44.0–60.0)
PH VEN: 7.039 — AB (ref 7.250–7.430)
PO2 VEN: 37 mmHg (ref 32.0–45.0)
TCO2: 8 mmol/L — ABNORMAL LOW (ref 22–32)

## 2018-10-27 LAB — GLUCOSE, CAPILLARY
GLUCOSE-CAPILLARY: 191 mg/dL — AB (ref 70–99)
GLUCOSE-CAPILLARY: 191 mg/dL — AB (ref 70–99)
Glucose-Capillary: 120 mg/dL — ABNORMAL HIGH (ref 70–99)
Glucose-Capillary: 156 mg/dL — ABNORMAL HIGH (ref 70–99)
Glucose-Capillary: 166 mg/dL — ABNORMAL HIGH (ref 70–99)
Glucose-Capillary: 170 mg/dL — ABNORMAL HIGH (ref 70–99)
Glucose-Capillary: 174 mg/dL — ABNORMAL HIGH (ref 70–99)
Glucose-Capillary: 180 mg/dL — ABNORMAL HIGH (ref 70–99)
Glucose-Capillary: 186 mg/dL — ABNORMAL HIGH (ref 70–99)
Glucose-Capillary: 198 mg/dL — ABNORMAL HIGH (ref 70–99)

## 2018-10-27 MED ORDER — B COMPLEX PO TABS: 1.0000 | ORAL_TABLET | Freq: Every day | ORAL | Status: DC

## 2018-10-27 MED ORDER — INSULIN ASPART 100 UNIT/ML ~~LOC~~ SOLN: 0.0000 [IU] | Freq: Every day | SUBCUTANEOUS | Status: DC

## 2018-10-27 MED ORDER — INSULIN GLARGINE 100 UNIT/ML ~~LOC~~ SOLN
16.0000 [IU] | Freq: Once | SUBCUTANEOUS | Status: AC
  Administered 2018-10-27: 16 [IU] via SUBCUTANEOUS
  Filled 2018-10-27: qty 0.16

## 2018-10-27 MED ORDER — INSULIN PUMP
Freq: Three times a day (TID) | SUBCUTANEOUS | Status: DC
  Administered 2018-10-28: 10:00:00 via SUBCUTANEOUS
  Filled 2018-10-27: qty 1

## 2018-10-27 MED ORDER — INSULIN ASPART 100 UNIT/ML ~~LOC~~ SOLN
0.0000 [IU] | Freq: Three times a day (TID) | SUBCUTANEOUS | Status: DC
  Administered 2018-10-27: 3 [IU] via SUBCUTANEOUS

## 2018-10-27 MED ORDER — RENA-VITE PO TABS
1.0000 | ORAL_TABLET | Freq: Every day | ORAL | Status: DC
  Administered 2018-10-27: 1 via ORAL
  Filled 2018-10-27: qty 1

## 2018-10-27 MED ORDER — VITAMIN D 1000 UNITS PO TABS
1000.0000 [IU] | ORAL_TABLET | Freq: Every day | ORAL | Status: DC
  Administered 2018-10-27 – 2018-10-28 (×2): 1000 [IU] via ORAL
  Filled 2018-10-27 (×2): qty 1

## 2018-10-27 MED ORDER — POTASSIUM CHLORIDE CRYS ER 20 MEQ PO TBCR
40.0000 meq | EXTENDED_RELEASE_TABLET | Freq: Once | ORAL | Status: AC
  Administered 2018-10-27: 40 meq via ORAL
  Filled 2018-10-27: qty 2

## 2018-10-27 MED ORDER — ACETAMINOPHEN 325 MG PO TABS: 650.0000 mg | ORAL_TABLET | Freq: Four times a day (QID) | ORAL | Status: DC | PRN

## 2018-10-27 NOTE — Progress Notes (Signed)
Inpatient Diabetes Program Recommendations  AACE/ADA: New Consensus Statement on Inpatient Glycemic Control (2019)  Target Ranges:  Prepandial:   less than 140 mg/dL      Peak postprandial:   less than 180 mg/dL (1-2 hours)      Critically ill patients:  140 - 180 mg/dL   Results for Cynthia Moody, Cynthia Moody (MRN [id number]) as of 10/27/2018 10:34  Ref. Range 10/27/2018 00:45 10/27/2018 02:11 10/27/2018 04:11 10/27/2018 05:16 10/27/2018 06:13 10/27/2018 07:13 10/27/2018 08:10  Glucose-Capillary Latest Ref Range: 70 - 99 mg/dL 166 (H) 170 (H) 156 (H) 174 (H) 180 (H) 198 (H) 191 (H)  Results for Cynthia Moody, Cynthia Moody (MRN [id number]) as of 10/27/2018 10:34  Ref. Range 10/26/2018 00:54 10/26/2018 04:42  Glucose Latest Ref Range: 70 - 99 mg/dL 265 (H) 286 (H)  Results for Cynthia Moody, Cynthia Moody (MRN [id number]) as of 10/27/2018 10:34  Ref. Range 10/26/2018 07:33  Hemoglobin A1C Latest Ref Range: 4.8 - 5.6 % 7.9 (H)   Review of Glycemic Control  Diabetes history: DM1 Outpatient Diabetes medications: T-Slim insulin pump with Humalog, Jardiance 25 mg daily Current orders for Inpatient glycemic control: Lantus 16 units x 1 (given at 9:45am), Novolog 0-15 units TID with meals, Novolog 0-5 units QHS  Inpatient Diabetes Program Recommendations:  Insulin - Basal: Noted patient received one time Lantus 16 units today at 9:45 am.   Insulin-Pump:  Patient would like to resume her insulin pump prior to discharge. Patient is going to charge her insulin pump and was advised to call 1-800 number on back of her insulin pump to have them troubleshoot pump and ensure it is working properly. IF MD wants patient to resume her insulin pump, would recommend using SQ insulin until tomorrow morning and then allow her to restart her pump around 8 am on 10/28/18.  Correction (SSI): Please consider decreasing Novolog correction to 0-9 units TID with meals.  Insulin - Meal Coverage: Please consider ordering Novolog 5 units TID with meals for meal coverage  if patient eats at least 50% of meals.  Oral DM medication: Would recommend patient discontinue Jardiance at this time and follow up with Endocrinology.  NOTE: Spoke with patient and her husband regarding diabetes and home regimen for diabetes management.  Patient followed by [facility], [facility] at [facility] Endocr[facility]ology for diabetes management and was last seen on 09/26/18. Patient uses a T-slim insulin pump with Humalog insulin as an outpatient. Patient has uses [employer] which she currently has on the back of her left arm.  Patient has insulin pump in belongings bag and the battery is dead and needs to be charged up. Patient did not have charging cable for her insulin pump but her husband asked nursing staff if they had a charger that would work and they did so he is going to charge patient's insulin pump. Unable to verify insulin pump settings at this time.   Patient's husband has brought all other insulin pump supplies to the hospital so that patient can resume her insulin pump when appropriate. Asked patient to call [phone] number on her the back of her insulin pump and have them troubleshoot insulin pump to ensure it is working properly. Patient has already removed old insulin pump infusion site.  Per chart note on 09/29/18 by Randie Heinz, RD, CDE (appointment for CGM/Pump Download and Adjustments), current insulin pump settings should be as follows:  Basal insulin  12A 0.8 units/hour 3A 0.9 units/hour 7A 0.8 units/hour 11A 1.0 units/hour 12:30P 1.2 units/hour 7P  1.1 units/hour Total daily basal insulin: 24 units/24 hours  Carb Coverage 12A  1:5 1 unit for every 5 grams of carbohydrates 12:30P  1:4 1 unit for every 4 grams of carbohydrates  Insulin Sensitivity 1:40 1 unit drops blood glucose 40 mg/dl  Target Glucose Goals 12A 120 mg/dl 7P 110 mg/dl  Patient reports that she has been on Jardiance for about 1 year and she notes that she consistently has had yeast infections since  starting the Jardiance. Noted in chart, PA suggested decreasing dose of Jardiance by half at last visit. However, patient states that she has not changed dose at all.  Patient reports that she got a flu shot and shortly thereafter she actually got the flu (patient reports flu confirmed by MD). Since then she has not been eating much and drinking a lot of water. Patient states that she felt she was in DKA due to symptoms. Discussed DKA pathophysiology and risk of DKA with taking Jardiance especially in setting of poor PO intake.  Informed patient it would be recommended that she discontinue Jardaince for now until she is able to follow up with Endocrinology to discuss further.  Patient would like to resume her insulin pump when appropriate. Informed patient she received Lantus 16 units today at 9:45 am which will be active for around 24 hours. Discussed Novolog correction scale ordered. Informed patient it would be recommended that she be ordered meal coverage and decrease correction scale. Patient agreeable to using SQ insulin and states that she is suppose to go home tomorrow. Explained Lantus duration and advised patient that she will not want to resume her basal insulin until 24 hours after the Lantus was given today since it will still be active. Patient verbalized understanding of information discussed and states that she does not have any further questions related to diabetes at this time. Patient's husband has her insulin pump charging now.   Thanks, Barnie Alderman, RN, MSN, CDE Diabetes Coordinator Inpatient Diabetes Program [phone] (Team Pager from 8am to 5pm)

## 2018-10-27 NOTE — Progress Notes (Signed)
[place] TEAM 1 - Stepdown/ICU TEAM  Cynthia Moody  TSV:[id number] DOB: Aug 07, 1971 DOA: 10/26/2018 PCP: Esaw Grandchild, NP    Brief Narrative:  47 y.o. female with a hx of depression, DM1, hyperlipidemia, and GERD who presented with a 1 day history of chills, bilious vomiting, and loose stools.    In the ED she was found to have a blood glucose 265, bicarb 9, and anion gap 24.   Subjective: Feels much better this morning. No more N/V/D. Tolerating clear liquids w/o difficulty. No cp, sob, abdom pain. Is well versed on DM management and use of her insulin pump.   Assessment & Plan:  DKA in DM1 Pt off insulin pump since admit - gap now closed and bicarb 20 - stop IV bicarb - transition to lantus and SSI for now until DM Coordinator can investigate for possible pump malfunction v/s user error v/s other - suspect Jardiance is to blame, given relatively low CBG in setting of DKA   Acute kidney injury Pre-renal in setting of DKA - slowly improving - follow - could also be related to use of Jardiance   Hypokalemia Due to DKA  - replace and follow   Thrombocytosis  likely simply hemoconcentration due to severe DH of DKA - f/u in AM  DVT prophylaxis: lovenox  Code Status: FULL CODE Family Communication: spoke w/ husband at bedside  Disposition Plan: safe for transition to tele bed  Consultants:  none  Antimicrobials:  none   Objective: Blood pressure 120/60, pulse 92, temperature 98.6 F (37 C), temperature source Oral, resp. rate 19, height 5\' 6"  (1.676 m), weight 90.3 kg, SpO2 99 %.  Intake/Output Summary (Last 24 hours) at 10/27/2018 0834 Last data filed at 10/27/2018 0053 Gross per 24 hour  Intake 2589.56 ml  Output 1150 ml  Net 1439.56 ml   Filed Weights   10/26/18 1132  Weight: 90.3 kg    Examination: General: No acute respiratory distress Lungs: Clear to auscultation bilaterally without wheezes or crackles Cardiovascular: Regular rate and rhythm without murmur  gallop or rub normal S1 and S2 Abdomen: Nontender, nondistended, soft, bowel sounds positive, no rebound, no ascites, no appreciable mass Extremities: No significant cyanosis, clubbing, or edema bilateral lower extremities  CBC: Recent Labs  Lab 10/26/18 0113 10/26/18 0836  WBC 20.5* 28.2*  NEUTROABS 18.3*  --   HGB 15.5* 14.6  HCT 51.0* 50.8*  MCV 93.8 97.5  PLT 671* 923*   Basic Metabolic Panel: Recent Labs  Lab 10/26/18 0305  10/26/18 2147 10/27/18 0219 10/27/18 0625  NA  --    < > 134* 134* 134*  K  --    < > 3.3* 3.1* 3.4*  CL  --    < > 105 102 105  CO2  --    < > 18* 17* 20*  GLUCOSE  --    < > 187* 185* 198*  BUN  --    < > 14 11 9   CREATININE  --    < > 1.26* 1.12* 1.13*  CALCIUM  --    < > 7.8* 7.7* 7.9*  MG 2.2  --   --   --   --    < > = values in this interval not displayed.   GFR: Estimated Creatinine Clearance: 71.2 mL/min (A) (by C-G formula based on SCr of 1.13 mg/dL (H)).  Liver Function Tests: Recent Labs  Lab 10/26/18 0054  AST 20  ALT 14  ALKPHOS 92  BILITOT 1.6*  PROT 8.0  ALBUMIN 4.4   Recent Labs  Lab 10/26/18 0054  LIPASE 21    Cardiac Enzymes: Recent Labs  Lab 10/26/18 0335  TROPONINI <0.03    HbA1C: Hemoglobin A1C  Date/Time Value Ref Range Status  07/28/2018 01:51 PM 8.5 (A) 4.0 - 5.6 % Final  04/08/2015 9.5  Final   Hgb A1c MFr Bld  Date/Time Value Ref Range Status  10/26/2018 07:33 AM 7.9 (H) 4.8 - 5.6 % Final    Comment:    (NOTE) Pre diabetes:          5.7%-6.4% Diabetes:              >6.4% Glycemic control for   <7.0% adults with diabetes   01/10/2018 08:30 AM 8.0 (H) 4.8 - 5.6 % Final    Comment:             Prediabetes: 5.7 - 6.4          Diabetes: >6.4          Glycemic control for adults with diabetes: <7.0     CBG: Recent Labs  Lab 10/27/18 0411 10/27/18 0516 10/27/18 0613 10/27/18 0713 10/27/18 0810  GLUCAP 156* 174* 180* 198* 191*    Recent Results (from the past 240 hour(s))  MRSA  PCR Screening     Status: None   Collection Time: 10/26/18  2:03 PM  Result Value Ref Range Status   MRSA by PCR NEGATIVE NEGATIVE Final    Comment:        The GeneXpert MRSA Assay (FDA approved for NASAL specimens only), is one component of a comprehensive MRSA colonization surveillance program. It is not intended to diagnose MRSA infection nor to guide or monitor treatment for MRSA infections. Performed at [facility], [place] [address] [place],  [address]      Scheduled Meds: . enoxaparin (LOVENOX) injection  40 mg Subcutaneous Q24H  . insulin regular  0-10 Units Intravenous TID WC     LOS: 0 days   Cherene Altes, MD [facility] Office  [phone] Pager - Text Page per Amion  If 7PM-7AM, please contact night-coverage per Amion 10/27/2018, 8:34 AM

## 2018-10-28 DIAGNOSIS — R197 Diarrhea, unspecified: Secondary | ICD-10-CM

## 2018-10-28 DIAGNOSIS — N179 Acute kidney failure, unspecified: Secondary | ICD-10-CM

## 2018-10-28 DIAGNOSIS — E1010 Type 1 diabetes mellitus with ketoacidosis without coma: Principal | ICD-10-CM

## 2018-10-28 DIAGNOSIS — R112 Nausea with vomiting, unspecified: Secondary | ICD-10-CM

## 2018-10-28 DIAGNOSIS — D473 Essential (hemorrhagic) thrombocythemia: Secondary | ICD-10-CM

## 2018-10-28 LAB — CBC
HCT: 37.8 % (ref 36.0–46.0)
HEMOGLOBIN: 12.1 g/dL (ref 12.0–15.0)
MCH: 28.7 pg (ref 26.0–34.0)
MCHC: 32 g/dL (ref 30.0–36.0)
MCV: 89.6 fL (ref 80.0–100.0)
Platelets: 380 10*3/uL (ref 150–400)
RBC: 4.22 MIL/uL (ref 3.87–5.11)
RDW: 12.7 % (ref 11.5–15.5)
WBC: 8.7 10*3/uL (ref 4.0–10.5)
nRBC: 0 % (ref 0.0–0.2)

## 2018-10-28 LAB — COMPREHENSIVE METABOLIC PANEL
ALK PHOS: 60 U/L (ref 38–126)
ALT: 12 U/L (ref 0–44)
AST: 15 U/L (ref 15–41)
Albumin: 3.1 g/dL — ABNORMAL LOW (ref 3.5–5.0)
Anion gap: 11 (ref 5–15)
BUN: 7 mg/dL (ref 6–20)
CALCIUM: 8.6 mg/dL — AB (ref 8.9–10.3)
CO2: 19 mmol/L — ABNORMAL LOW (ref 22–32)
Chloride: 106 mmol/L (ref 98–111)
Creatinine, Ser: 1.05 mg/dL — ABNORMAL HIGH (ref 0.44–1.00)
GFR calc non Af Amer: >60 mL/min (ref >60)
GLUCOSE: 119 mg/dL — AB (ref 70–99)
POTASSIUM: 3.5 mmol/L (ref 3.5–5.1)
SODIUM: 136 mmol/L (ref 135–145)
Total Bilirubin: 1.1 mg/dL (ref 0.3–1.2)
Total Protein: 5.7 g/dL — ABNORMAL LOW (ref 6.5–8.1)

## 2018-10-28 LAB — GLUCOSE, CAPILLARY: GLUCOSE-CAPILLARY: 94 mg/dL (ref 70–99)

## 2018-10-28 MED ORDER — INSULIN PUMP
Freq: Three times a day (TID) | SUBCUTANEOUS | Status: DC
  Filled 2018-10-28: qty 1

## 2018-10-28 NOTE — Discharge Instructions (Signed)
Follow with Esaw Grandchild, NP in 5-7 days  Please get a complete blood count and chemistry panel checked by your Primary MD at your next visit, and again as instructed by your Primary MD. Please get your medications reviewed and adjusted by your Primary MD.  Please request your Primary MD to go over all Hospital Tests and Procedure/Radiological results at the follow up, please get all Hospital records sent to your Prim MD by signing hospital release before you go home.  If you had Pneumonia of Lung problems at the Hospital: Please get a 2 view Chest X ray done in 6-8 weeks after hospital discharge or sooner if instructed by your Primary MD.  If you have Congestive Heart Failure: Please call your Cardiologist or Primary MD anytime you have any of the following symptoms:  1) 3 pound weight gain in 24 hours or 5 pounds in 1 week  2) shortness of breath, with or without a dry hacking cough  3) swelling in the hands, feet or stomach  4) if you have to sleep on extra pillows at night in order to breathe  Follow cardiac low salt diet and 1.5 lit/day fluid restriction.  If you have diabetes Accuchecks 4 times/day, Once in AM empty stomach and then before each meal. Log in all results and show them to your primary doctor at your next visit. If any glucose reading is under 80 or above 300 call your primary MD immediately.  If you have Seizure/Convulsions/Epilepsy: Please do not drive, operate heavy machinery, participate in activities at heights or participate in high speed sports until you have seen by Primary MD or a Neurologist and advised to do so again.  If you had Gastrointestinal Bleeding: Please ask your Primary MD to check a complete blood count within one week of discharge or at your next visit. Your endoscopic/colonoscopic biopsies that are pending at the time of discharge, will also need to followed by your Primary MD.  Get Medicines reviewed and adjusted. Please take all your  medications with you for your next visit with your Primary MD  Please request your Primary MD to go over all hospital tests and procedure/radiological results at the follow up, please ask your Primary MD to get all Hospital records sent to his/her office.  If you experience worsening of your admission symptoms, develop shortness of breath, life threatening emergency, suicidal or homicidal thoughts you must seek medical attention immediately by calling 911 or calling your MD immediately  if symptoms less severe.  You must read complete instructions/literature along with all the possible adverse reactions/side effects for all the Medicines you take and that have been prescribed to you. Take any new Medicines after you have completely understood and accpet all the possible adverse reactions/side effects.   Do not drive or operate heavy machinery when taking Pain medications.   Do not take more than prescribed Pain, Sleep and Anxiety Medications  Special Instructions: If you have smoked or chewed Tobacco  in the last 2 yrs please stop smoking, stop any regular Alcohol  and or any Recreational drug use.  Wear Seat belts while driving.  Please note You were cared for by a hospitalist during your hospital stay. If you have any questions about your discharge medications or the care you received while you were in the hospital after you are discharged, you can call the unit and asked to speak with the hospitalist on call if the hospitalist that took care of you is not available.  Once you are discharged, your primary care physician will handle any further medical issues. Please note that NO REFILLS for any discharge medications will be authorized once you are discharged, as it is imperative that you return to your primary care physician (or establish a relationship with a primary care physician if you do not have one) for your aftercare needs so that they can reassess your need for medications and monitor your  lab values.  You can reach the hospitalist office at phone [phone] or fax [phone]   If you do not have a primary care physician, you can call [phone] for a physician referral.  Activity: As tolerated with Full fall precautions use walker/cane & assistance as needed  Diet: diabetic  Disposition Home

## 2018-10-28 NOTE — Discharge Summary (Addendum)
Physician Discharge Summary  Cynthia Moody GMW:[id number] DOB: November 03, 1971 DOA: 10/26/2018  PCP: Esaw Grandchild, NP  Admit date: 10/26/2018 Discharge date: 10/28/2018  Admitted From: Home Disposition:  Home  Recommendations for Outpatient Follow-up:  Follow up with endocrinology on discharge in 1 week  Home Health: Stable Equipment/Devices: None  Discharge Condition: Stable CODE STATUS: Full Diet recommendation: Heart modified diet  HPI: Per Dr. Marlowe Sax, Cynthia Moody is a 47 y.o. female with medical history significant of depression, type 1 diabetes, hyperlipidemia, GERD presenting to the hospital with a 1 day history of chills, bilious vomiting, and loose stools.  Reports having multiple episodes of vomiting and loose stools since yesterday.  Denies having any chest pain, shortness of breath, or abdominal pain.  Patient has been unable to tolerate much p.o. intake.  States she had the flu last month and symptoms resolved about 2 weeks ago. States she has type 1 diabetes for which she has an insulin pump and takes [place].  Hospital Course DKA (diabetic ketoacidosis) with type 1 diabetes mellitus-Patient came in with weakness, chills, vomiting and loose stool.  She was found to be in DKA in the ED and was admitted to the hospital.  She was placed on insulin infusion along with IV fluids, her bicarb has improved and her anion gap is closed.  She was initially maintained on Lantus and NovoLog, and was eventually transitioned to her home insulin pump on the day of discharge.  Her symptoms have resolved, she is able to tolerate a diabetic diet and will be discharged home in stable condition with outpatient endocrinology follow-up.  It is possible that Jardiance may have contributed to her DKA and will discontinue that on discharge.  Patient denies pump malfunction Acute kidney injury- Secondary to DKA and dehydration. Creatinine improved with IV fluids.  Creatinine normal on  discharge.   Discharge Diagnoses:  Principal Problem:   DKA (diabetic ketoacidoses) ([place]) Active Problems:   Type 1 diabetes ([place])   AKI (acute kidney injury) ([place])   Thrombocytosis ([place])   Discharge Instructions  Allergies as of 10/28/2018      Reactions   Lipitor [atorvastatin] Swelling   LEGS   Simvastatin Other (See Comments)   Severe leg cramps      Medication List    STOP taking these medications   JARDIANCE 25 MG Tabs tablet Generic drug:  empagliflozin     TAKE these medications   b complex vitamins tablet Take 1 tablet by mouth daily.   cholecalciferol 1000 units tablet Commonly known as:  VITAMIN D Take 1,000 Units by mouth daily.   fluticasone 50 MCG/ACT nasal spray Commonly known as:  FLONASE PLACE 1 SPRAY INTO BOTH NOSTRILS DAILY.   FREESTYLE LIBRE 14 DAY SENSOR Misc USE 1 UNITS EVERY 14 DAYS. TO CHECK BLLOD SUGAR CHENGE THE UNIT EVERY 14 DAYS   insulin lispro 100 UNIT/ML injection Commonly known as:  HUMALOG VIA INSULIN PUMP, total of 90 units/day   insulin pump Soln Inject into the skin continuous. Humalog   levonorgestrel 20 MCG/24HR IUD Commonly known as:  MIRENA 1 each by Intrauterine route once.   omeprazole 20 MG capsule Commonly known as:  PRILOSEC TAKE 1 CAPSULE BY MOUTH  DAILY   ondansetron 4 MG disintegrating tablet Commonly known as:  ZOFRAN-ODT Take 1 tablet (4 mg total) by mouth every 8 (eight) hours as needed for nausea or vomiting.   PARADIGM RESERVOIR 3ML Misc 1 Device by Does not apply route every 3 (  three) days.   PARADIGM QUICK-SET 32" 6MM Misc 1 Device by Does not apply route every 3 (three) days.   AUTOSOFT 90 INFUSION SET Misc CHANGE EVERY 2-3 DAYS   SLOW RELEASE IRON 45 MG Tbcr Generic drug:  Ferrous Sulfate Dried Take 45 mg by mouth daily.      Follow-up Information    Riccardo Dubin, [place]. Schedule an appointment as soon as possible for a visit in 1 week(s).   Specialty:  Internal  Medicine Contact information: [address] [place] [address] [phone]          Consultations:  None  Procedures/Studies:  Dg Chest Port 1 View  Result Date: 10/26/2018 CLINICAL DATA:  47 year old female with diabetic ketoacidosis. EXAM: PORTABLE CHEST 1 VIEW COMPARISON:  None. FINDINGS: The heart size and mediastinal contours are within normal limits. Both lungs are clear. The visualized skeletal structures are unremarkable. IMPRESSION: No active disease. Electronically Signed   By: Anner Crete M.D.   On: 10/26/2018 03:47   US Abdomen Limited Ruq  Result Date: 10/26/2018 CLINICAL DATA:  47 year old female with increased bilirubin. EXAM: ULTRASOUND ABDOMEN LIMITED RIGHT UPPER QUADRANT COMPARISON:  None. FINDINGS: Evaluation is limited due to body habitus. Gallbladder: No gallstones or wall thickening visualized. No sonographic Murphy sign noted by sonographer. Common bile duct: Diameter: 3 mm Liver: The liver is unremarkable as visualized. Portal vein is patent on color Doppler imaging with normal direction of blood flow towards the liver. IMPRESSION: Unremarkable right upper quadrant ultrasound. Electronically Signed   By: Anner Crete M.D.   On: 10/26/2018 04:49     Subjective:  - no chest pain, shortness of breath, no abdominal pain, nausea or vomiting.   Discharge Exam: Vitals:   10/27/18 2300 10/28/18 0843  BP: 126/60 123/72  Pulse: 92 (!) 104  Resp: 14 18  Temp: 98 F (36.7 C) 98.3 F (36.8 C)  SpO2: 100% 99%    General: Pt is alert, awake, not in acute distress Cardiovascular: RRR, S1/S2 +, no rubs, no gallops Respiratory: CTA bilaterally, no wheezing, no rhonchi Abdominal: Soft, NT, ND, bowel sounds + Extremities: no edema, no cyanosis    The results of significant diagnostics from this hospitalization (including imaging, microbiology, ancillary and laboratory) are listed below for reference.     Microbiology: Recent Results  (from the past 240 hour(s))  MRSA PCR Screening     Status: None   Collection Time: 10/26/18  2:03 PM  Result Value Ref Range Status   MRSA by PCR NEGATIVE NEGATIVE Final    Comment:        The GeneXpert MRSA Assay (FDA approved for NASAL specimens only), is one component of a comprehensive MRSA colonization surveillance program. It is not intended to diagnose MRSA infection nor to guide or monitor treatment for MRSA infections. Performed at [facility], [place] [address], [place], [place] [address]      Labs: BNP (last 3 results) No results for input(s): BNP in the last 8760 hours. Basic Metabolic Panel: Recent Labs  Lab 10/26/18 0305  10/26/18 2147 10/27/18 0219 10/27/18 0625 10/27/18 1240 10/28/18 0228  NA  --    < > 134* 134* 134* 136 136  K  --    < > 3.3* 3.1* 3.4* 4.0 3.5  CL  --    < > 105 102 105 105 106  CO2  --    < > 18* 17* 20* 15* 19*  GLUCOSE  --    < >  187* 185* 198* 198* 119*  BUN  --    < > 14 11 9 11 7   CREATININE  --    < > 1.26* 1.12* 1.13* 1.18* 1.05*  CALCIUM  --    < > 7.8* 7.7* 7.9* 8.3* 8.6*  MG 2.2  --   --   --   --   --   --    < > = values in this interval not displayed.   Liver Function Tests: Recent Labs  Lab 10/26/18 0054 10/28/18 0228  AST 20 15  ALT 14 12  ALKPHOS 92 60  BILITOT 1.6* 1.1  PROT 8.0 5.7*  ALBUMIN 4.4 3.1*   Recent Labs  Lab 10/26/18 0054  LIPASE 21   No results for input(s): AMMONIA in the last 168 hours. CBC: Recent Labs  Lab 10/26/18 0113 10/26/18 0836 10/28/18 0410  WBC 20.5* 28.2* 8.7  NEUTROABS 18.3*  --   --   HGB 15.5* 14.6 12.1  HCT 51.0* 50.8* 37.8  MCV 93.8 97.5 89.6  PLT 671* 597* 380   Cardiac Enzymes: Recent Labs  Lab 10/26/18 0335  TROPONINI <0.03   BNP: Invalid input(s): POCBNP CBG: Recent Labs  Lab 10/27/18 0810 10/27/18 1143 10/27/18 1657 10/27/18 2116 10/28/18 0724  GLUCAP 191* 191* 120* 186* 94   D-Dimer No results for input(s): DDIMER in the last 72  hours. Hgb A1c Recent Labs    10/26/18 0733  HGBA1C 7.9*   Lipid Profile No results for input(s): CHOL, HDL, LDLCALC, TRIG, CHOLHDL, LDLDIRECT in the last 72 hours. Thyroid function studies No results for input(s): TSH, T4TOTAL, T3FREE, THYROIDAB in the last 72 hours.  Invalid input(s): FREET3 Anemia work up No results for input(s): VITAMINB12, FOLATE, FERRITIN, TIBC, IRON, RETICCTPCT in the last 72 hours. Urinalysis    Component Value Date/Time   COLORURINE STRAW (A) 10/26/2018 0044   APPEARANCEUR CLEAR 10/26/2018 0044   LABSPEC 1.026 10/26/2018 0044   PHURINE 5.0 10/26/2018 0044   GLUCOSEU >=500 (A) 10/26/2018 0044   HGBUR MODERATE (A) 10/26/2018 0044   BILIRUBINUR NEGATIVE 10/26/2018 0044   KETONESUR 80 (A) 10/26/2018 0044   PROTEINUR NEGATIVE 10/26/2018 0044   NITRITE NEGATIVE 10/26/2018 0044   LEUKOCYTESUR NEGATIVE 10/26/2018 0044   Sepsis Labs Invalid input(s): PROCALCITONIN,  WBC,  LACTICIDVEN   Time coordinating discharge: 35 minutes  SIGNED:  Marzetta Board, MD  [facility] 10/28/2018, 1:34 PM Pager [phone]  If 7PM-7AM, please contact night-coverage [email or web address] Password [email or web address]

## 2018-10-28 NOTE — Progress Notes (Signed)
Inpatient Diabetes Program Recommendations  AACE/ADA: New Consensus Statement on Inpatient Glycemic Control (2019)  Target Ranges:  Prepandial:   less than 140 mg/dL      Peak postprandial:   less than 180 mg/dL (1-2 hours)      Critically ill patients:  140 - 180 mg/dL   Results for Cynthia Moody, Cynthia Moody (MRN [id number]) as of 10/28/2018 09:30  Ref. Range 10/27/2018 08:10 10/27/2018 11:43 10/27/2018 16:57 10/27/2018 21:16 10/28/2018 07:24  Glucose-Capillary Latest Ref Range: 70 - 99 mg/dL 191 (H)   Lantus 16 units @ 9:45 191 (H)  Novolog 3 units 120 (H) 186 (H) 94   Review of Glycemic Control  Diabetes history: DM1 Outpatient Diabetes medications: T-Slim insulin pump with Humalog, Jardiance 25 mg daily Current orders for Inpatient glycemic control: Insulin Pump ACHS &2am, Novolog 0-15 units TID with meals, Novolog 0-5 units QHS  Inpatient Diabetes Program Recommendations:   Insulin Pump: Noted patient is to restart insulin pump this morning. Per RN notes on MAR, patient has not restarted pump yet. Please have patient restart her insulin pump by 10:00 am (since she last received Lantus 16 units at 9:45 am on 10/27/18).  Correction (SSI): Once insulin pump is restarted, please discontinue Novolog correction scale.  Oral DM medication: Would recommend patient discontinue Jardiance at this time and follow up with Endocrinology.  Thanks, Barnie Alderman, RN, MSN, CDE Diabetes Coordinator Inpatient Diabetes Program [phone] (Team Pager from 8am to 5pm)

## 2018-11-03 DIAGNOSIS — E1042 Type 1 diabetes mellitus with diabetic polyneuropathy: Secondary | ICD-10-CM | POA: Diagnosis not present

## 2018-11-13 ENCOUNTER — Other Ambulatory Visit: Payer: Self-pay | Admitting: *Deleted

## 2018-11-13 NOTE — Patient Outreach (Signed)
[place] [facility]) Care Management  11/13/2018  Cynthia Moody 01-13-1971 [id number]   Subjective: Telephone call to patient's home  / mobile number, no answer, left HIPAA compliant voicemail message, and requested call back.     Objective: Per KPN (Knowledge Performance Now, point of care tool) and chart review, patent hospitalized 10/26/18 - 10/28/18 for DKA (diabetic ketoacidosis).   Patient also has a history of hyperlipidemia, acute kidney injury, and thrombocytosis.      Assessment:Received [facility] Consult referral on 11/11/18.  Transition of care follow up pending patient contact.      Plan:  RNCM will send unsuccessful outreach  letter, [facility] pamphlet, will call patient for 2nd telephone outreach attempt, transition of care follow up, and proceed with case closure, within 10 business days if no return call.        Der Gagliano H. Annia Friendly, BSN, [place] Management [facility] Telephonic CM Phone: [phone] Fax: [phone]

## 2018-11-18 ENCOUNTER — Other Ambulatory Visit: Payer: Self-pay | Admitting: *Deleted

## 2018-11-18 NOTE — Patient Outreach (Signed)
[place] [facility]) Care Management  11/18/2018  TASHIA LEITERMAN 06/04/1971 [id number]   Subjective: Telephone call to patient's home  / mobile number, no answer, left HIPAA compliant voicemail message, and requested call back.     Objective: Per KPN (Knowledge Performance Now, point of care tool) and chart review, patent hospitalized 10/26/18 - 10/28/18 for DKA (diabetic ketoacidosis).   Patient also has a history of hyperlipidemia, acute kidney injury, and thrombocytosis.      Assessment:Received [facility] Consult referral on 11/11/18.  Transition of care follow up pending patient contact.      Plan:  RNCM has sent unsuccessful outreach  letter, [facility] pamphlet, will call patient for 3rd telephone outreach attempt, transition of care follow up, and proceed with case closure, within 10 business days if no return call.       Annalucia Laino H. Annia Friendly, BSN, [place] Management [facility] Telephonic CM Phone: [phone] Fax: [phone]

## 2018-11-24 ENCOUNTER — Other Ambulatory Visit: Payer: Self-pay | Admitting: *Deleted

## 2018-11-24 ENCOUNTER — Ambulatory Visit: Payer: Self-pay | Admitting: *Deleted

## 2018-11-24 ENCOUNTER — Encounter: Payer: Self-pay | Admitting: *Deleted

## 2018-11-24 NOTE — Patient Outreach (Addendum)
[place] [facility]) Care Management  11/24/2018  Cynthia Moody 06/18/1971 [id number]   Subjective: Telephone call to patient's home / mobile number, spoke with patient, and HIPAA verified.  Discussed [facility] Care Management [employer]  Transition of care / [facility] Consult  follow up, patient voiced understanding, and is in agreement to follow up.  Patient states she is feeling much better, had follow up visit with diabetes provider, insulin pump educator, on 11/03/18, and has another follow up appointment on 12/03/18 with this provider.   States she has also returned to work, returned within 2 days of hospital discharge,  and this was her first inpatient hospitalization in approximately 10 years.   States her hospitalization may have been related to Jardiance medication, medication has been discontinued, and her insulin pump has been adjusted by provider.  Patient states she is able to manage self care and has assistance as needed.  Patient voices understanding of medical diagnosis and treatment plan.  States she is accessing her [employer] benefits as needed via member services number on back of card.  Patient states she does not have any education material, transition of care, care coordination, disease management, disease monitoring, transportation, community resource, or pharmacy needs at this time.  Discussed diabetes disease management resources for [facility] plan members, patient voiced understanding, and decline referral at this time.  Patient states her diabetes provider is managing diabetes, no assistance needed at this time, and is aware of how access [employer] diabetes resource if needed in the future.  States she is very appreciative of the follow up and is in agreement to receive [place] Management information.     Objective:Per KPN (Knowledge Performance Now, point of care tool) and chart review,patent hospitalized 10/26/18 - 10/28/18 forDKA (diabetic ketoacidosis). Patient also has a history of  hyperlipidemia, acute kidney injury, and thrombocytosis.      Assessment:Received [facility] Consult referral on 11/11/18.Transition of care follow up completed, no care management needs, and will proceed with case closure.       Plan:RNCM will send patient successful outreach letter, [facility] pamphlet, and magnet. RNCM will complete case closure due to follow up completed / no care management needs.  RNCM will send Bary Castilla at [place] Management update on referral status.         Keily Lepp H. Annia Friendly, BSN, [place] Management [facility] Telephonic CM Phone: [phone] Fax: [phone]

## 2018-11-26 DIAGNOSIS — H2511 Age-related nuclear cataract, right eye: Secondary | ICD-10-CM | POA: Diagnosis not present

## 2018-11-26 DIAGNOSIS — E103313 Type 1 diabetes mellitus with moderate nonproliferative diabetic retinopathy with macular edema, bilateral: Secondary | ICD-10-CM | POA: Diagnosis not present

## 2018-12-03 DIAGNOSIS — E1042 Type 1 diabetes mellitus with diabetic polyneuropathy: Secondary | ICD-10-CM | POA: Diagnosis not present

## 2018-12-12 DIAGNOSIS — Z794 Long term (current) use of insulin: Secondary | ICD-10-CM | POA: Diagnosis not present

## 2018-12-12 DIAGNOSIS — E109 Type 1 diabetes mellitus without complications: Secondary | ICD-10-CM | POA: Diagnosis not present

## 2018-12-12 DIAGNOSIS — E108 Type 1 diabetes mellitus with unspecified complications: Secondary | ICD-10-CM | POA: Diagnosis not present

## 2018-12-12 DIAGNOSIS — E1042 Type 1 diabetes mellitus with diabetic polyneuropathy: Secondary | ICD-10-CM | POA: Diagnosis not present

## 2018-12-31 DIAGNOSIS — E1042 Type 1 diabetes mellitus with diabetic polyneuropathy: Secondary | ICD-10-CM | POA: Diagnosis not present

## 2019-01-01 DIAGNOSIS — E1042 Type 1 diabetes mellitus with diabetic polyneuropathy: Secondary | ICD-10-CM | POA: Diagnosis not present

## 2019-01-01 DIAGNOSIS — Z6833 Body mass index (BMI) 33.0-33.9, adult: Secondary | ICD-10-CM | POA: Diagnosis not present

## 2019-01-01 DIAGNOSIS — E6609 Other obesity due to excess calories: Secondary | ICD-10-CM | POA: Diagnosis not present

## 2019-01-01 DIAGNOSIS — E785 Hyperlipidemia, unspecified: Secondary | ICD-10-CM | POA: Diagnosis not present

## 2019-01-22 ENCOUNTER — Encounter: Payer: Self-pay | Admitting: Obstetrics and Gynecology

## 2019-01-22 ENCOUNTER — Telehealth: Payer: Self-pay | Admitting: Obstetrics and Gynecology

## 2019-01-22 NOTE — Telephone Encounter (Signed)
Patient returning call to Triage Nurse requesting a return call to direct work number at [phone] ext [phone].

## 2019-01-22 NOTE — Telephone Encounter (Signed)
Patient sent the following correspondence through [place]. Routing to triage to assist patient with request.  Is it normal to have restarted my period after months without one due to the Mirena. I haven't had any menstrual cycle for over a year and this week I'm having heavier spotting than normal that I need to have a pad but it stops and starts. nothing has changed of my normal health or daily life that would explain this

## 2019-01-22 NOTE — Telephone Encounter (Signed)
Message left to return call to Triage Nurse at [phone].    

## 2019-01-22 NOTE — Telephone Encounter (Signed)
Returned call to patient. Patient states she started a cycle on 01-19-2019 after not having one for over a year. Patient describes bleeding as "light- heavier when wiping." Patient states she is having some very mild cramping. Takes ibuprofen for HA so states that could be keeping cramping mild. Patient denies nausea, vomiting, fever, chills or odor to discharge. OV recommended. Patient requesting to schedule this upcoming Tuesday. Patient scheduled for 01-27-2019 at 1415. Patient agreeable to date and time of appointment. Pain/bleeding precautions reviewed with patient and she verbalized understanding.   Routing to provider and will close encounter.

## 2019-01-27 ENCOUNTER — Ambulatory Visit: Payer: 59 | Admitting: Obstetrics and Gynecology

## 2019-01-27 ENCOUNTER — Other Ambulatory Visit: Payer: 59

## 2019-01-27 ENCOUNTER — Other Ambulatory Visit: Payer: Self-pay

## 2019-01-27 ENCOUNTER — Encounter: Payer: Self-pay | Admitting: Obstetrics and Gynecology

## 2019-01-27 VITALS — BP 138/84 | HR 80 | Wt 221.0 lb

## 2019-01-27 DIAGNOSIS — Z0000 Encounter for general adult medical examination without abnormal findings: Secondary | ICD-10-CM

## 2019-01-27 DIAGNOSIS — E785 Hyperlipidemia, unspecified: Secondary | ICD-10-CM

## 2019-01-27 DIAGNOSIS — N921 Excessive and frequent menstruation with irregular cycle: Secondary | ICD-10-CM | POA: Diagnosis not present

## 2019-01-27 DIAGNOSIS — D473 Essential (hemorrhagic) thrombocythemia: Secondary | ICD-10-CM | POA: Diagnosis not present

## 2019-01-27 DIAGNOSIS — E109 Type 1 diabetes mellitus without complications: Secondary | ICD-10-CM | POA: Diagnosis not present

## 2019-01-27 DIAGNOSIS — Z975 Presence of (intrauterine) contraceptive device: Secondary | ICD-10-CM | POA: Diagnosis not present

## 2019-01-27 DIAGNOSIS — D75839 Thrombocytosis, unspecified: Secondary | ICD-10-CM

## 2019-01-27 LAB — POCT URINE PREGNANCY: PREG TEST UR: NEGATIVE

## 2019-01-27 NOTE — Progress Notes (Signed)
GYNECOLOGY  VISIT   HPI: 48 y.o.   Married White or Caucasian Not Hispanic or Latino  female   [id number] with Patient's last menstrual period was 01/18/2019 (exact date).   here for bleeding with Mirena IUD.  Her mirena was placed in 7/18 for contraception and cycle control. Reports she started bleeding on 01/18/2019, bleed x 4-5 days. Changing a pad once a day. Reports mild cramping. She has had some intermittent spotting since the IUD was inserted but nothing more than that until this cycle.  Sexually active, no dyspareunia. She has had some increase in stress at work this month. 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   GYNECOLOGIC HISTORY: Patient's last menstrual period was 01/18/2019 (exact date). Contraception: IUD Menopausal hormone therapy: None         OB History    Gravida  1   Para  1   Term  1   Preterm      AB      Living  1     SAB      TAB      Ectopic      Multiple      Live Births  1              Patient Active Problem List   Diagnosis Date Noted  . DKA (diabetic ketoacidoses) ([place]) 10/26/2018  . Type 1 diabetes ([place]) 10/26/2018  . AKI (acute kidney injury) ([place]) 10/26/2018  . Thrombocytosis ([place]) 10/26/2018  . Fever 10/09/2018  . Cough 10/09/2018  . Healthcare maintenance 01/09/2018  . Poor dentition 01/09/2018  . Bronchitis, acute 07/25/2017  . Leg wound, right 07/25/2017  . Sinusitis, acute maxillary 07/25/2017  . Ingrown toenail 06/03/2017  . Yeast infection 04/29/2017  . 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 Diabetes ([place]) 08/16/2015  . Dyslipidemia 08/16/2015  . GERD (gastroesophageal reflux disease) 08/16/2015  . Anemia, iron deficiency 08/16/2015    Past Medical History:  Diagnosis Date  . Acne   . Anemia   . Cough 01/27/2016  . Depression   . Diabetes mellitus type 1 ([place])   . Diabetic neuropathy (H[facility])    feet  . Diabetic retinopathy ([place])    bilateral  .  Dyslipidemia   . GERD (gastroesophageal reflux disease)   . Infertility, female   . Post-nasal drip 01/27/2016  . Shoulder pain, bilateral    wakes up frequently due to pain  . Stuffy nose 01/27/2016  . Trigger finger of right hand 01/2016   index finger    Past Surgical History:  Procedure Laterality Date  . CESAREAN SECTION  01/17/1995  . INTRAUTERINE DEVICE (IUD) INSERTION  06/2017   Mirena   . SHOULDER SURGERY Left 06/29/2013   exc. bone spur  . TRIGGER FINGER RELEASE Right 02/02/2016   Procedure: RIGHT INDEX RELEASE TRIGGER  ;  Surgeon: Leanora Cover, MD;  Location: [facility];  Service: Orthopedics;  Laterality: Right;    Current Outpatient Medications  Medication Sig Dispense Refill  . b complex vitamins tablet Take 1 tablet by mouth daily.    . cholecalciferol (VITAMIN D) 1000 UNITS tablet Take 1,000 Units by mouth daily.    . Continuous Blood Gluc Sensor (FREESTYLE LIBRE 14 DAY SENSOR) MISC USE 1 UNITS EVERY 14 DAYS. TO CHECK BLLOD SUGAR CHENGE THE UNIT EVERY 14 DAYS  11  . Ferrous Sulfate Dried (SLOW RELEASE IRON) 45 MG TBCR Take 45 mg by mouth daily.     [name] Kitchen  fluticasone (FLONASE) 50 MCG/ACT nasal spray PLACE 1 SPRAY INTO BOTH NOSTRILS DAILY. 16 g 2  . Insulin Human (INSULIN PUMP) SOLN Inject into the skin continuous. Humalog    . Insulin Infusion Pump Supplies (AUTOSOFT 90 INFUSION SET) MISC CHANGE EVERY 2-3 DAYS    . Insulin Infusion Pump Supplies (PARADIGM QUICK-SET 32" 6MM) MISC 1 Device by Does not apply route every 3 (three) days.    . Insulin Infusion Pump Supplies (PARADIGM RESERVOIR 3ML) MISC 1 Device by Does not apply route every 3 (three) days.    . insulin lispro (HUMALOG) 100 UNIT/ML injection VIA INSULIN PUMP, total of 90 units/day 90 mL 3  . levonorgestrel (MIRENA) 20 MCG/24HR IUD 1 each by Intrauterine route once.    [name] Kitchen omeprazole (PRILOSEC) 20 MG capsule TAKE 1 CAPSULE BY MOUTH  DAILY (Patient taking differently: Take 20 mg by mouth daily. ) 90 capsule 2   . ondansetron (ZOFRAN-ODT) 4 MG disintegrating tablet Take 1 tablet (4 mg total) by mouth every 8 (eight) hours as needed for nausea or vomiting. 20 tablet 0   No current facility-administered medications for this visit.    Facility-Administered Medications Ordered in Other Visits  Medication Dose Route Frequency Provider Last Rate Last Dose  . heparin lock flush 100 unit/mL  500 Units Intracatheter Once PRN Brunetta Genera, MD      . sodium chloride 0.9 % injection 10 mL  10 mL Intracatheter PRN Brunetta Genera, MD         ALLERGIES: Lipitor [atorvastatin] and Simvastatin  Family History  Problem Relation Age of Onset  . Diabetes Mother   . Heart attack Mother        Age 1  . Hypertension Mother   . Heart attack Father        Age 27  . Hyperlipidemia Father   . Diabetes Maternal Uncle   . Diabetes Maternal Grandmother     Social History   Socioeconomic History  . Marital status: Married    Spouse name: Not on file  . Number of children: Not on file  . Years of education: Not on file  . Highest education level: Not on file  Occupational History  . Not on file  Social Needs  . Financial resource strain: Not on file  . Food insecurity:    Worry: Not on file    Inability: Not on file  . Transportation needs:    Medical: Not on file    Non-medical: Not on file  Tobacco Use  . Smoking status: Never Smoker  . Smokeless tobacco: Never Used  Substance and Sexual Activity  . Alcohol use: Yes    Comment: occasionally  . Drug use: No  . Sexual activity: Yes    Partners: Male    Birth control/protection: I.U.D.  Lifestyle  . Physical activity:    Days per week: Not on file    Minutes per session: Not on file  . Stress: Not on file  Relationships  . Social connections:    Talks on phone: Not on file    Gets together: Not on file    Attends religious service: Not on file    Active member of club or organization: Not on file    Attends meetings of clubs  or organizations: Not on file    Relationship status: Not on file  . Intimate partner violence:    Fear of current or ex partner: Not on file    Emotionally abused: Not on  file    Physically abused: Not on file    Forced sexual activity: Not on file  Other Topics Concern  . Not on file  Social History Narrative  . Not on file    Review of Systems  Constitutional: Negative.   HENT: Positive for nosebleeds and sinus pain.   Eyes: Negative.   Respiratory: Negative.   Cardiovascular: Negative.   Gastrointestinal: Negative.   Genitourinary:       Menstrual changes Vaginal itching  Musculoskeletal: Negative.   Skin: Negative.   Neurological: Negative.   Endo/Heme/Allergies: Negative.   Psychiatric/Behavioral: Negative.     PHYSICAL EXAMINATION:    BP 138/84 (BP Location: Right Arm, Patient Position: Sitting, Cuff Size: Normal)   Pulse 80   Wt 221 lb (100.2 kg)   LMP 01/18/2019 (Exact Date)   BMI 35.67 kg/m     General appearance: alert, cooperative and appears stated age  Pelvic: External genitalia:  no lesions              Urethra:  normal appearing urethra with no masses, tenderness or lesions              Bartholins and Skenes: normal                 Vagina: normal appearing vagina with normal color and discharge, no lesions              Cervix: no lesions and IUD strings are 3 cm              Bimanual Exam:  Uterus:  normal size, contour, position, consistency, mobility, non-tender              Adnexa: no mass, fullness, tenderness               Chaperone was present for exam.  ASSESSMENT Breakthrough bleeding with mirena IUD, not heavy. Normal exam    PLAN UPT negative Calendar bleeding F/U for an annual exam in 5/20 Call with any concerns   An After Visit Summary was printed and given to the patient.

## 2019-01-28 ENCOUNTER — Encounter: Payer: Self-pay | Admitting: Adult Health

## 2019-01-28 LAB — COMPREHENSIVE METABOLIC PANEL
A/G RATIO: 1.8 (ref 1.2–2.2)
ALT: 10 IU/L (ref 0–32)
AST: 13 IU/L (ref 0–40)
Albumin: 4.2 g/dL (ref 3.8–4.8)
Alkaline Phosphatase: 112 IU/L (ref 39–117)
BILIRUBIN TOTAL: 0.2 mg/dL (ref 0.0–1.2)
BUN / CREAT RATIO: 12 (ref 9–23)
BUN: 12 mg/dL (ref 6–24)
CHLORIDE: 99 mmol/L (ref 96–106)
CO2: 24 mmol/L (ref 20–29)
Calcium: 9.4 mg/dL (ref 8.7–10.2)
Creatinine, Ser: 0.97 mg/dL (ref 0.57–1.00)
GFR, EST AFRICAN AMERICAN: 82 mL/min/{1.73_m2} (ref >59)
GFR, EST NON AFRICAN AMERICAN: 71 mL/min/{1.73_m2} (ref >59)
GLOBULIN, TOTAL: 2.4 g/dL (ref 1.5–4.5)
Glucose: 186 mg/dL — ABNORMAL HIGH (ref 65–99)
POTASSIUM: 4.5 mmol/L (ref 3.5–5.2)
SODIUM: 139 mmol/L (ref 134–144)
TOTAL PROTEIN: 6.6 g/dL (ref 6.0–8.5)

## 2019-01-28 LAB — CBC WITH DIFFERENTIAL/PLATELET
BASOS ABS: 0 10*3/uL (ref 0.0–0.2)
BASOS: 1 %
EOS (ABSOLUTE): 0.2 10*3/uL (ref 0.0–0.4)
Eos: 2 %
Hematocrit: 39.9 % (ref 34.0–46.6)
Hemoglobin: 13.7 g/dL (ref 11.1–15.9)
Immature Grans (Abs): 0 10*3/uL (ref 0.0–0.1)
Immature Granulocytes: 0 %
LYMPHS ABS: 2.1 10*3/uL (ref 0.7–3.1)
Lymphs: 28 %
MCH: 30.6 pg (ref 26.6–33.0)
MCHC: 34.3 g/dL (ref 31.5–35.7)
MCV: 89 fL (ref 79–97)
MONOS ABS: 0.5 10*3/uL (ref 0.1–0.9)
Monocytes: 7 %
NEUTROS ABS: 4.6 10*3/uL (ref 1.4–7.0)
Neutrophils: 62 %
PLATELETS: 369 10*3/uL (ref 150–450)
RBC: 4.48 x10E6/uL (ref 3.77–5.28)
RDW: 11.7 % (ref 11.7–15.4)
WBC: 7.5 10*3/uL (ref 3.4–10.8)

## 2019-01-28 LAB — LIPID PANEL
CHOL/HDL RATIO: 3.5 ratio (ref 0.0–4.4)
Cholesterol, Total: 193 mg/dL (ref 100–199)
HDL: 55 mg/dL (ref >39)
LDL Calculated: 118 mg/dL — ABNORMAL HIGH (ref 0–99)
TRIGLYCERIDES: 100 mg/dL (ref 0–149)
VLDL Cholesterol Cal: 20 mg/dL (ref 5–40)

## 2019-01-28 LAB — HEMOGLOBIN A1C
ESTIMATED AVERAGE GLUCOSE: 209 mg/dL
HEMOGLOBIN A1C: 8.9 % — AB (ref 4.8–5.6)

## 2019-01-28 LAB — TSH: TSH: 1.51 u[IU]/mL (ref 0.450–4.500)

## 2019-01-29 ENCOUNTER — Encounter: Payer: Self-pay | Admitting: Adult Health

## 2019-02-03 ENCOUNTER — Encounter: Payer: 59 | Admitting: Adult Health

## 2019-02-11 NOTE — Progress Notes (Signed)
Subjective:    Patient ID: Cynthia Moody, female    DOB: 1971/02/24, 48 y.o.   MRN: [id number]  HPI:  Ms. Riederer is here for CPE She reports medication compliance, denies SE Reviewed recent labs- TSH-WNL, 43 The 10-year ASCVD risk score Mikey Bussing DC Brooke Bonito., et al., 2013) is: 1.7% Values used to calculate the score:  Age: 35 years  Sex: Female  Is Non-Hispanic African American: No  Diabetic: Yes  Tobacco smoker: No  Systolic Blood Pressure: 017 mmHg  Is BP treated: No  HDL Cholesterol: 55 mg/dL  Total Cholesterol: 193 mg/dL LDL-118 She is statin intolerant- encouraged her to focus on heart healthy diet and increasing regular exercise  Big bump in A1c for her- 8.9- followed by Endocrinology CMP-stable CBC-stable  She reports non-productive cough and copious clear nasal drainage the last 4 months She denies fever/night sweats/N/V/D  Healthcare Maintenance: PAP-UTD, last 05/15/17 normal Mammogram-Ordered Immunizations-UTD  Patient Care Team    Relationship Specialty Notifications Start End  Esaw Grandchild, NP PCP - General Family Medicine  04/29/17     Patient Active Problem List   Diagnosis Date Noted  . Screening for breast cancer 02/16/2019  . DKA (diabetic ketoacidoses) ([place]) 10/26/2018  . Type 1 diabetes ([place]) 10/26/2018  . AKI (acute kidney injury) ([place]) 10/26/2018  . Thrombocytosis ([place]) 10/26/2018  . Fever 10/09/2018  . Cough 10/09/2018  . Healthcare maintenance 01/09/2018  . Poor dentition 01/09/2018  . Bronchitis, acute 07/25/2017  . Leg wound, right 07/25/2017  . Sinusitis, acute maxillary 07/25/2017  . Ingrown toenail 06/03/2017  . Yeast infection 04/29/2017  . 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 Diabetes ([place]) 08/16/2015  . Dyslipidemia 08/16/2015  . GERD (gastroesophageal reflux disease) 08/16/2015  . Anemia, iron deficiency 08/16/2015     Past Medical History:  Diagnosis Date  . Acne   .  Anemia   . Cough 01/27/2016  . Depression   . Diabetes mellitus type 1 ([place])   . Diabetic neuropathy ([facility])    feet  . Diabetic retinopathy ([place])    bilateral  . Dyslipidemia   . GERD (gastroesophageal reflux disease)   . Infertility, female   . Post-nasal drip 01/27/2016  . Shoulder pain, bilateral    wakes up frequently due to pain  . Stuffy nose 01/27/2016  . Trigger finger of right hand 01/2016   index finger     Past Surgical History:  Procedure Laterality Date  . CESAREAN SECTION  01/17/1995  . INTRAUTERINE DEVICE (IUD) INSERTION  06/2017   Mirena   . SHOULDER SURGERY Left 06/29/2013   exc. bone spur  . TRIGGER FINGER RELEASE Right 02/02/2016   Procedure: RIGHT INDEX RELEASE TRIGGER  ;  Surgeon: Leanora Cover, MD;  Location: [facility];  Service: Orthopedics;  Laterality: Right;     Family History  Problem Relation Age of Onset  . Diabetes Mother   . Heart attack Mother        Age 49  . Hypertension Mother   . Heart attack Father        Age 79  . Hyperlipidemia Father   . Diabetes Maternal Uncle   . Diabetes Maternal Grandmother      Social History   Substance and Sexual Activity  Drug Use No     Social History   Substance and Sexual Activity  Alcohol Use Yes   Comment: occasionally     Social History   Tobacco Use  Smoking Status  Never Smoker  Smokeless Tobacco Never Used     Outpatient Encounter Medications as of 02/16/2019  Medication Sig Note  . b complex vitamins tablet Take 1 tablet by mouth daily.   . cholecalciferol (VITAMIN D) 1000 UNITS tablet Take 1,000 Units by mouth daily.   . Continuous Blood Gluc Sensor (FREESTYLE LIBRE 14 DAY SENSOR) MISC USE 1 UNITS EVERY 14 DAYS. TO CHECK BLLOD SUGAR CHENGE THE UNIT EVERY 14 DAYS   . Ferrous Sulfate Dried (SLOW RELEASE IRON) 45 MG TBCR Take 45 mg by mouth daily.    . fluticasone (FLONASE) 50 MCG/ACT nasal spray PLACE 1 SPRAY INTO BOTH NOSTRILS DAILY. 11/24/2018: States takes as needed,  MD aware.   . Insulin Human (INSULIN PUMP) SOLN Inject into the skin continuous. Humalog   . Insulin Infusion Pump Supplies (AUTOSOFT 90 INFUSION SET) MISC CHANGE EVERY 2-3 DAYS   . Insulin Infusion Pump Supplies (PARADIGM QUICK-SET 32" 6MM) MISC 1 Device by Does not apply route every 3 (three) days.   . Insulin Infusion Pump Supplies (PARADIGM RESERVOIR 3ML) MISC 1 Device by Does not apply route every 3 (three) days.   . insulin lispro (HUMALOG) 100 UNIT/ML injection VIA INSULIN PUMP, total of 90 units/day   . levonorgestrel (MIRENA) 20 MCG/24HR IUD 1 each by Intrauterine route once.   [name] Kitchen omeprazole (PRILOSEC) 20 MG capsule TAKE 1 CAPSULE BY MOUTH  DAILY (Patient taking differently: Take 20 mg by mouth daily. )   . azithromycin (ZITHROMAX) 250 MG tablet 2 tabs day one.  1 tabs days two-five   . benzonatate (TESSALON) 200 MG capsule Take 1 capsule (200 mg total) by mouth 2 (two) times daily as needed for cough.   . [DISCONTINUED] ondansetron (ZOFRAN-ODT) 4 MG disintegrating tablet Take 1 tablet (4 mg total) by mouth every 8 (eight) hours as needed for nausea or vomiting.    Facility-Administered Encounter Medications as of 02/16/2019  Medication  . heparin lock flush 100 unit/mL  . sodium chloride 0.9 % injection 10 mL    Allergies: Lipitor [atorvastatin] and Simvastatin  Body mass index is 33.59 kg/m.  Blood pressure 114/76, pulse 90, temperature 98.2 F (36.8 C), temperature source Oral, height 5\' 8"  (1.727 m), weight 220 lb 14.4 oz (100.2 kg), last menstrual period 01/18/2019, SpO2 98 %.  Review of Systems  Constitutional: Positive for fatigue. Negative for activity change, appetite change, chills, diaphoresis, fever and unexpected weight change.  HENT: Positive for congestion, postnasal drip, rhinorrhea and sinus pressure. Negative for sinus pain, sore throat, trouble swallowing and voice change.   Eyes: Negative for visual disturbance.  Respiratory: Positive for cough. Negative for  chest tightness, shortness of breath, wheezing and stridor.   Cardiovascular: Negative for chest pain, palpitations and leg swelling.  Gastrointestinal: Negative for abdominal distention, abdominal pain, blood in stool, constipation, diarrhea, nausea and vomiting.  Endocrine: Negative for cold intolerance, heat intolerance, polydipsia, polyphagia and polyuria.  Genitourinary: Negative for difficulty urinating and dysuria.  Musculoskeletal: Negative for arthralgias, back pain, gait problem, joint swelling, myalgias, neck pain and neck stiffness.  Skin: Negative for color change, pallor, rash and wound.  Neurological: Negative for dizziness and headaches.  Hematological: Does not bruise/bleed easily.  Psychiatric/Behavioral: Negative for behavioral problems, confusion, decreased concentration, dysphoric mood, hallucinations, self-injury, sleep disturbance and suicidal ideas. The patient is not nervous/anxious and is not hyperactive.        Objective:   Physical Exam Vitals signs and nursing note reviewed.  Constitutional:  General: She is not in acute distress.    Appearance: She is obese. She is not ill-appearing, toxic-appearing or diaphoretic.  HENT:     Head: Normocephalic and atraumatic.     Right Ear: Tympanic membrane, ear canal and external ear normal. There is no impacted cerumen.     Left Ear: Ear canal and external ear normal. There is no impacted cerumen.     Nose: Congestion and rhinorrhea present.     Mouth/Throat:     Mouth: Mucous membranes are dry.     Pharynx: Oropharynx is clear. Posterior oropharyngeal erythema present.  Eyes:     Extraocular Movements: Extraocular movements intact.     Conjunctiva/sclera: Conjunctivae normal.     Pupils: Pupils are equal, round, and reactive to light.  Neck:     Musculoskeletal: Normal range of motion.  Cardiovascular:     Rate and Rhythm: Normal rate.     Pulses: Normal pulses.     Heart sounds: Normal heart sounds. No  murmur. No friction rub. No gallop.   Pulmonary:     Effort: Pulmonary effort is normal. No respiratory distress.     Breath sounds: Normal breath sounds. No stridor. No wheezing, rhonchi or rales.  Chest:     Chest wall: No tenderness.  Abdominal:     General: Abdomen is protuberant. Bowel sounds are normal. There is no distension.     Palpations: There is no mass.     Tenderness: There is no abdominal tenderness. There is no right CVA tenderness, left CVA tenderness, guarding or rebound.     Hernia: No hernia is present.  Lymphadenopathy:     Cervical: No cervical adenopathy.  Skin:    General: Skin is warm and dry.     Capillary Refill: Capillary refill takes less than 2 seconds.  Neurological:     Mental Status: She is alert and oriented to person, place, and time.  Psychiatric:        Mood and Affect: Mood normal.        Behavior: Behavior normal.        Thought Content: Thought content normal.        Judgment: Judgment normal.       Assessment & Plan:   1. Screening for breast cancer   2. Healthcare maintenance   3. Acute bronchitis, unspecified organism     Screening for breast cancer Mammogram ordered  Healthcare maintenance  Please continue all medications as directed. Continue to drink plenty of water and follow diabetic diet consistently. To treat bronchitis- please take Azithromycin and Tessalon as directed. Once you are feeling better, increase regular exercise. Good luck with merger at work. Follow-up here in 6 months.  Bronchitis, acute Please continue all medications as directed. Continue to drink plenty of water and follow diabetic diet consistently. To treat bronchitis- please take Azithromycin and Tessalon as directed.   FOLLOW-UP:  Return in about 6 months (around 08/17/2019) for Regular Follow Up, Obesity, Hypercholestermia, Diabetes.

## 2019-02-16 ENCOUNTER — Encounter: Payer: Self-pay | Admitting: Adult Health

## 2019-02-16 ENCOUNTER — Ambulatory Visit (INDEPENDENT_AMBULATORY_CARE_PROVIDER_SITE_OTHER): Payer: 59 | Admitting: Adult Health

## 2019-02-16 VITALS — BP 114/76 | HR 90 | Temp 98.2°F | Ht 68.0 in | Wt 220.9 lb

## 2019-02-16 DIAGNOSIS — J209 Acute bronchitis, unspecified: Secondary | ICD-10-CM | POA: Diagnosis not present

## 2019-02-16 DIAGNOSIS — Z1239 Encounter for other screening for malignant neoplasm of breast: Secondary | ICD-10-CM | POA: Insufficient documentation

## 2019-02-16 DIAGNOSIS — Z0000 Encounter for general adult medical examination without abnormal findings: Secondary | ICD-10-CM | POA: Diagnosis not present

## 2019-02-16 MED ORDER — BENZONATATE 200 MG PO CAPS: 200.0000 mg | ORAL_CAPSULE | Freq: Two times a day (BID) | ORAL | 0 refills | Status: DC | PRN

## 2019-02-16 MED ORDER — AZITHROMYCIN 250 MG PO TABS: ORAL_TABLET | ORAL | 0 refills | Status: DC

## 2019-02-16 NOTE — Assessment & Plan Note (Signed)
Please continue all medications as directed. Continue to drink plenty of water and follow diabetic diet consistently. To treat bronchitis- please take Azithromycin and Tessalon as directed.

## 2019-02-16 NOTE — Assessment & Plan Note (Signed)
Mammogram ordered

## 2019-02-16 NOTE — Patient Instructions (Addendum)
Preventive Care for Adults, Female  A healthy lifestyle and preventive care can promote health and wellness. Preventive health guidelines for women include the following key practices.   A routine yearly physical is a good way to check with your health care provider about your health and preventive screening. It is a chance to share any concerns and updates on your health and to receive a thorough exam.   Visit your dentist for a routine exam and preventive care every 6 months. Brush your teeth twice a day and floss once a day. Good oral hygiene prevents tooth decay and gum disease.   The frequency of eye exams is based on your age, health, family medical history, use of contact lenses, and other factors. Follow your health care provider's recommendations for frequency of eye exams.   Eat a healthy diet. Foods like vegetables, fruits, whole grains, low-fat dairy products, and lean protein foods contain the nutrients you need without too many calories. Decrease your intake of foods high in solid fats, added sugars, and salt. Eat the right amount of calories for you.Get information about a proper diet from your health care provider, if necessary.   Regular physical exercise is one of the most important things you can do for your health. Most adults should get at least 150 minutes of moderate-intensity exercise (any activity that increases your heart rate and causes you to sweat) each week. In addition, most adults need muscle-strengthening exercises on 2 or more days a week.   Maintain a healthy weight. The body mass index (BMI) is a screening tool to identify possible weight problems. It provides an estimate of body fat based on height and weight. Your health care provider can find your BMI, and can help you achieve or maintain a healthy weight.For adults 20 years and older:   - A BMI below 18.5 is considered underweight.   - A BMI of 18.5 to 24.9 is normal.   - A BMI of 25 to 29.9 is  considered overweight.   - A BMI of 30 and above is considered obese.   Maintain normal blood lipids and cholesterol levels by exercising and minimizing your intake of trans and saturated fats.  Eat a balanced diet with plenty of fruit and vegetables. Blood tests for lipids and cholesterol should begin at age 20 and be repeated every 5 years minimum.  If your lipid or cholesterol levels are high, you are over 40, or you are at high risk for heart disease, you may need your cholesterol levels checked more frequently.Ongoing high lipid and cholesterol levels should be treated with medicines if diet and exercise are not working.   If you smoke, find out from your health care provider how to quit. If you do not use tobacco, do not start.   Lung cancer screening is recommended for adults aged 55-80 years who are at high risk for developing lung cancer because of a history of smoking. A yearly low-dose CT scan of the lungs is recommended for people who have at least a 30-pack-year history of smoking and are a current smoker or have quit within the past 15 years. A pack year of smoking is smoking an average of 1 pack of cigarettes a day for 1 year (for example: 1 pack a day for 30 years or 2 packs a day for 15 years). Yearly screening should continue until the smoker has stopped smoking for at least 15 years. Yearly screening should be stopped for people who develop a   health problem that would prevent them from having lung cancer treatment.   If you are pregnant, do not drink alcohol. If you are breastfeeding, be very cautious about drinking alcohol. If you are not pregnant and choose to drink alcohol, do not have more than 1 drink per day. One drink is considered to be 12 ounces (355 mL) of beer, 5 ounces (148 mL) of wine, or 1.5 ounces (44 mL) of liquor.   Avoid use of street drugs. Do not share needles with anyone. Ask for help if you need support or instructions about stopping the use of  drugs.   High blood pressure causes heart disease and increases the risk of stroke. Your blood pressure should be checked at least yearly.  Ongoing high blood pressure should be treated with medicines if weight loss and exercise do not work.   If you are 69-55 years old, ask your health care provider if you should take aspirin to prevent strokes.   Diabetes screening involves taking a blood sample to check your fasting blood sugar level. This should be done once every 3 years, after age 38, if you are within normal weight and without risk factors for diabetes. Testing should be considered at a younger age or be carried out more frequently if you are overweight and have at least 1 risk factor for diabetes.   Breast cancer screening is essential preventive care for women. You should practice "breast self-awareness."  This means understanding the normal appearance and feel of your breasts and may include breast self-examination.  Any changes detected, no matter how small, should be reported to a health care provider.  Women in their 80s and 30s should have a clinical breast exam (CBE) by a health care provider as part of a regular health exam every 1 to 3 years.  After age 66, women should have a CBE every year.  Starting at age 1, women should consider having a mammogram (breast X-ray test) every year.  Women who have a family history of breast cancer should talk to their health care provider about genetic screening.  Women at a high risk of breast cancer should talk to their health care providers about having an MRI and a mammogram every year.   -Breast cancer gene (BRCA)-related cancer risk assessment is recommended for women who have family members with BRCA-related cancers. BRCA-related cancers include breast, ovarian, tubal, and peritoneal cancers. Having family members with these cancers may be associated with an increased risk for harmful changes (mutations) in the breast cancer genes BRCA1 and  BRCA2. Results of the assessment will determine the need for genetic counseling and BRCA1 and BRCA2 testing.   The Pap test is a screening test for cervical cancer. A Pap test can show cell changes on the cervix that might become cervical cancer if left untreated. A Pap test is a procedure in which cells are obtained and examined from the lower end of the uterus (cervix).   - Women should have a Pap test starting at age 57.   - Between ages 90 and 70, Pap tests should be repeated every 2 years.   - Beginning at age 63, you should have a Pap test every 3 years as long as the past 3 Pap tests have been normal.   - Some women have medical problems that increase the chance of getting cervical cancer. Talk to your health care provider about these problems. It is especially important to talk to your health care provider if a  new problem develops soon after your last Pap test. In these cases, your health care provider may recommend more frequent screening and Pap tests.   - The above recommendations are the same for women who have or have not gotten the vaccine for human papillomavirus (HPV).   - If you had a hysterectomy for a problem that was not cancer or a condition that could lead to cancer, then you no longer need Pap tests. Even if you no longer need a Pap test, a regular exam is a good idea to make sure no other problems are starting.   - If you are between ages 36 and 66 years, and you have had normal Pap tests going back 10 years, you no longer need Pap tests. Even if you no longer need a Pap test, a regular exam is a good idea to make sure no other problems are starting.   - If you have had past treatment for cervical cancer or a condition that could lead to cancer, you need Pap tests and screening for cancer for at least 20 years after your treatment.   - If Pap tests have been discontinued, risk factors (such as a new sexual partner) need to be reassessed to determine if screening should  be resumed.   - The HPV test is an additional test that may be used for cervical cancer screening. The HPV test looks for the virus that can cause the cell changes on the cervix. The cells collected during the Pap test can be tested for HPV. The HPV test could be used to screen women aged 70 years and older, and should be used in women of any age who have unclear Pap test results. After the age of 67, women should have HPV testing at the same frequency as a Pap test.   Colorectal cancer can be detected and often prevented. Most routine colorectal cancer screening begins at the age of 57 years and continues through age 26 years. However, your health care provider may recommend screening at an earlier age if you have risk factors for colon cancer. On a yearly basis, your health care provider may provide home test kits to check for hidden blood in the stool.  Use of a small camera at the end of a tube, to directly examine the colon (sigmoidoscopy or colonoscopy), can detect the earliest forms of colorectal cancer. Talk to your health care provider about this at age 23, when routine screening begins. Direct exam of the colon should be repeated every 5 -10 years through age 49 years, unless early forms of pre-cancerous polyps or small growths are found.   People who are at an increased risk for hepatitis B should be screened for this virus. You are considered at high risk for hepatitis B if:  -You were born in a country where hepatitis B occurs often. Talk with your health care provider about which countries are considered high risk.  - Your parents were born in a high-risk country and you have not received a shot to protect against hepatitis B (hepatitis B vaccine).  - You have HIV or AIDS.  - You use needles to inject street drugs.  - You live with, or have sex with, someone who has Hepatitis B.  - You get hemodialysis treatment.  - You take certain medicines for conditions like cancer, organ  transplantation, and autoimmune conditions.   Hepatitis C blood testing is recommended for all people born from [birth year] through 1965 and any individual  with known risks for hepatitis C.   Practice safe sex. Use condoms and avoid high-risk sexual practices to reduce the spread of sexually transmitted infections (STIs). STIs include gonorrhea, chlamydia, syphilis, trichomonas, herpes, HPV, and human immunodeficiency virus (HIV). Herpes, HIV, and HPV are viral illnesses that have no cure. They can result in disability, cancer, and death. Sexually active women aged 25 years and younger should be checked for chlamydia. Older women with new or multiple partners should also be tested for chlamydia. Testing for other STIs is recommended if you are sexually active and at increased risk.   Osteoporosis is a disease in which the bones lose minerals and strength with aging. This can result in serious bone fractures or breaks. The risk of osteoporosis can be identified using a bone density scan. Women ages 65 years and over and women at risk for fractures or osteoporosis should discuss screening with their health care providers. Ask your health care provider whether you should take a calcium supplement or vitamin D to There are also several preventive steps women can take to avoid osteoporosis and resulting fractures or to keep osteoporosis from worsening. -->Recommendations include:  Eat a balanced diet high in fruits, vegetables, calcium, and vitamins.  Get enough calcium. The recommended total intake of is 1,200 mg daily; for best absorption, if taking supplements, divide doses into 250-500 mg doses throughout the day. Of the two types of calcium, calcium carbonate is best absorbed when taken with food but calcium citrate can be taken on an empty stomach.  Get enough vitamin D. NAMS and the National Osteoporosis Foundation recommend at least 1,000 IU per day for women age 50 and over who are at risk of vitamin D  deficiency. Vitamin D deficiency can be caused by inadequate sun exposure (for example, those who live in northern latitudes).  Avoid alcohol and smoking. Heavy alcohol intake (more than 7 drinks per week) increases the risk of falls and hip fracture and women smokers tend to lose bone more rapidly and have lower bone mass than nonsmokers. Stopping smoking is one of the most important changes women can make to improve their health and decrease risk for disease.  Be physically active every day. Weight-bearing exercise (for example, fast walking, hiking, jogging, and weight training) may strengthen bones or slow the rate of bone loss that comes with aging. Balancing and muscle-strengthening exercises can reduce the risk of falling and fracture.  Consider therapeutic medications. Currently, several types of effective drugs are available. Healthcare providers can recommend the type most appropriate for each woman.  Eliminate environmental factors that may contribute to accidents. Falls cause nearly 90% of all osteoporotic fractures, so reducing this risk is an important bone-health strategy. Measures include ample lighting, removing obstructions to walking, using nonskid rugs on floors, and placing mats and/or grab bars in showers.  Be aware of medication side effects. Some common medicines make bones weaker. These include a type of steroid drug called glucocorticoids used for arthritis and asthma, some antiseizure drugs, certain sleeping pills, treatments for endometriosis, and some cancer drugs. An overactive thyroid gland or using too much thyroid hormone for an underactive thyroid can also be a problem. If you are taking these medicines, talk to your doctor about what you can do to help protect your bones.reduce the rate of osteoporosis.    Menopause can be associated with physical symptoms and risks. Hormone replacement therapy is available to decrease symptoms and risks. You should talk to your  health care provider   about whether hormone replacement therapy is right for you.   Use sunscreen. Apply sunscreen liberally and repeatedly throughout the day. You should seek shade when your shadow is shorter than you. Protect yourself by wearing long sleeves, pants, a wide-brimmed hat, and sunglasses year round, whenever you are outdoors.   Once a month, do a whole body skin exam, using a mirror to look at the skin on your back. Tell your health care provider of new moles, moles that have irregular borders, moles that are larger than a pencil eraser, or moles that have changed in shape or color.   -Stay current with required vaccines (immunizations).   Influenza vaccine. All adults should be immunized every year.  Tetanus, diphtheria, and acellular pertussis (Td, Tdap) vaccine. Pregnant women should receive 1 dose of Tdap vaccine during each pregnancy. The dose should be obtained regardless of the length of time since the last dose. Immunization is preferred during the 27th 36th week of gestation. An adult who has not previously received Tdap or who does not know her vaccine status should receive 1 dose of Tdap. This initial dose should be followed by tetanus and diphtheria toxoids (Td) booster doses every 10 years. Adults with an unknown or incomplete history of completing a 3-dose immunization series with Td-containing vaccines should begin or complete a primary immunization series including a Tdap dose. Adults should receive a Td booster every 10 years.  Varicella vaccine. An adult without evidence of immunity to varicella should receive 2 doses or a second dose if she has previously received 1 dose. Pregnant females who do not have evidence of immunity should receive the first dose after pregnancy. This first dose should be obtained before leaving the health care facility. The second dose should be obtained 4 8 weeks after the first dose.  Human papillomavirus (HPV) vaccine. Females aged 13 26  years who have not received the vaccine previously should obtain the 3-dose series. The vaccine is not recommended for use in pregnant females. However, pregnancy testing is not needed before receiving a dose. If a female is found to be pregnant after receiving a dose, no treatment is needed. In that case, the remaining doses should be delayed until after the pregnancy. Immunization is recommended for any person with an immunocompromised condition through the age of 26 years if she did not get any or all doses earlier. During the 3-dose series, the second dose should be obtained 4 8 weeks after the first dose. The third dose should be obtained 24 weeks after the first dose and 16 weeks after the second dose.  Zoster vaccine. One dose is recommended for adults aged 60 years or older unless certain conditions are present.  Measles, mumps, and rubella (MMR) vaccine. Adults born before 1957 generally are considered immune to measles and mumps. Adults born in 1957 or later should have 1 or more doses of MMR vaccine unless there is a contraindication to the vaccine or there is laboratory evidence of immunity to each of the three diseases. A routine second dose of MMR vaccine should be obtained at least 28 days after the first dose for students attending postsecondary schools, health care workers, or international travelers. People who received inactivated measles vaccine or an unknown type of measles vaccine during 1963 1967 should receive 2 doses of MMR vaccine. People who received inactivated mumps vaccine or an unknown type of mumps vaccine before 1979 and are at high risk for mumps infection should consider immunization with 2 doses of   MMR vaccine. For females of childbearing age, rubella immunity should be determined. If there is no evidence of immunity, females who are not pregnant should be vaccinated. If there is no evidence of immunity, females who are pregnant should delay immunization until after pregnancy.  Unvaccinated health care workers born before [birth year] who lack laboratory evidence of measles, mumps, or rubella immunity or laboratory confirmation of disease should consider measles and mumps immunization with 2 doses of MMR vaccine or rubella immunization with 1 dose of MMR vaccine.  Pneumococcal 13-valent conjugate (PCV13) vaccine. When indicated, a person who is uncertain of her immunization history and has no record of immunization should receive the PCV13 vaccine. An adult aged 54 years or older who has certain medical conditions and has not been previously immunized should receive 1 dose of PCV13 vaccine. This PCV13 should be followed with a dose of pneumococcal polysaccharide (PPSV23) vaccine. The PPSV23 vaccine dose should be obtained at least 8 weeks after the dose of PCV13 vaccine. An adult aged 58 years or older who has certain medical conditions and previously received 1 or more doses of PPSV23 vaccine should receive 1 dose of PCV13. The PCV13 vaccine dose should be obtained 1 or more years after the last PPSV23 vaccine dose.  Pneumococcal polysaccharide (PPSV23) vaccine. When PCV13 is also indicated, PCV13 should be obtained first. All adults aged 58 years and older should be immunized. An adult younger than age 65 years who has certain medical conditions should be immunized. Any person who resides in a nursing home or long-term care facility should be immunized. An adult smoker should be immunized. People with an immunocompromised condition and certain other conditions should receive both PCV13 and PPSV23 vaccines. People with human immunodeficiency virus (HIV) infection should be immunized as soon as possible after diagnosis. Immunization during chemotherapy or radiation therapy should be avoided. Routine use of PPSV23 vaccine is not recommended for American Indians, [place] Natives, or people younger than 65 years unless there are medical conditions that require PPSV23 vaccine. When indicated,  people who have unknown immunization and have no record of immunization should receive PPSV23 vaccine. One-time revaccination 5 years after the first dose of PPSV23 is recommended for people aged 70 64 years who have chronic kidney failure, nephrotic syndrome, asplenia, or immunocompromised conditions. People who received 1 2 doses of PPSV23 before age 32 years should receive another dose of PPSV23 vaccine at age 96 years or later if at least 5 years have passed since the previous dose. Doses of PPSV23 are not needed for people immunized with PPSV23 at or after age 55 years.  Meningococcal vaccine. Adults with asplenia or persistent complement component deficiencies should receive 2 doses of quadrivalent meningococcal conjugate (MenACWY-D) vaccine. The doses should be obtained at least 2 months apart. Microbiologists working with certain meningococcal bacteria, [employer] recruits, people at risk during an outbreak, and people who travel to or live in countries with a high rate of meningitis should be immunized. A first-year college student up through age 58 years who is living in a residence hall should receive a dose if she did not receive a dose on or after her 16th birthday. Adults who have certain high-risk conditions should receive one or more doses of vaccine.  Hepatitis A vaccine. Adults who wish to be protected from this disease, have certain high-risk conditions, work with hepatitis A-infected animals, work in hepatitis A research labs, or travel to or work in countries with a high rate of hepatitis A should be  immunized. Adults who were previously unvaccinated and who anticipate close contact with an international adoptee during the first 60 days after arrival in the [place] from a country with a high rate of hepatitis A should be immunized.  Hepatitis B vaccine.  Adults who wish to be protected from this disease, have certain high-risk conditions, may be exposed to blood or other infectious  body fluids, are household contacts or sex partners of hepatitis B positive people, are clients or workers in certain care facilities, or travel to or work in countries with a high rate of hepatitis B should be immunized.  Haemophilus influenzae type b (Hib) vaccine. A previously unvaccinated person with asplenia or sickle cell disease or having a scheduled splenectomy should receive 1 dose of Hib vaccine. Regardless of previous immunization, a recipient of a hematopoietic stem cell transplant should receive a 3-dose series 6 12 months after her successful transplant. Hib vaccine is not recommended for adults with HIV infection.  Preventive Services / Frequency Ages 6 to 39years  Blood pressure check.** / Every 1 to 2 years.  Lipid and cholesterol check.** / Every 5 years beginning at age 39.  Clinical breast exam.** / Every 3 years for women in their 61s and 62s.  BRCA-related cancer risk assessment.** / For women who have family members with a BRCA-related cancer (breast, ovarian, tubal, or peritoneal cancers).  Pap test.** / Every 2 years from ages 47 through 85. Every 3 years starting at age 34 through age 12 or 74 with a history of 3 consecutive normal Pap tests.  HPV screening.** / Every 3 years from ages 46 through ages 43 to 54 with a history of 3 consecutive normal Pap tests.  Hepatitis C blood test.** / For any individual with known risks for hepatitis C.  Skin self-exam. / Monthly.  Influenza vaccine. / Every year.  Tetanus, diphtheria, and acellular pertussis (Tdap, Td) vaccine.** / Consult your health care provider. Pregnant women should receive 1 dose of Tdap vaccine during each pregnancy. 1 dose of Td every 10 years.  Varicella vaccine.** / Consult your health care provider. Pregnant females who do not have evidence of immunity should receive the first dose after pregnancy.  HPV vaccine. / 3 doses over 6 months, if 64 and younger. The vaccine is not recommended for use in  pregnant females. However, pregnancy testing is not needed before receiving a dose.  Measles, mumps, rubella (MMR) vaccine.** / You need at least 1 dose of MMR if you were born in 1957 or later. You may also need a 2nd dose. For females of childbearing age, rubella immunity should be determined. If there is no evidence of immunity, females who are not pregnant should be vaccinated. If there is no evidence of immunity, females who are pregnant should delay immunization until after pregnancy.  Pneumococcal 13-valent conjugate (PCV13) vaccine.** / Consult your health care provider.  Pneumococcal polysaccharide (PPSV23) vaccine.** / 1 to 2 doses if you smoke cigarettes or if you have certain conditions.  Meningococcal vaccine.** / 1 dose if you are age 71 to 37 years and a Market researcher living in a residence hall, or have one of several medical conditions, you need to get vaccinated against meningococcal disease. You may also need additional booster doses.  Hepatitis A vaccine.** / Consult your health care provider.  Hepatitis B vaccine.** / Consult your health care provider.  Haemophilus influenzae type b (Hib) vaccine.** / Consult your health care provider.  Ages 55 to 64years  Blood pressure check.** / Every 1 to 2 years.  Lipid and cholesterol check.** / Every 5 years beginning at age 20 years.  Lung cancer screening. / Every year if you are aged 55 80 years and have a 30-pack-year history of smoking and currently smoke or have quit within the past 15 years. Yearly screening is stopped once you have quit smoking for at least 15 years or develop a health problem that would prevent you from having lung cancer treatment.  Clinical breast exam.** / Every year after age 40 years.  BRCA-related cancer risk assessment.** / For women who have family members with a BRCA-related cancer (breast, ovarian, tubal, or peritoneal cancers).  Mammogram.** / Every year beginning at age 40  years and continuing for as long as you are in good health. Consult with your health care provider.  Pap test.** / Every 3 years starting at age 30 years through age 65 or 70 years with a history of 3 consecutive normal Pap tests.  HPV screening.** / Every 3 years from ages 30 years through ages 65 to 70 years with a history of 3 consecutive normal Pap tests.  Fecal occult blood test (FOBT) of stool. / Every year beginning at age 50 years and continuing until age 75 years. You may not need to do this test if you get a colonoscopy every 10 years.  Flexible sigmoidoscopy or colonoscopy.** / Every 5 years for a flexible sigmoidoscopy or every 10 years for a colonoscopy beginning at age 50 years and continuing until age 75 years.  Hepatitis C blood test.** / For all people born from 1945 through 1965 and any individual with known risks for hepatitis C.  Skin self-exam. / Monthly.  Influenza vaccine. / Every year.  Tetanus, diphtheria, and acellular pertussis (Tdap/Td) vaccine.** / Consult your health care provider. Pregnant women should receive 1 dose of Tdap vaccine during each pregnancy. 1 dose of Td every 10 years.  Varicella vaccine.** / Consult your health care provider. Pregnant females who do not have evidence of immunity should receive the first dose after pregnancy.  Zoster vaccine.** / 1 dose for adults aged 60 years or older.  Measles, mumps, rubella (MMR) vaccine.** / You need at least 1 dose of MMR if you were born in 1957 or later. You may also need a 2nd dose. For females of childbearing age, rubella immunity should be determined. If there is no evidence of immunity, females who are not pregnant should be vaccinated. If there is no evidence of immunity, females who are pregnant should delay immunization until after pregnancy.  Pneumococcal 13-valent conjugate (PCV13) vaccine.** / Consult your health care provider.  Pneumococcal polysaccharide (PPSV23) vaccine.** / 1 to 2 doses if  you smoke cigarettes or if you have certain conditions.  Meningococcal vaccine.** / Consult your health care provider.  Hepatitis A vaccine.** / Consult your health care provider.  Hepatitis B vaccine.** / Consult your health care provider.  Haemophilus influenzae type b (Hib) vaccine.** / Consult your health care provider.  Ages 65 years and over  Blood pressure check.** / Every 1 to 2 years.  Lipid and cholesterol check.** / Every 5 years beginning at age 20 years.  Lung cancer screening. / Every year if you are aged 55 80 years and have a 30-pack-year history of smoking and currently smoke or have quit within the past 15 years. Yearly screening is stopped once you have quit smoking for at least 15 years or develop a health problem that   would prevent you from having lung cancer treatment.  Clinical breast exam.** / Every year after age 103 years.  BRCA-related cancer risk assessment.** / For women who have family members with a BRCA-related cancer (breast, ovarian, tubal, or peritoneal cancers).  Mammogram.** / Every year beginning at age 36 years and continuing for as long as you are in good health. Consult with your health care provider.  Pap test.** / Every 3 years starting at age 5 years through age 85 or 10 years with 3 consecutive normal Pap tests. Testing can be stopped between 65 and 70 years with 3 consecutive normal Pap tests and no abnormal Pap or HPV tests in the past 10 years.  HPV screening.** / Every 3 years from ages 93 years through ages 70 or 45 years with a history of 3 consecutive normal Pap tests. Testing can be stopped between 65 and 70 years with 3 consecutive normal Pap tests and no abnormal Pap or HPV tests in the past 10 years.  Fecal occult blood test (FOBT) of stool. / Every year beginning at age 8 years and continuing until age 45 years. You may not need to do this test if you get a colonoscopy every 10 years.  Flexible sigmoidoscopy or colonoscopy.** /  Every 5 years for a flexible sigmoidoscopy or every 10 years for a colonoscopy beginning at age 69 years and continuing until age 68 years.  Hepatitis C blood test.** / For all people born from [birth year] through 1963 and any individual with known risks for hepatitis C.  Osteoporosis screening.** / A one-time screening for women ages 7 years and over and women at risk for fractures or osteoporosis.  Skin self-exam. / Monthly.  Influenza vaccine. / Every year.  Tetanus, diphtheria, and acellular pertussis (Tdap/Td) vaccine.** / 1 dose of Td every 10 years.  Varicella vaccine.** / Consult your health care provider.  Zoster vaccine.** / 1 dose for adults aged 5 years or older.  Pneumococcal 13-valent conjugate (PCV13) vaccine.** / Consult your health care provider.  Pneumococcal polysaccharide (PPSV23) vaccine.** / 1 dose for all adults aged 74 years and older.  Meningococcal vaccine.** / Consult your health care provider.  Hepatitis A vaccine.** / Consult your health care provider.  Hepatitis B vaccine.** / Consult your health care provider.  Haemophilus influenzae type b (Hib) vaccine.** / Consult your health care provider. ** Family history and personal history of risk and conditions may change your health care provider's recommendations. Document Released: 02/05/2002 Document Revised: 09/30/2013  [facility] Patient Information 2014 [place], [place].   EXERCISE AND DIET:  We recommended that you start or continue a regular exercise program for good health. Regular exercise means any activity that makes your heart beat faster and makes you sweat.  We recommend exercising at least 30 minutes per day at least 3 days a week, preferably 5.  We also recommend a diet low in fat and sugar / carbohydrates.  Inactivity, poor dietary choices and obesity can cause diabetes, heart attack, stroke, and kidney damage, among others.     ALCOHOL AND SMOKING:  Women should limit their alcohol intake to no  more than 7 drinks/beers/glasses of wine (combined, not each!) per week. Moderation of alcohol intake to this level decreases your risk of breast cancer and liver damage.  ( And of course, no recreational drugs are part of a healthy lifestyle.)  Also, you should not be smoking at all or even being exposed to second hand smoke. Most people know smoking can  cause cancer, and various heart and lung diseases, but did you know it also contributes to weakening of your bones?  Aging of your skin?  Yellowing of your teeth and nails?   CALCIUM AND VITAMIN D:  Adequate intake of calcium and Vitamin D are recommended.  The recommendations for exact amounts of these supplements seem to change often, but generally speaking 600 mg of calcium (either carbonate or citrate) and 800 units of Vitamin D per day seems prudent. Certain women may benefit from higher intake of Vitamin D.  If you are among these women, your doctor will have told you during your visit.     PAP SMEARS:  Pap smears, to check for cervical cancer or precancers,  have traditionally been done yearly, although recent scientific advances have shown that most women can have pap smears less often.  However, every woman still should have a physical exam from her gynecologist or primary care physician every year. It will include a breast check, inspection of the vulva and vagina to check for abnormal growths or skin changes, a visual exam of the cervix, and then an exam to evaluate the size and shape of the uterus and ovaries.  And after 46 years of age, a rectal exam is indicated to check for rectal cancers. We will also provide age appropriate advice regarding health maintenance, like when you should have certain vaccines, screening for sexually transmitted diseases, bone density testing, colonoscopy, mammograms, etc.    MAMMOGRAMS:  All women over 37 years old should have a yearly mammogram. Many facilities now offer a "3D" mammogram, which may cost  around $50 extra out of pocket. If possible,  we recommend you accept the option to have the 3D mammogram performed.  It both reduces the number of women who will be called back for extra views which then turn out to be normal, and it is better than the routine mammogram at detecting truly abnormal areas.     COLONOSCOPY:  Colonoscopy to screen for colon cancer is recommended for all women at age 49.  We know, you hate the idea of the prep.  We agree, BUT, having colon cancer and not knowing it is worse!!  Colon cancer so often starts as a polyp that can be seen and removed at colonscopy, which can quite literally save your life!  And if your first colonoscopy is normal and you have no family history of colon cancer, most women don't have to have it again for 10 years.  Once every ten years, you can do something that may end up saving your life, right?  We will be happy to help you get it scheduled when you are ready.  Be sure to check your insurance coverage so you understand how much it will cost.  It may be covered as a preventative service at no cost, but you should check your particular policy.    Please continue all medications as directed. Continue to drink plenty of water and follow diabetic diet consistently. To treat bronchitis- please take Azithromycin and Tessalon as directed. Once you are feeling better, increase regular exercise. Good luck with merger at work. Follow-up here in 6 months. GREAT TO SEE YOU!

## 2019-02-16 NOTE — Assessment & Plan Note (Signed)
  Please continue all medications as directed. Continue to drink plenty of water and follow diabetic diet consistently. To treat bronchitis- please take Azithromycin and Tessalon as directed. Once you are feeling better, increase regular exercise. Good luck with merger at work. Follow-up here in 6 months.

## 2019-03-06 ENCOUNTER — Other Ambulatory Visit: Payer: Self-pay | Admitting: Adult Health

## 2019-03-06 ENCOUNTER — Encounter: Payer: Self-pay | Admitting: Adult Health

## 2019-03-09 ENCOUNTER — Other Ambulatory Visit: Payer: Self-pay

## 2019-03-09 DIAGNOSIS — K219 Gastro-esophageal reflux disease without esophagitis: Secondary | ICD-10-CM

## 2019-03-09 NOTE — Progress Notes (Signed)
Sure guess it can't hurt to find out why this doesn't go away     ----- Message -----  From: Esaw Grandchild, NP  Sent: 03/09/19, 2:44 PM  To: Cynthia Moody  Subject: RE: Non-Urgent Medical Question    The cough is likely GERD related, best to be evaluated by Gastroenterologist.  Can I put in referral for you?  Sincerely,  Valetta Fuller       ----- Message -----   From: Cynthia Moody   Sent: 03/09/2019 12:57 PM EDT    To: Esaw Grandchild, NP  Subject: RE: Non-Urgent Medical Question    I have been prescribed 40mg  of the omneprezole however due to my anemia I have to reduce that because it interferes with iron absorption. So I can either take more of the omneprezole or take more iron infusions because the iron supplement isn't enough or getting absorbed appropriately to keep me at normal levels. I have not been seen by a gastroenterologist     ----- Message -----  From: Esaw Grandchild, NP  Sent: 03/09/19, 12:35 PM  To: Cynthia Moody  Subject: RE: Non-Urgent Medical Question    Good Afternoon Ms. Logsdon,  Perhaps it could be increase in Acid Reflux symptoms.  Have you been evaluated by Gastroenterology?  How long have you been on Omeprazole 20mg ?  Sincerely,  Valetta Fuller       ----- Message -----   From: Cynthia Moody   Sent: 03/06/2019 1:47 PM EDT    To: Esaw Grandchild, NP  Subject: RE: Non-Urgent Medical Question    refill of cough gels.     These cough pills worked some but I still have this cough and I took the last one yesterday. i don't have any additional symptoms, it just wont go away. Its like a irritation in the back of my throat that wont clear, some times I get phlegm, but most often nothing comes up. I sound like a chain smoker. The antibiotic didn't seem to do much. i feel like its a ongoing post nasal drip/mucus going down my throat but none of the allergy meds help   Referral to GI placed.  Charyl Bigger, CMA

## 2019-03-16 DIAGNOSIS — E109 Type 1 diabetes mellitus without complications: Secondary | ICD-10-CM | POA: Diagnosis not present

## 2019-03-16 DIAGNOSIS — Z794 Long term (current) use of insulin: Secondary | ICD-10-CM | POA: Diagnosis not present

## 2019-03-16 DIAGNOSIS — E1042 Type 1 diabetes mellitus with diabetic polyneuropathy: Secondary | ICD-10-CM | POA: Diagnosis not present

## 2019-03-16 DIAGNOSIS — E108 Type 1 diabetes mellitus with unspecified complications: Secondary | ICD-10-CM | POA: Diagnosis not present

## 2019-03-27 ENCOUNTER — Encounter: Payer: Self-pay | Admitting: Adult Health

## 2019-04-14 ENCOUNTER — Other Ambulatory Visit: Payer: Self-pay

## 2019-04-15 ENCOUNTER — Other Ambulatory Visit: Payer: Self-pay

## 2019-04-15 ENCOUNTER — Ambulatory Visit (INDEPENDENT_AMBULATORY_CARE_PROVIDER_SITE_OTHER): Payer: 59 | Admitting: Gastroenterology

## 2019-04-15 ENCOUNTER — Encounter: Payer: Self-pay | Admitting: Gastroenterology

## 2019-04-15 VITALS — Ht 66.0 in | Wt 213.0 lb

## 2019-04-15 DIAGNOSIS — R0982 Postnasal drip: Secondary | ICD-10-CM

## 2019-04-15 DIAGNOSIS — R05 Cough: Secondary | ICD-10-CM

## 2019-04-15 DIAGNOSIS — K219 Gastro-esophageal reflux disease without esophagitis: Secondary | ICD-10-CM | POA: Diagnosis not present

## 2019-04-15 DIAGNOSIS — R0989 Other specified symptoms and signs involving the circulatory and respiratory systems: Secondary | ICD-10-CM | POA: Diagnosis not present

## 2019-04-15 DIAGNOSIS — R053 Chronic cough: Secondary | ICD-10-CM

## 2019-04-15 MED ORDER — FEXOFENADINE HCL 180 MG PO TABS: 180.0000 mg | ORAL_TABLET | Freq: Every day | ORAL | 1 refills | Status: DC

## 2019-04-15 MED ORDER — IPRATROPIUM BROMIDE 0.03 % NA SOLN: 2.0000 | Freq: Three times a day (TID) | NASAL | 3 refills | Status: DC | PRN

## 2019-04-15 NOTE — Patient Instructions (Addendum)
If you are age 46 or older, your body mass index should be between 23-30. Your Body mass index is 34.38 kg/m. If this is out of the aforementioned range listed, please consider follow up with your Primary Care Provider.  If you are age 32 or younger, your body mass index should be between 19-25. Your Body mass index is 34.38 kg/m. If this is out of the aformentioned range listed, please consider follow up with your Primary Care Provider.    To help prevent the possible spread of infection to our patients, communities, and staff; we will be implementing the following measures:  As of now we are not allowing any visitors/family members to accompany you to any upcoming appointments with [facility]. If you have any concerns about this please contact our office to discuss prior to the appointment.    We have sent the following medications to your pharmacy for you to pick up at your convenience:: fexofenadine (ALLEGRA) 180 MG tablet: Take once a day ipratropium (ATROVENT) 0.03 % nasal spray: 2 sprays in each nostril every  8 hours as needed  Continue Omeprazole 20mg : Take twice a day  Thank you for entrusting me with your care and for choosing [employer], Dr. [place] Cellar

## 2019-04-15 NOTE — Progress Notes (Signed)
Virtual Visit via Video Note  I connected with Cynthia Moody on 04/15/19 at  8:30 AM EDT by a video enabled telemedicine application and verified that I am speaking with the correct person using two identifiers.   I discussed the limitations of evaluation and management by telemedicine and the availability of in person appointments. The patient expressed understanding and agreed to proceed.  THIS ENCOUNTER IS A VIRTUAL VISIT DUE TO COVID-19 - PATIENT WAS NOT SEEN IN THE OFFICE. PATIENT HAS CONSENTED TO VIRTUAL VISIT / TELEMEDICINE VISIT VIA [employer] APP   Location of patient: home Location of provider: office Name of referring provider:  Mina Marble Persons participating: myself, patient   HPI :  48 y/o female with a history of diabetes, GERD, cough referred here by Mina Marble NP for cough and its possible relationship to GERD.    She has had a persistent cough ongoing for several months, since October at least. She endorses a dry cough, no production. She feels a sense of globus, something irritating in her throat, cannot get it up or out of her throat. She has a history of reflux,, has been on omeprazole, previously at 20mg  once daily, now at 20mg  BID. She has been on this regimen for a few years, but she reports longstanding use of antacids in general for at least 15 years. She thinks most of her reflux symptoms appear controlled on this regimen for the most. Symptoms typically pyrosis and regurgitation, this has not been bothering her much lately. She does not have dysphagia at baseline with her meals. No nausea or vomiting. She had been on Giardiance for a period of time, she came off it, did not help. She reports problems with allergies now, sneezing and rhinorrhea. She has been using sudafed, zyrtec, allegra PRN but not routinely. She has not taken antihistamines routinely. She has tried flonase which has not helped too much. She continues to feel post nasal drip and rhinorrhea which  bothers her. She denies any history of asthma.  She has tried OTC cough remedies which does not help. No tobacco use  She had an EGD in 2011 in [place]. No records available - she thought it looked okay, initially was done for anemia evaluation. Colonoscopy at the time did not have anything to cause anemia. She takes iron supplementation. She has an IUD in place to prevent menses, history of menorrhagia, thought to be the cause of her prior anemia. No recent labs as below show no anemia.  No FH of GI tract malignancies   Past Medical History:  Diagnosis Date  . Acne   . Anemia   . Cough 01/27/2016  . Depression   . Diabetes mellitus type 1 ([place])   . Diabetic neuropathy ([facility])    feet  . Diabetic retinopathy ([place])    bilateral  . Dyslipidemia   . GERD (gastroesophageal reflux disease)   . Infertility, female   . Post-nasal drip 01/27/2016  . Shoulder pain, bilateral    wakes up frequently due to pain  . Stuffy nose 01/27/2016  . Trigger finger of right hand 01/2016   index finger     Past Surgical History:  Procedure Laterality Date  . CESAREAN SECTION  01/17/1995  . INTRAUTERINE DEVICE (IUD) INSERTION  06/2017   Mirena   . SHOULDER SURGERY Left 06/29/2013   exc. bone spur  . TRIGGER FINGER RELEASE Right 02/02/2016   Procedure: RIGHT INDEX RELEASE TRIGGER  ;  Surgeon: Leanora Cover, MD;  Location: [facility];  Service: Orthopedics;  Laterality: Right;   Family History  Problem Relation Age of Onset  . Diabetes Mother   . Heart attack Mother        Age 47  . Hypertension Mother   . Heart attack Father        Age 36  . Hyperlipidemia Father   . Diabetes Maternal Uncle   . Diabetes Maternal Grandmother   . Colon cancer Neg Hx   . Esophageal cancer Neg Hx   . Stomach cancer Neg Hx    Social History   Tobacco Use  . Smoking status: Never Smoker  . Smokeless tobacco: Never Used  Substance Use Topics  . Alcohol use: Yes    Comment: occasionally  . Drug use: No    Current Outpatient Medications  Medication Sig Dispense Refill  . acetaminophen (TYLENOL) 500 MG tablet Take 1,000 mg by mouth every 6 (six) hours as needed.    [name] Kitchen b complex vitamins tablet Take 1 tablet by mouth daily.    . cholecalciferol (VITAMIN D) 1000 UNITS tablet Take 1,000 Units by mouth daily.    . Continuous Blood Gluc Sensor (FREESTYLE LIBRE 14 DAY SENSOR) MISC USE 1 UNITS EVERY 14 DAYS. TO CHECK BLLOD SUGAR CHENGE THE UNIT EVERY 14 DAYS  11  . Dextromethorphan HBr (ROBITUSSIN LINGERING COUGHGELS) 15 MG CAPS Take by mouth daily as needed.    . Ferrous Sulfate Dried (SLOW RELEASE IRON) 45 MG TBCR Take 45 mg by mouth daily.     [name] Kitchen ibuprofen (ADVIL) 200 MG tablet Take 400 mg by mouth every 6 (six) hours as needed.    . Insulin Human (INSULIN PUMP) SOLN Inject into the skin continuous. Humalog    . Insulin Infusion Pump Supplies (AUTOSOFT 90 INFUSION SET) MISC CHANGE EVERY 2-3 DAYS    . Insulin Infusion Pump Supplies (PARADIGM QUICK-SET 32" 6MM) MISC 1 Device by Does not apply route every 3 (three) days.    . Insulin Infusion Pump Supplies (PARADIGM RESERVOIR 3ML) MISC 1 Device by Does not apply route every 3 (three) days.    . insulin lispro (HUMALOG) 100 UNIT/ML injection VIA INSULIN PUMP, total of 90 units/day 90 mL 3  . levonorgestrel (MIRENA) 20 MCG/24HR IUD 1 each by Intrauterine route once.    [name] Kitchen omeprazole (PRILOSEC) 20 MG capsule TAKE 1 CAPSULE BY MOUTH  DAILY (Patient taking differently: Take 20 mg by mouth 2 (two) times daily before a meal. ) 90 capsule 2  . Pediatric Multivit-Minerals-C (KIDS GUMMY BEAR VITAMINS PO) Take 2 each by mouth daily.     No current facility-administered medications for this visit.    Facility-Administered Medications Ordered in Other Visits  Medication Dose Route Frequency Provider Last Rate Last Dose  . heparin lock flush 100 unit/mL  500 Units Intracatheter Once PRN Brunetta Genera, MD      . sodium chloride 0.9 % injection 10 mL  10 mL  Intracatheter PRN Brunetta Genera, MD       Allergies  Allergen Reactions  . Statins Anaphylaxis    Leg cramps  . Lipitor [Atorvastatin] Swelling    LEGS  . Jardiance [Empagliflozin]   . Simvastatin Other (See Comments)    Severe leg cramps     Review of Systems: All systems reviewed and negative except where noted in HPI.   Lab Results  Component Value Date   WBC 7.5 01/27/2019   HGB 13.7 01/27/2019   HCT 39.9 01/27/2019  MCV 89 01/27/2019   PLT 369 01/27/2019    Lab Results  Component Value Date   CREATININE 0.97 01/27/2019   BUN 12 01/27/2019   NA 139 01/27/2019   K 4.5 01/27/2019   CL 99 01/27/2019   CO2 24 01/27/2019   Lab Results  Component Value Date   ALT 10 01/27/2019   AST 13 01/27/2019   ALKPHOS 112 01/27/2019   BILITOT 0.2 01/27/2019     Physical Exam: There were no vitals taken for this visit.  NA   ASSESSMENT AND PLAN:  48 y/o female here for reassessment of the following:  Chronic cough / globus / postnasal drip / GERD - she does have a history of reflux which has historically manifested as pyrosis. Her pyrosis seems relatively well controlled on her current regimen of omeprazole. Cough and globus appear ongoing for months, during which time she endorses postnasal drip. While it is possible GERD is contributing to her cough, given her typical reflux symptoms seem pretty well controlled, I'm suspicious her cough may more likely be due post-nasal drip / allergies. Recommend she take allegra 180mg  every day for a period of time to see if that helps, and also offered her some atrovent nasal spray to use a few times per day to treat the rhinorrhea. It will be helpful to see if controlling her rhinorrhea stops her cough. Otherwise, she should continue her PPI as presently dosed twice daily. If she fails to improve and her symptoms persist, may consider a referral to see allergy and also an endoscopy to rule out EoE, assess for esophagitis. I asked  her contact us in a few weeks and let us know how she is doing. Would also consider PFTs to rule out reactive airway disease if she has not had that yet. She agreed with the plan.   [place] Cellar, MD [facility]  CC: Esaw Grandchild, NP

## 2019-04-15 NOTE — Progress Notes (Signed)
Prescreen pt for visit

## 2019-05-06 ENCOUNTER — Telehealth: Payer: Self-pay

## 2019-05-06 ENCOUNTER — Other Ambulatory Visit: Payer: Self-pay

## 2019-05-06 MED ORDER — OMEPRAZOLE 20 MG PO CPDR: 20.0000 mg | DELAYED_RELEASE_CAPSULE | Freq: Two times a day (BID) | ORAL | 3 refills | Status: DC

## 2019-05-06 NOTE — Progress Notes (Signed)
omepra

## 2019-05-06 NOTE — Telephone Encounter (Signed)
Omeprazole refill sent to pharmacy. Patient called and notified.

## 2019-06-08 ENCOUNTER — Ambulatory Visit: Payer: 59 | Admitting: Obstetrics and Gynecology

## 2019-07-08 ENCOUNTER — Other Ambulatory Visit: Payer: Self-pay | Admitting: Gastroenterology

## 2019-07-30 ENCOUNTER — Ambulatory Visit: Payer: Self-pay | Admitting: Obstetrics and Gynecology

## 2019-07-30 ENCOUNTER — Other Ambulatory Visit: Payer: Self-pay | Admitting: Gastroenterology

## 2019-08-05 ENCOUNTER — Other Ambulatory Visit: Payer: Self-pay | Admitting: Gastroenterology

## 2019-08-25 ENCOUNTER — Other Ambulatory Visit: Payer: Self-pay | Admitting: Gastroenterology

## 2019-08-26 ENCOUNTER — Other Ambulatory Visit: Payer: Self-pay

## 2019-08-26 MED ORDER — IPRATROPIUM BROMIDE 0.03 % NA SOLN: 2.0000 | Freq: Three times a day (TID) | NASAL | 1 refills | Status: DC | PRN

## 2019-08-26 NOTE — Progress Notes (Signed)
Pt requested 90 day script for insurance purposes

## 2019-08-28 DIAGNOSIS — E6609 Other obesity due to excess calories: Secondary | ICD-10-CM | POA: Diagnosis not present

## 2019-08-28 DIAGNOSIS — E785 Hyperlipidemia, unspecified: Secondary | ICD-10-CM | POA: Diagnosis not present

## 2019-08-28 DIAGNOSIS — Z794 Long term (current) use of insulin: Secondary | ICD-10-CM | POA: Diagnosis not present

## 2019-08-28 DIAGNOSIS — E1042 Type 1 diabetes mellitus with diabetic polyneuropathy: Secondary | ICD-10-CM | POA: Diagnosis not present

## 2019-09-10 ENCOUNTER — Encounter: Payer: Self-pay | Admitting: Adult Health

## 2019-09-14 ENCOUNTER — Other Ambulatory Visit: Payer: Self-pay

## 2019-09-14 DIAGNOSIS — Z0000 Encounter for general adult medical examination without abnormal findings: Secondary | ICD-10-CM

## 2019-09-14 DIAGNOSIS — E1069 Type 1 diabetes mellitus with other specified complication: Secondary | ICD-10-CM

## 2019-09-14 DIAGNOSIS — E785 Hyperlipidemia, unspecified: Secondary | ICD-10-CM

## 2019-09-14 DIAGNOSIS — D509 Iron deficiency anemia, unspecified: Secondary | ICD-10-CM

## 2019-09-14 DIAGNOSIS — D75839 Thrombocytosis, unspecified: Secondary | ICD-10-CM

## 2019-09-14 DIAGNOSIS — D473 Essential (hemorrhagic) thrombocythemia: Secondary | ICD-10-CM

## 2019-09-15 ENCOUNTER — Other Ambulatory Visit: Payer: Self-pay

## 2019-09-15 ENCOUNTER — Other Ambulatory Visit (INDEPENDENT_AMBULATORY_CARE_PROVIDER_SITE_OTHER): Payer: 59

## 2019-09-15 DIAGNOSIS — Z0000 Encounter for general adult medical examination without abnormal findings: Secondary | ICD-10-CM | POA: Diagnosis not present

## 2019-09-15 DIAGNOSIS — D509 Iron deficiency anemia, unspecified: Secondary | ICD-10-CM | POA: Diagnosis not present

## 2019-09-15 DIAGNOSIS — E1069 Type 1 diabetes mellitus with other specified complication: Secondary | ICD-10-CM

## 2019-09-15 DIAGNOSIS — D473 Essential (hemorrhagic) thrombocythemia: Secondary | ICD-10-CM

## 2019-09-15 DIAGNOSIS — E785 Hyperlipidemia, unspecified: Secondary | ICD-10-CM

## 2019-09-15 DIAGNOSIS — D75839 Thrombocytosis, unspecified: Secondary | ICD-10-CM

## 2019-09-16 ENCOUNTER — Encounter: Payer: Self-pay | Admitting: Adult Health

## 2019-09-16 LAB — HEMOGLOBIN A1C

## 2019-09-19 LAB — CBC WITH DIFFERENTIAL/PLATELET
Hematocrit: 43.7 % (ref 34.0–46.6)
Hemoglobin: 14.1 g/dL (ref 11.1–15.9)
MCH: 29.5 pg (ref 26.6–33.0)
MCHC: 32.3 g/dL (ref 31.5–35.7)
MCV: 91 fL (ref 79–97)
Platelets: 394 10*3/uL (ref 150–450)
RBC: 4.78 x10E6/uL (ref 3.77–5.28)
RDW: 11.7 % (ref 11.7–15.4)
WBC: 7.8 10*3/uL (ref 3.4–10.8)

## 2019-09-19 LAB — COMPREHENSIVE METABOLIC PANEL
ALT: 13 IU/L (ref 0–32)
AST: 16 IU/L (ref 0–40)
Albumin/Globulin Ratio: 1.9 (ref 1.2–2.2)
Albumin: 4.6 g/dL (ref 3.8–4.8)
Alkaline Phosphatase: 121 IU/L — ABNORMAL HIGH (ref 39–117)
BUN/Creatinine Ratio: 7 — ABNORMAL LOW (ref 9–23)
BUN: 7 mg/dL (ref 6–24)
Bilirubin Total: 0.3 mg/dL (ref 0.0–1.2)
CO2: 25 mmol/L (ref 20–29)
Calcium: 9.6 mg/dL (ref 8.7–10.2)
Chloride: 102 mmol/L (ref 96–106)
Creatinine, Ser: 0.96 mg/dL (ref 0.57–1.00)
GFR calc Af Amer: 82 mL/min/{1.73_m2} (ref >59)
GFR calc non Af Amer: 71 mL/min/{1.73_m2} (ref >59)
Globulin, Total: 2.4 g/dL (ref 1.5–4.5)
Glucose: 130 mg/dL — ABNORMAL HIGH (ref 65–99)
Potassium: 4.6 mmol/L (ref 3.5–5.2)
Sodium: 141 mmol/L (ref 134–144)
Total Protein: 7 g/dL (ref 6.0–8.5)

## 2019-09-19 LAB — LIPID PANEL
Chol/HDL Ratio: 4.4 ratio (ref 0.0–4.4)
Cholesterol, Total: 170 mg/dL (ref 100–199)
HDL: 39 mg/dL — ABNORMAL LOW (ref >39)
LDL Chol Calc (NIH): 110 mg/dL — ABNORMAL HIGH (ref 0–99)
Triglycerides: 116 mg/dL (ref 0–149)
VLDL Cholesterol Cal: 21 mg/dL (ref 5–40)

## 2019-09-19 LAB — TSH: TSH: 0.677 u[IU]/mL (ref 0.450–4.500)

## 2019-09-20 ENCOUNTER — Encounter: Payer: Self-pay | Admitting: Adult Health

## 2019-10-06 ENCOUNTER — Other Ambulatory Visit: Payer: Self-pay | Admitting: Gastroenterology

## 2019-10-19 DIAGNOSIS — E103412 Type 1 diabetes mellitus with severe nonproliferative diabetic retinopathy with macular edema, left eye: Secondary | ICD-10-CM | POA: Diagnosis not present

## 2019-10-19 DIAGNOSIS — E103311 Type 1 diabetes mellitus with moderate nonproliferative diabetic retinopathy with macular edema, right eye: Secondary | ICD-10-CM | POA: Diagnosis not present

## 2019-11-30 DIAGNOSIS — E113512 Type 2 diabetes mellitus with proliferative diabetic retinopathy with macular edema, left eye: Secondary | ICD-10-CM | POA: Diagnosis not present

## 2019-12-08 DIAGNOSIS — E113511 Type 2 diabetes mellitus with proliferative diabetic retinopathy with macular edema, right eye: Secondary | ICD-10-CM | POA: Diagnosis not present

## 2019-12-15 DIAGNOSIS — E113512 Type 2 diabetes mellitus with proliferative diabetic retinopathy with macular edema, left eye: Secondary | ICD-10-CM | POA: Diagnosis not present

## 2019-12-22 DIAGNOSIS — E113511 Type 2 diabetes mellitus with proliferative diabetic retinopathy with macular edema, right eye: Secondary | ICD-10-CM | POA: Diagnosis not present

## 2020-01-08 IMAGING — US US ABDOMEN LIMITED
1 series · 14 of 25 positions shown · non-contrast
Comparison: None.

CLINICAL DATA: 47-year-old female with increased bilirubin.

EXAM:
ULTRASOUND ABDOMEN LIMITED RIGHT UPPER QUADRANT

[Series 1: us abdomen limited · 0.23mm/px · 14 of 47 slices shown]
[im 1/47]
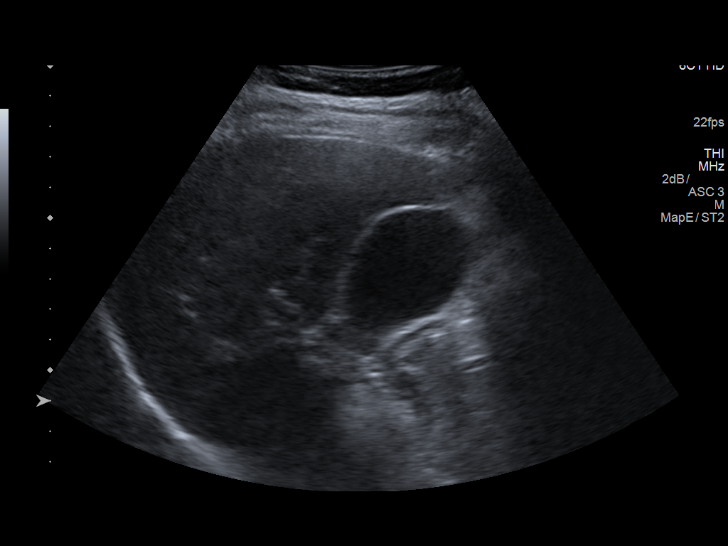
[im 4/47]
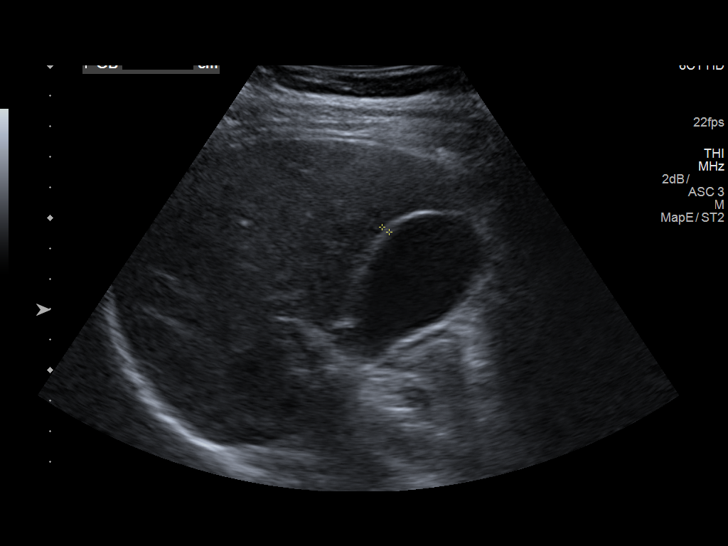
[im 8/47]
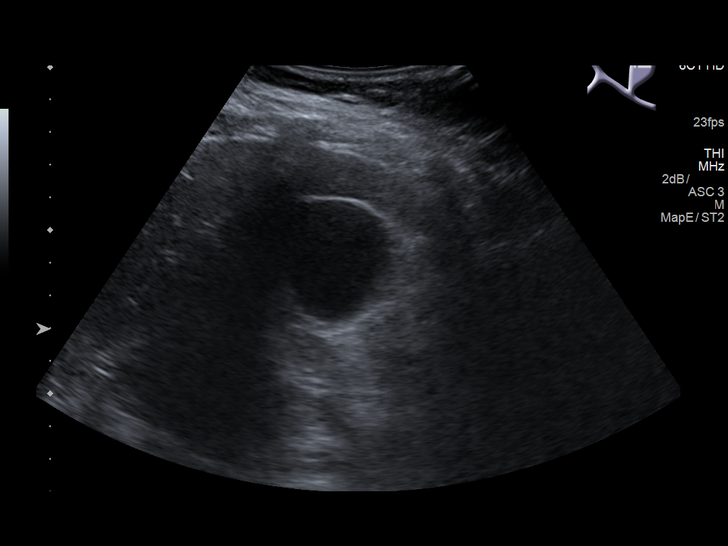
[im 12/47]
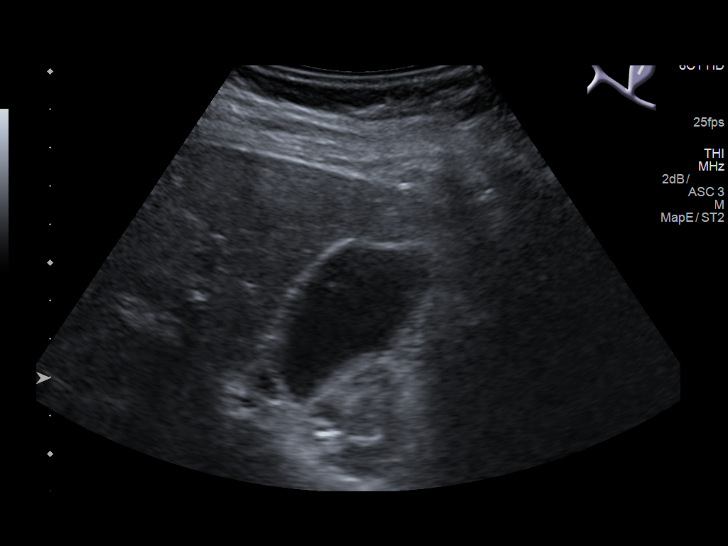
[im 16/47]
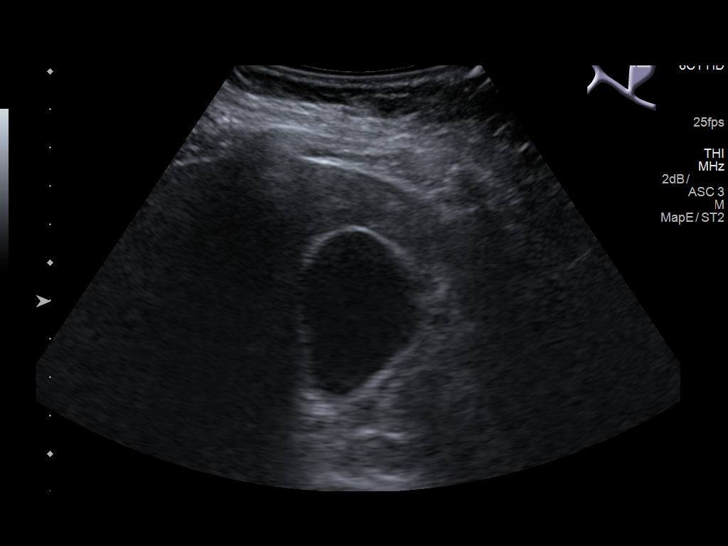
[im 18/47]
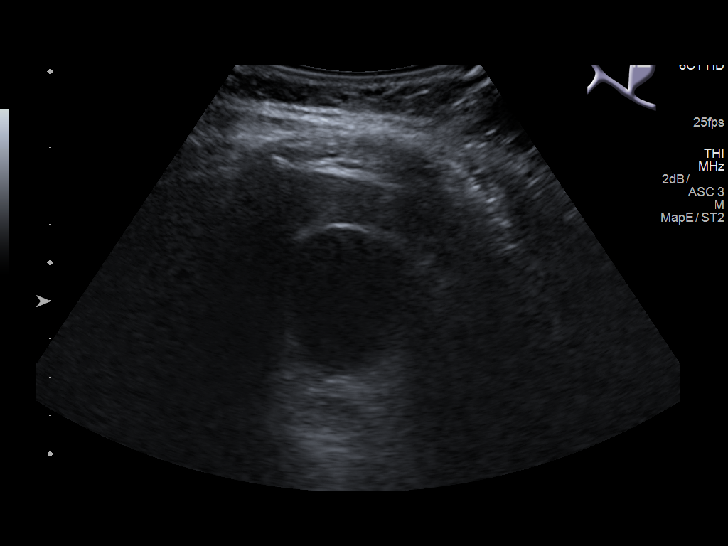
[im 22/47]
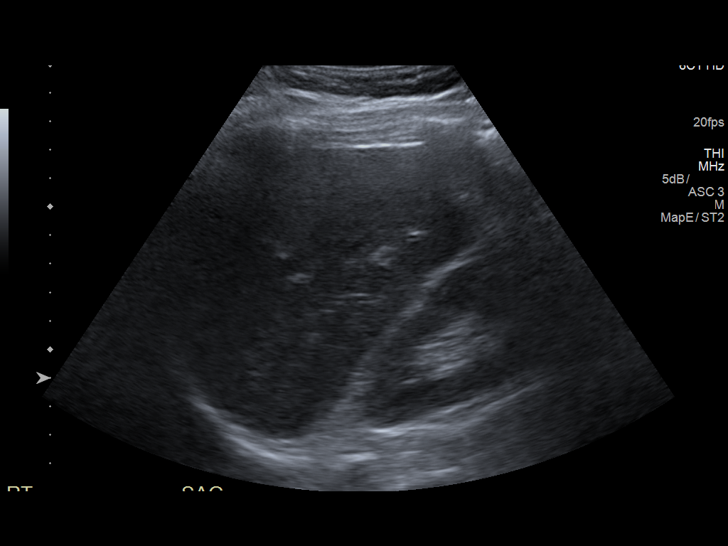
[im 25/47]
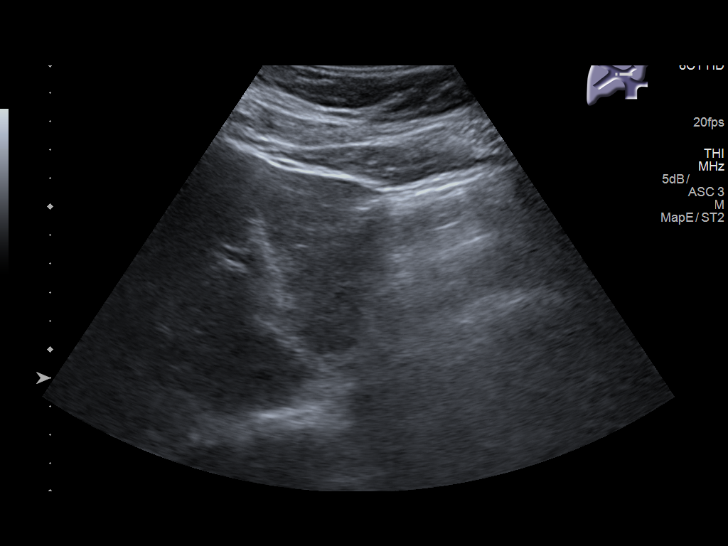
[im 29/47]
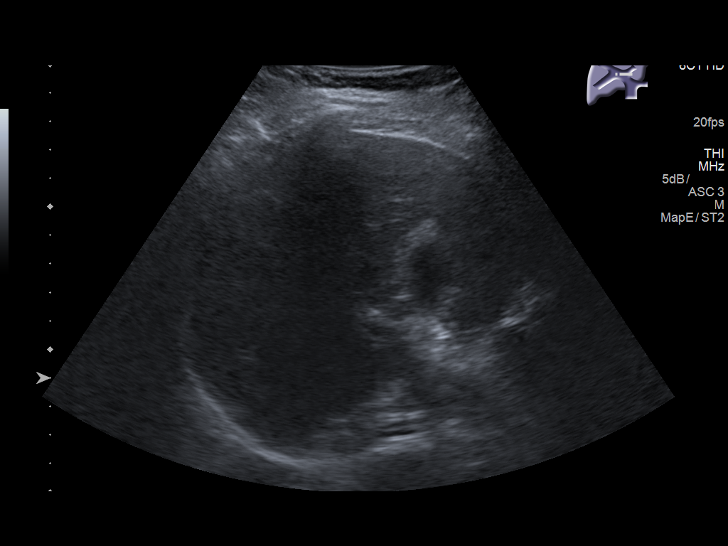
[im 31/47]
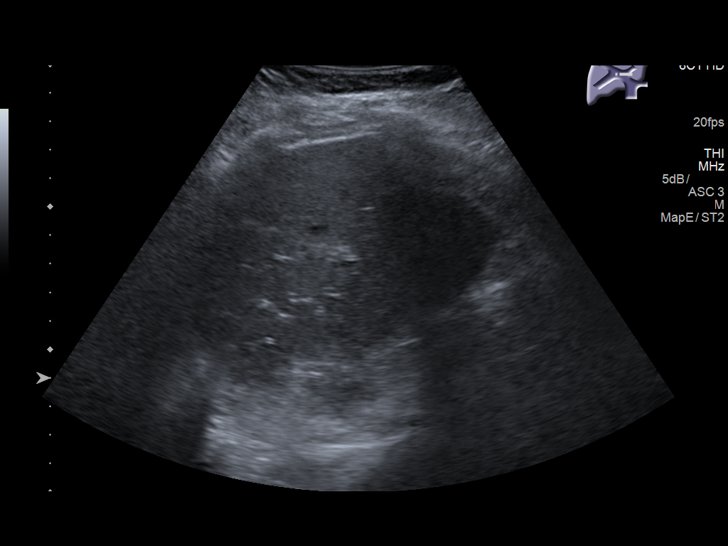
[im 35/47]
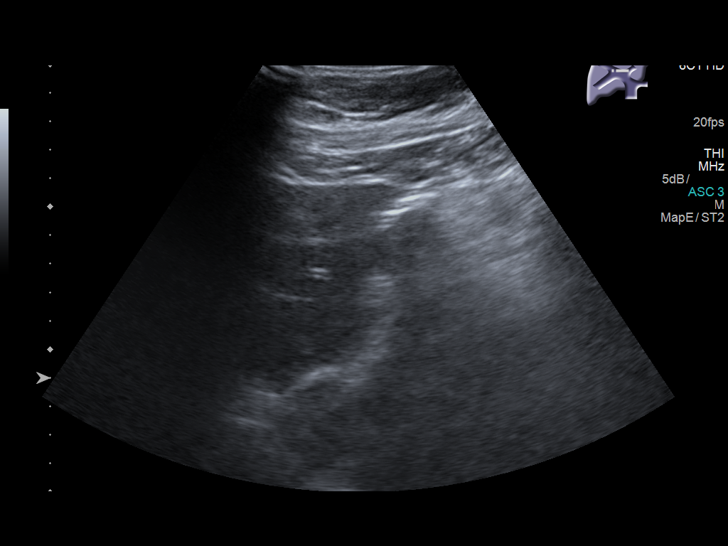
[im 39/47]
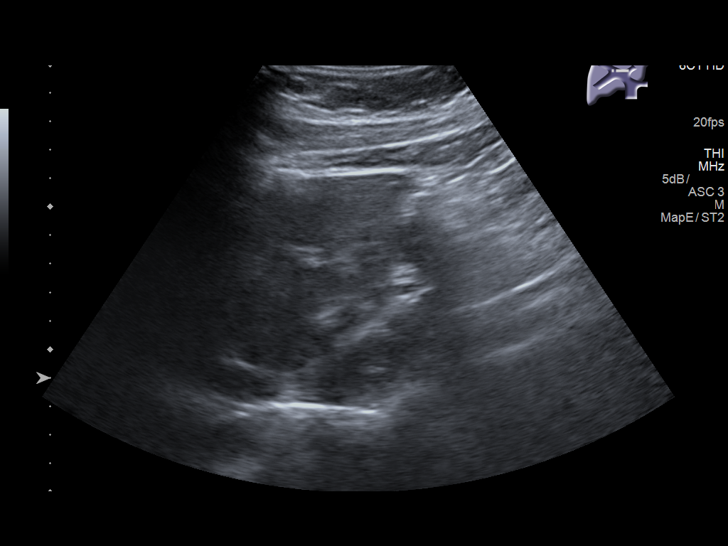
[im 43/47]
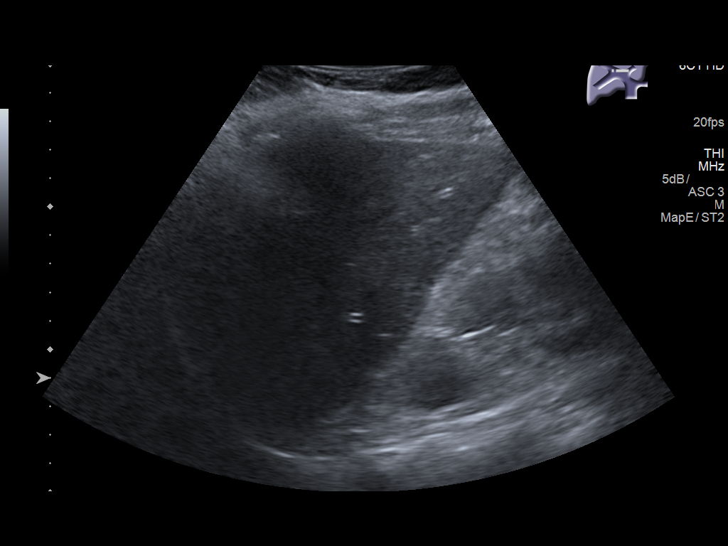
[im 47/47]
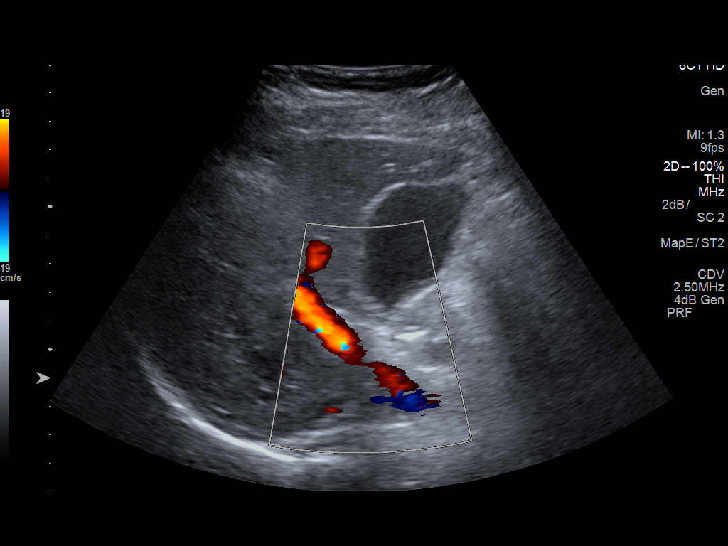

[14 of 25 positions shown; findings below may reference images not displayed]

FINDINGS: Evaluation is limited due to body habitus.

Gallbladder:

No gallstones or wall thickening visualized. No sonographic Murphy
sign noted by sonographer.

Common bile duct:

Diameter: 3 mm

Liver:

The liver is unremarkable as visualized. Portal vein is patent on
color Doppler imaging with normal direction of blood flow towards
the liver.
IMPRESSION: Unremarkable right upper quadrant ultrasound.

## 2020-01-25 ENCOUNTER — Other Ambulatory Visit: Payer: Self-pay | Admitting: Gastroenterology

## 2020-01-29 DIAGNOSIS — E1042 Type 1 diabetes mellitus with diabetic polyneuropathy: Secondary | ICD-10-CM | POA: Diagnosis not present

## 2020-01-29 DIAGNOSIS — E669 Obesity, unspecified: Secondary | ICD-10-CM | POA: Diagnosis not present

## 2020-01-29 DIAGNOSIS — E785 Hyperlipidemia, unspecified: Secondary | ICD-10-CM | POA: Diagnosis not present

## 2020-01-29 DIAGNOSIS — Z6837 Body mass index (BMI) 37.0-37.9, adult: Secondary | ICD-10-CM | POA: Diagnosis not present

## 2020-02-08 ENCOUNTER — Telehealth: Payer: Self-pay

## 2020-02-08 NOTE — Telephone Encounter (Signed)
Ok to send refills of Atrovent and Allegra for 1 month plus 1 refill to get patient to her next follow up appointment? Last seen 03-2019.

## 2020-02-09 MED ORDER — IPRATROPIUM BROMIDE 0.03 % NA SOLN: 2.0000 | Freq: Three times a day (TID) | NASAL | 1 refills | Status: AC | PRN

## 2020-02-09 MED ORDER — FEXOFENADINE HCL 180 MG PO TABS: 180.0000 mg | ORAL_TABLET | Freq: Every day | ORAL | 1 refills | Status: DC

## 2020-02-09 NOTE — Telephone Encounter (Signed)
Yes can refill, and would be good to have her follow up with me for reassessment. Thanks

## 2020-02-09 NOTE — Telephone Encounter (Signed)
Sent script for 1 month with 1 refill indicating  Pt needs to make an appt for further refills.

## 2020-02-23 ENCOUNTER — Other Ambulatory Visit: Payer: Self-pay

## 2020-02-23 ENCOUNTER — Ambulatory Visit (INDEPENDENT_AMBULATORY_CARE_PROVIDER_SITE_OTHER): Payer: 59 | Admitting: Podiatry

## 2020-02-23 ENCOUNTER — Encounter: Payer: Self-pay | Admitting: Podiatry

## 2020-02-23 VITALS — Temp 97.1°F

## 2020-02-23 DIAGNOSIS — B351 Tinea unguium: Secondary | ICD-10-CM

## 2020-02-23 DIAGNOSIS — E1040 Type 1 diabetes mellitus with diabetic neuropathy, unspecified: Secondary | ICD-10-CM | POA: Diagnosis not present

## 2020-02-23 DIAGNOSIS — M79674 Pain in right toe(s): Secondary | ICD-10-CM

## 2020-02-23 DIAGNOSIS — M79675 Pain in left toe(s): Secondary | ICD-10-CM

## 2020-02-23 DIAGNOSIS — L600 Ingrowing nail: Secondary | ICD-10-CM

## 2020-02-26 NOTE — Progress Notes (Signed)
Subjective:   Patient ID: Cynthia Moody, female   DOB: 49 y.o.   MRN: [id number]   HPI 49 year old female presents the office today for concerns of tenderness that she cannot trim, ingrown toenails both of her big toes.  She states is difficult for her to trim them and she cannot see when she does try to cut them she can cut the skin causing bleeding.  She is diabetic and last A1c was 8.9.  It was 176 this afternoon she reports.  No redness or drainage to the toenail sites.  No open lesions.   Review of Systems  All other systems reviewed and are negative.  Past Medical History:  Diagnosis Date  . Acne   . Anemia   . Cough 01/27/2016  . Depression   . Diabetes mellitus type 1 ([place])   . Diabetic neuropathy (HCC)    feet  . Diabetic retinopathy ([place])    bilateral  . Dyslipidemia   . GERD (gastroesophageal reflux disease)   . Infertility, female   . Post-nasal drip 01/27/2016  . Shoulder pain, bilateral    wakes up frequently due to pain  . Stuffy nose 01/27/2016  . Trigger finger of right hand 01/2016   index finger    Past Surgical History:  Procedure Laterality Date  . CESAREAN SECTION  01/17/1995  . INTRAUTERINE DEVICE (IUD) INSERTION  06/2017   Mirena   . SHOULDER SURGERY Left 06/29/2013   exc. bone spur  . TRIGGER FINGER RELEASE Right 02/02/2016   Procedure: RIGHT INDEX RELEASE TRIGGER  ;  Surgeon: Leanora Cover, MD;  Location: [place];  Service: Orthopedics;  Laterality: Right;     Current Outpatient Medications:  .  acetaminophen (TYLENOL) 500 MG tablet, Take 1,000 mg by mouth every 6 (six) hours as needed., Disp: , Rfl:  .  b complex vitamins tablet, Take 1 tablet by mouth daily., Disp: , Rfl:  .  cholecalciferol (VITAMIN D) 1000 UNITS tablet, Take 1,000 Units by mouth daily., Disp: , Rfl:  .  Continuous Blood Gluc Sensor (FREESTYLE LIBRE 14 DAY SENSOR) MISC, USE 1 UNITS EVERY 14 DAYS. TO CHECK BLLOD SUGAR CHENGE THE UNIT EVERY 14 DAYS, Disp: , Rfl: 11 .   Dextromethorphan HBr (ROBITUSSIN LINGERING COUGHGELS) 15 MG CAPS, Take by mouth daily as needed., Disp: , Rfl:  .  Ferrous Sulfate Dried (SLOW RELEASE IRON) 45 MG TBCR, Take 45 mg by mouth daily. , Disp: , Rfl:  .  fexofenadine (ALLEGRA) 180 MG tablet, Take 1 tablet (180 mg total) by mouth daily., Disp: 30 tablet, Rfl: 1 .  ibuprofen (ADVIL) 200 MG tablet, Take 400 mg by mouth every 6 (six) hours as needed., Disp: , Rfl:  .  Insulin Human (INSULIN PUMP) SOLN, Inject into the skin continuous. Humalog, Disp: , Rfl:  .  Insulin Infusion Pump Supplies (AUTOSOFT 90 INFUSION SET) MISC, CHANGE EVERY 2-3 DAYS, Disp: , Rfl:  .  Insulin Infusion Pump Supplies (PARADIGM QUICK-SET 32" 6MM) MISC, 1 Device by Does not apply route every 3 (three) days., Disp: , Rfl:  .  Insulin Infusion Pump Supplies (PARADIGM RESERVOIR 3ML) MISC, 1 Device by Does not apply route every 3 (three) days., Disp: , Rfl:  .  insulin lispro (HUMALOG) 100 UNIT/ML injection, VIA INSULIN PUMP, total of 90 units/day, Disp: 90 mL, Rfl: 3 .  ipratropium (ATROVENT) 0.03 % nasal spray, Place 2 sprays into both nostrils every 8 (eight) hours as needed for rhinitis. Please schedule a  follow up appointment for further refills. Thank you, Disp: 30 mL, Rfl: 1 .  levonorgestrel (MIRENA) 20 MCG/24HR IUD, 1 each by Intrauterine route once., Disp: , Rfl:  .  omeprazole (PRILOSEC) 20 MG capsule, TAKE 1 CAPSULE (20 MG TOTAL) BY MOUTH 2 (TWO) TIMES DAILY. TAKE BEFORE A MEAL, Disp: 180 capsule, Rfl: 0 .  Pediatric Multivit-Minerals-C (KIDS GUMMY BEAR VITAMINS PO), Take 2 each by mouth daily., Disp: , Rfl:  No current facility-administered medications for this visit.  Facility-Administered Medications Ordered in Other Visits:  .  heparin lock flush 100 unit/mL, 500 Units, Intracatheter, Once PRN, Brunetta Genera, MD .  sodium chloride 0.9 % injection 10 mL, 10 mL, Intracatheter, PRN, Brunetta Genera, MD  Allergies  Allergen Reactions  . Statins  Anaphylaxis    Leg cramps  . Lipitor [Atorvastatin] Swelling    LEGS  . Jardiance [Empagliflozin]     Pt stated "It gave me DKA"  . Simvastatin Other (See Comments)    Severe leg cramps          Objective:  Physical Exam  General: AAO x3, NAD  Dermatological: Nails appear mildly hypertrophic, dystrophic with yellow-brown discoloration there is incurvation present to bilateral hallux toenails.  There is no edema, erythema.  No drainage or pus.  No open lesions.  Vascular: Dorsalis Pedis artery and Posterior Tibial artery pedal pulses are 2/4 bilateral with immedate capillary fill time. There is no pain with calf compression, swelling, warmth, erythema.   Neruologic: Sensation decreased with Semmes Weinstein monofilament  Musculoskeletal:  Muscular strength 5/5 in all groups tested bilateral.  Gait: Unassisted, Nonantalgic.       Assessment:   Symptomatic onychomycosis, ingrown toenails     Plan:  -Treatment options discussed including all alternatives, risks, and complications -Etiology of symptoms were discussed -Nails debrided 10 without complications or bleeding.  Wants to hold off on partial nail avulsion.  Is able to debride the symptomatic portion of the ingrowing nails but any complications or bleeding. -Daily foot inspection -Follow-up in 3 months or sooner if any problems arise. In the meantime, encouraged to call the office with any questions, concerns, change in symptoms.   Celesta Gentile, DPM

## 2020-03-17 ENCOUNTER — Ambulatory Visit: Payer: 59

## 2020-03-18 ENCOUNTER — Ambulatory Visit: Payer: 59 | Attending: Internal Medicine

## 2020-03-18 DIAGNOSIS — Z23 Encounter for immunization: Secondary | ICD-10-CM

## 2020-03-18 NOTE — Progress Notes (Signed)
   Covid-19 Vaccination Clinic  Name:  Cynthia Moody    MRN: [id number] DOB: [birth year]  03/18/2020  [name] was observed post Covid-19 immunization for 15 minutes without incident. She was provided with Vaccine Information Sheet and instruction to access the V-Safe system.   [name] was instructed to call 911 with any severe reactions post vaccine: [name] Kitchen Difficulty breathing  . Swelling of face and throat  . A fast heartbeat  . A bad rash all over body  . Dizziness and weakness   Immunizations Administered    Name Date Dose VIS Date Route   Pfizer COVID-19 Vaccine 03/18/2020 10:00 AM 0.3 mL 12/04/2019 Intramuscular   Manufacturer: [place]   Lot: [id number]   [place]: [id number]

## 2020-04-01 ENCOUNTER — Encounter: Payer: Self-pay | Admitting: Adult Health

## 2020-04-01 ENCOUNTER — Ambulatory Visit (INDEPENDENT_AMBULATORY_CARE_PROVIDER_SITE_OTHER): Payer: Managed Care, Other (non HMO) | Admitting: Family Medicine

## 2020-04-01 ENCOUNTER — Encounter: Payer: Self-pay | Admitting: Family Medicine

## 2020-04-01 ENCOUNTER — Other Ambulatory Visit: Payer: Self-pay

## 2020-04-01 VITALS — BP 119/90 | HR 101 | Temp 98.1°F | Resp 14 | Ht 66.75 in | Wt 242.7 lb

## 2020-04-01 DIAGNOSIS — E1049 Type 1 diabetes mellitus with other diabetic neurological complication: Secondary | ICD-10-CM | POA: Diagnosis not present

## 2020-04-01 DIAGNOSIS — E1069 Type 1 diabetes mellitus with other specified complication: Secondary | ICD-10-CM | POA: Diagnosis not present

## 2020-04-01 DIAGNOSIS — E08620 Diabetes mellitus due to underlying condition with diabetic dermatitis: Secondary | ICD-10-CM

## 2020-04-01 DIAGNOSIS — I872 Venous insufficiency (chronic) (peripheral): Secondary | ICD-10-CM

## 2020-04-01 DIAGNOSIS — E1165 Type 2 diabetes mellitus with hyperglycemia: Secondary | ICD-10-CM | POA: Diagnosis not present

## 2020-04-01 DIAGNOSIS — Z723 Lack of physical exercise: Secondary | ICD-10-CM

## 2020-04-01 DIAGNOSIS — S81801A Unspecified open wound, right lower leg, initial encounter: Secondary | ICD-10-CM

## 2020-04-01 MED ORDER — TRIAMCINOLONE ACETONIDE 0.5 % EX CREA: 1.0000 "application " | TOPICAL_CREAM | Freq: Three times a day (TID) | CUTANEOUS | 0 refills | Status: DC

## 2020-04-01 NOTE — Progress Notes (Signed)
Impression and Recommendations:    1. Type 1 diabetes mellitus with other specified complication ([place])   2. Poorly controlled diabetes mellitus ([place])   3. 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 Wound of right lower extremity, initial encounter   5. 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 Morbid obesity ([place])   7. Edema of both lower extremities due to peripheral venous insufficiency   8. Inactivity     Of note, this is my first time meeting patient.  Patient is new to me and was previously being cared for at our office by an NP, who no longer works at [employer].   Will be seen by Lorrene Reid, PA-C in future.   Wound of RLE, Morbid Obesity, Edema of BLE, Inactivity, Poorly Controlled DM, Diabetic Dermatitis  - Discussed prescription cream to help control itching in the area. - Prescription provided.   R/B meds d/c pt- only use on designated areas.   See med list.  - Emphasized CRITICAL importance of not touching the affected area.  - To promote healing, advised patient that she needs to avoid picking, poking, and scratching at the area.  - Advised use of bacitracin antibiotic on areas of open skin- none are infected and all look good.  - Continue Allegra antihistamine daily OTC.  - Advised patient to get up, walk around, drink adequate amounts of water, and move as often as possible.  - To help reduce swelling in the lower extremities, encouraged patient to elevate her legs while seated.  - Critical importance of regular cardiovascular exercise discussed at length.  Encouraged patient to begin with 5 minutes of walking on a flat surface in the morning, 5 minutes of walking at lunch, and 5 minutes of walking in the afternoon.  - Advised patient that the swelling in her legs and concurrent diagnosis of diabetes will  contribute to decreased wound healing.   - Despite her having ENDO Doc--->  Discussed critical importance of controlling her blood sugar, and engaging in prudent weight loss to help reduce swelling in the legs.  - IF NI--> refer to Derm   Meds ordered this encounter  Medications  . triamcinolone cream (KENALOG) 0.5 %    Sig: Apply 1 application topically 3 (three) times daily. To affected areas.    Dispense:  80 g    Refill:  0    Medications Discontinued During This Encounter  Medication Reason  . Continuous Blood Gluc Sensor (FREESTYLE LIBRE 14 DAY SENSOR) MISC Error      Please see AVS handed out to patient at the end of our visit for further patient instructions/ counseling done pertaining to today's office visit.   Return for F-up of current med issues as previously d/c pt with PCP for chronic care.     Note:  This note was prepared with assistance of Dragon voice recognition software. Occasional wrong-word or sound-a-like substitutions may have occurred due to the inherent limitations of voice recognition software.   The [place] was signed into law in 2016 which includes the topic of electronic health records.  This provides immediate access to information in MyChart.  This includes consultation notes, operative notes, office notes, lab results and pathology reports.  If you have any questions about what you read please let us know at your next visit or call  us at the office.  We are right here with you.   This case required medical decision making of at least moderate complexity.  This document serves as a record of services personally performed by Mellody Dance, DO. It was created on her behalf by Toni Amend, a trained medical scribe. The creation of this record is based on the scribe's personal observations and the provider's statements to them.    The above documentation from Toni Amend, medical scribe, has been reviewed by Marjory Sneddon, D.O.       --------------------------------------------------------------------------------------------------------------------------------------------------------------------------------------------------------------------------------------------    Subjective:     Phillips Odor, am serving as scribe for Dr.Tiera Mensinger.   HPI: Cynthia Moody is a 49 y.o. female who presents to [facility] today for issues as discussed below.  - Skin Spots & Itching on Lower Extremity She has concerns about spots on her b/l lower ext shins/ anterior aspect.  Notes it "started with one in November."  Says that the area would leak clear liquid, and wouldn't heal at times.   The first spot was initially itching, so "I would dig at it and dig at it."    She would get up in the middle of the night and "scratched and just ripped it raw," and has had all little scabs across her anterior shins b/l since- W on R since she itches more on that side.    Admits that she picks at skin in the area constatnly, she would pick scabs and it would get worse.  "Once they become scabbed, I can't leave them alone."  States "the itching is what started the process."  In addition, says "there's a cottage cheese feeling under my skin, and it's just not right."    She showed her diabetes doctor the skin concern and was told it wasn't from shaving.   Says with L leg, she's "not digging and scratching and clawing myself to death" like the other.    Notes she has always had a problem with picking, digging, and scratching, but this has gotten progressively worse with more stress in her life and W DM.[name] Kitchen   To treat, she's been using Vaseline lotion on the area, and hydrocortisone cream maximum strength OTC.  Says she's "blowing through that like crazy, slathering it on."   In December, she had a job interview.  She had been working from home for the past year and wasn't able to put on the  shoes she used to wear out to work.  States that her feet had swollen to the point where she couldn't put on her work shoes.  Notes she's gained 30 lbs over the time she has been working from home, swelling much worse in b/l LExt and itching worse, W stress and W DM   States that when she wears socks, she has been having indents in her skin due to the extra swelling.  She has tried compression socks, but notes "those are killing me, and I can only do those for maybe two hours or so."    Wt Readings from Last 3 Encounters:  04/01/20 242 lb 11.2 oz (110.1 kg)  04/15/19 213 lb (96.6 kg)  02/16/19 220 lb 14.4 oz (100.2 kg)   BP Readings from Last 3 Encounters:  04/01/20 119/90  02/16/19 114/76  01/27/19 138/84   Pulse Readings from Last 3 Encounters:  04/01/20 (!) 101  02/16/19 90  01/27/19 80   BMI Readings from Last 3  Encounters:  04/01/20 38.30 kg/m  04/15/19 34.38 kg/m  02/16/19 33.59 kg/m     Patient Care Team    Relationship Specialty Notifications Start End  Esaw Grandchild, NP PCP - General Family Medicine  04/29/17      Patient Active Problem List   Diagnosis Date Noted  . Poorly controlled diabetes mellitus ([place]) 04/01/2020  . 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 Morbid obesity ([place]) 04/01/2020  . Edema of both lower extremities due to peripheral venous insufficiency 04/01/2020  . Screening for breast cancer 02/16/2019  . DKA (diabetic ketoacidoses) ([place]) 10/26/2018  . Type 1 diabetes mellitus with other specified complication ([place]) 99991111  . AKI (acute kidney injury) ([place]) 10/26/2018  . Thrombocytosis ([place]) 10/26/2018  . Fever 10/09/2018  . Cough 10/09/2018  . Healthcare maintenance 01/09/2018  . Poor dentition 01/09/2018  . Bronchitis, acute 07/25/2017  . Leg wound, right 07/25/2017  . Sinusitis, acute maxillary 07/25/2017  . Ingrown toenail 06/03/2017  . Yeast infection 04/29/2017  . BMI  37.0-37.9, adult 12/07/2016  . Diabetic neuropathy ([place]) 12/07/2016  . Hyperlipemia 12/07/2016  . 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 Trigger finger of both hands 10/25/2015  . Acquired trigger finger 10/18/2015  . Discharge from the vagina 10/18/2015  . Iron deficiency 10/18/2015  . Type 1 diabetes mellitus with hyperglycemia ([place]) 10/18/2015  . Diabetes ([place]) 08/16/2015  . Dyslipidemia 08/16/2015  . GERD (gastroesophageal reflux disease) 08/16/2015  . Anemia, iron deficiency 08/16/2015    Past Medical history, Surgical history, Family history, Social history, Allergies and Medications have been entered into the medical record, reviewed and changed as needed.    Current Meds  Medication Sig  . acetaminophen (TYLENOL) 500 MG tablet Take 1,000 mg by mouth every 6 (six) hours as needed.  . cholecalciferol (VITAMIN D) 1000 UNITS tablet Take 1,000 Units by mouth daily.  . Ferrous Sulfate Dried (SLOW RELEASE IRON) 45 MG TBCR Take 45 mg by mouth daily.   . fexofenadine (ALLEGRA) 180 MG tablet Take 1 tablet (180 mg total) by mouth daily.  [name] Kitchen ibuprofen (ADVIL) 200 MG tablet Take 400 mg by mouth every 6 (six) hours as needed.  . insulin aspart (NOVOLOG) 100 UNIT/ML injection Inject 90 Units into the skin 3 (three) times daily before meals.  . Insulin Human (INSULIN PUMP) SOLN Inject into the skin continuous. Humalog  . Insulin Infusion Pump Supplies (AUTOSOFT 90 INFUSION SET) MISC CHANGE EVERY 2-3 DAYS  . Insulin Infusion Pump Supplies (PARADIGM QUICK-SET 32" 6MM) MISC 1 Device by Does not apply route every 3 (three) days.  . Insulin Infusion Pump Supplies (PARADIGM RESERVOIR 3ML) MISC 1 Device by Does not apply route every 3 (three) days.  [name] Kitchen ipratropium (ATROVENT) 0.03 % nasal spray Place 2 sprays into both nostrils every 8 (eight) hours as needed for rhinitis. Please schedule a follow up appointment for further refills. Thank you  . levonorgestrel (MIRENA) 20  MCG/24HR IUD 1 each by Intrauterine route once.  [name] Kitchen omeprazole (PRILOSEC) 20 MG capsule TAKE 1 CAPSULE (20 MG TOTAL) BY MOUTH 2 (TWO) TIMES DAILY. TAKE BEFORE A MEAL  . Pediatric Multivit-Minerals-C (KIDS GUMMY BEAR VITAMINS PO) Take 2 each by mouth daily.    Allergies:  Allergies  Allergen Reactions  . Statins Anaphylaxis    Leg cramps  . Lipitor [Atorvastatin] Swelling    LEGS  . Jardiance [Empagliflozin]     Pt stated "It gave me DKA"  .  Simvastatin Other (See Comments)    Severe leg cramps     Review of Systems:  A fourteen system review of systems was performed and found to be positive as per HPI.   Objective:   Blood pressure 119/90, pulse (!) 101, temperature 98.1 F (36.7 C), temperature source Oral, resp. rate 14, height 5' 6.75" (1.695 m), weight 242 lb 11.2 oz (110.1 kg), SpO2 98 %. Body mass index is 38.3 kg/m. General:  Well Developed, well nourished, appropriate for stated age.  Neuro:  Alert and oriented,  extra-ocular muscles intact  HEENT:  Normocephalic, atraumatic, neck supple, no carotid bruits appreciated  Skin:   gross rash,venous stasis dermatitis and chronic scarring from previouspicked scabs warm, pink. Respiratory: Not using accessory muscles, speaking in full sentences- unlabored. Vascular:  Ext warm, no cyanosis apprec.; cap RF less 2 sec. Psych:  No HI/SI, judgement and insight good, Euthymic mood. Full Affect. Lower Extremity: 25 or so desurfaced and excoriated erythematous papules on the anterior aspect of her right lower leg.  Some are a little bit scabbed, some fresher than others

## 2020-04-01 NOTE — Patient Instructions (Signed)

## 2020-04-11 ENCOUNTER — Ambulatory Visit: Payer: 59 | Attending: Internal Medicine

## 2020-04-11 DIAGNOSIS — Z23 Encounter for immunization: Secondary | ICD-10-CM

## 2020-04-11 NOTE — Progress Notes (Signed)
   Covid-19 Vaccination Clinic  Name:  Cynthia Moody    MRN: [id number] DOB: 1971-04-14  04/11/2020  [name] was observed post Covid-19 immunization for 15 minutes without incident. She was provided with Vaccine Information Sheet and instruction to access the V-Safe system.   [name] was instructed to call 911 with any severe reactions post vaccine: [name] Kitchen Difficulty breathing  . Swelling of face and throat  . A fast heartbeat  . A bad rash all over body  . Dizziness and weakness   Immunizations Administered    Name Date Dose VIS Date Route   Pfizer COVID-19 Vaccine 04/11/2020  1:52 PM 0.3 mL 02/17/2019 Intramuscular   Manufacturer: [place]   Lot: [id number]   [place]: [id number]

## 2020-05-10 ENCOUNTER — Other Ambulatory Visit: Payer: Self-pay | Admitting: Physician Assistant

## 2020-05-10 DIAGNOSIS — E10319 Type 1 diabetes mellitus with unspecified diabetic retinopathy without macular edema: Secondary | ICD-10-CM

## 2020-05-10 DIAGNOSIS — Z0000 Encounter for general adult medical examination without abnormal findings: Secondary | ICD-10-CM

## 2020-05-10 DIAGNOSIS — E611 Iron deficiency: Secondary | ICD-10-CM

## 2020-05-10 DIAGNOSIS — E785 Hyperlipidemia, unspecified: Secondary | ICD-10-CM

## 2020-05-10 DIAGNOSIS — D509 Iron deficiency anemia, unspecified: Secondary | ICD-10-CM

## 2020-05-13 ENCOUNTER — Other Ambulatory Visit: Payer: 59

## 2020-05-19 ENCOUNTER — Encounter: Payer: 59 | Admitting: Physician Assistant

## 2020-05-26 ENCOUNTER — Ambulatory Visit: Payer: 59 | Admitting: Podiatry

## 2020-08-24 IMAGING — DX DG CHEST 1V PORT
1 series · 1 of 1 positions shown · non-contrast
Comparison: None.

CLINICAL DATA: 47-year-old female with diabetic ketoacidosis.

EXAM:
PORTABLE CHEST 1 VIEW

[chest]
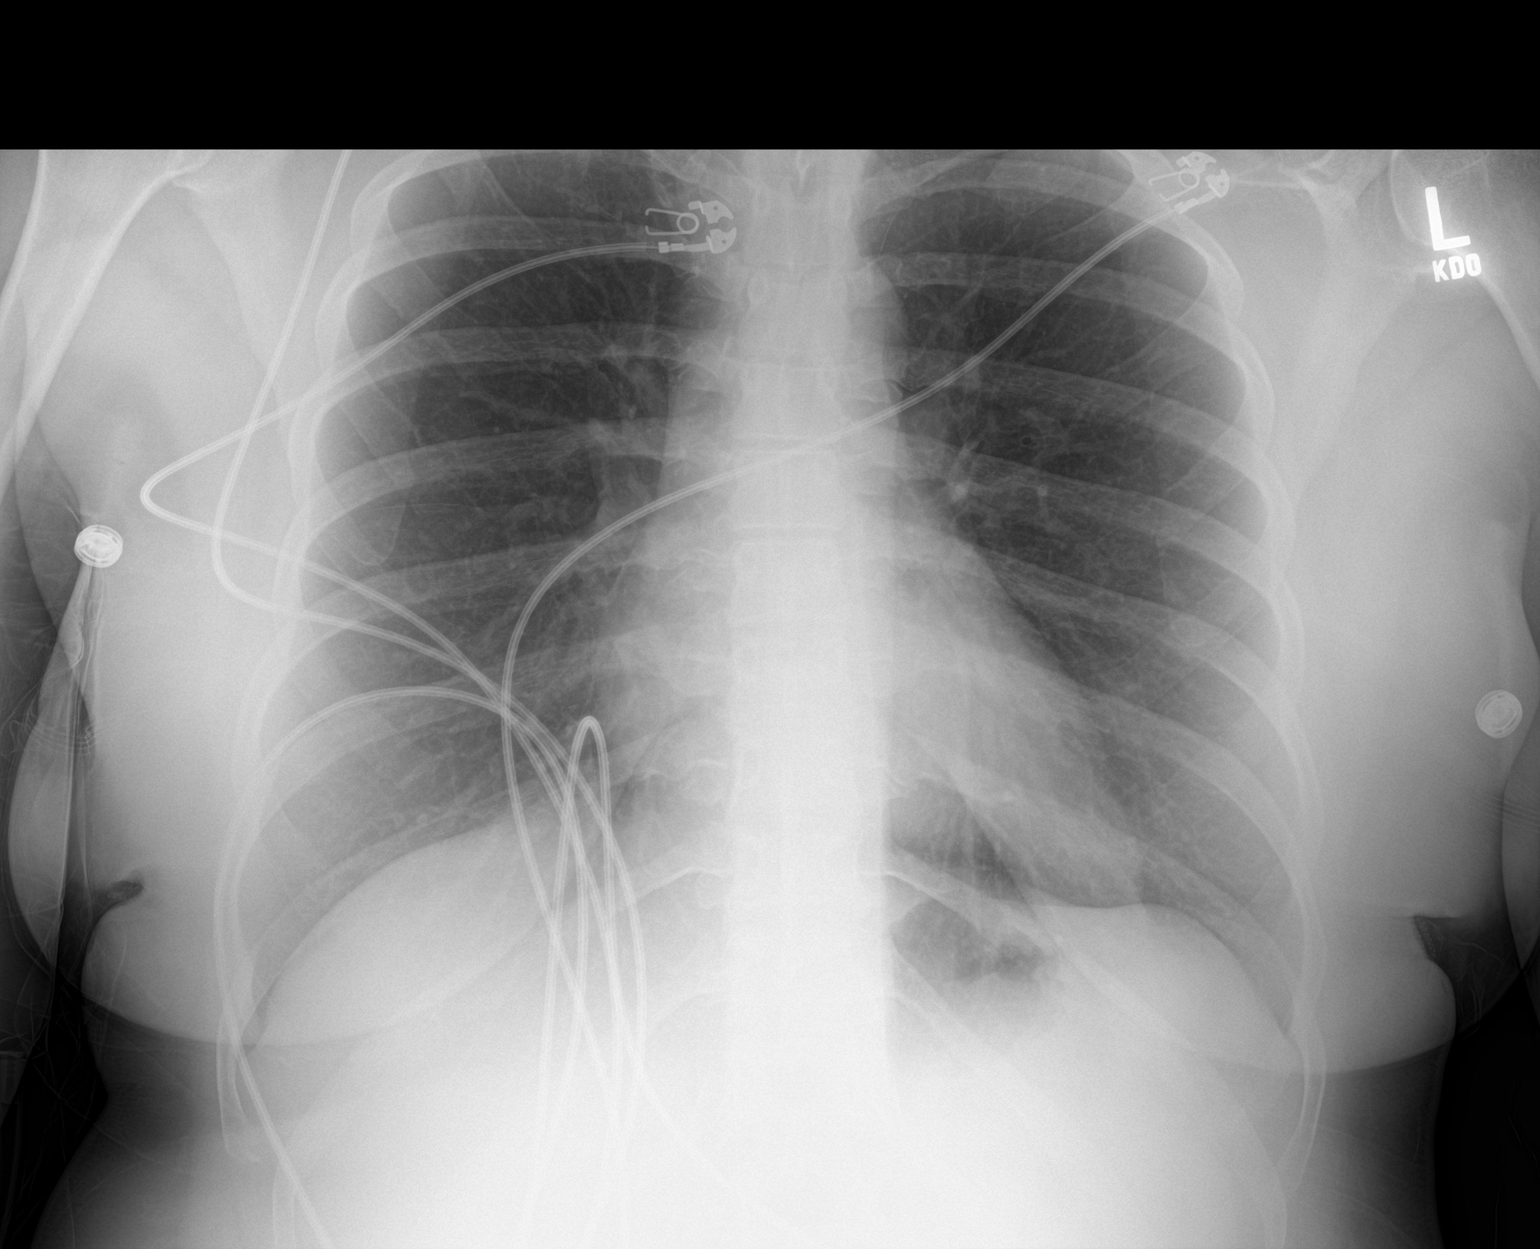

[1 of 1 positions shown; findings below may reference images not displayed]

FINDINGS: The heart size and mediastinal contours are within normal limits.
Both lungs are clear. The visualized skeletal structures are
unremarkable.
IMPRESSION: No active disease.

## 2020-10-21 ENCOUNTER — Other Ambulatory Visit: Payer: Self-pay | Admitting: Gastroenterology

## 2020-11-08 DIAGNOSIS — L299 Pruritus, unspecified: Secondary | ICD-10-CM | POA: Diagnosis not present

## 2020-11-08 DIAGNOSIS — R030 Elevated blood-pressure reading, without diagnosis of hypertension: Secondary | ICD-10-CM | POA: Diagnosis not present

## 2020-11-08 DIAGNOSIS — R69 Illness, unspecified: Secondary | ICD-10-CM | POA: Diagnosis not present

## 2020-11-08 DIAGNOSIS — R198 Other specified symptoms and signs involving the digestive system and abdomen: Secondary | ICD-10-CM | POA: Diagnosis not present

## 2020-11-08 DIAGNOSIS — E785 Hyperlipidemia, unspecified: Secondary | ICD-10-CM | POA: Diagnosis not present

## 2020-11-08 DIAGNOSIS — K219 Gastro-esophageal reflux disease without esophagitis: Secondary | ICD-10-CM | POA: Diagnosis not present

## 2020-12-06 DIAGNOSIS — I10 Essential (primary) hypertension: Secondary | ICD-10-CM | POA: Diagnosis not present

## 2020-12-06 DIAGNOSIS — R69 Illness, unspecified: Secondary | ICD-10-CM | POA: Diagnosis not present

## 2020-12-06 DIAGNOSIS — R609 Edema, unspecified: Secondary | ICD-10-CM | POA: Diagnosis not present

## 2020-12-06 DIAGNOSIS — E785 Hyperlipidemia, unspecified: Secondary | ICD-10-CM | POA: Diagnosis not present

## 2020-12-26 DIAGNOSIS — Z20822 Contact with and (suspected) exposure to covid-19: Secondary | ICD-10-CM | POA: Diagnosis not present

## 2020-12-30 DIAGNOSIS — U071 COVID-19: Secondary | ICD-10-CM | POA: Diagnosis not present

## 2020-12-30 DIAGNOSIS — R058 Other specified cough: Secondary | ICD-10-CM | POA: Diagnosis not present

## 2021-01-17 DIAGNOSIS — I10 Essential (primary) hypertension: Secondary | ICD-10-CM | POA: Diagnosis not present

## 2021-01-17 DIAGNOSIS — R059 Cough, unspecified: Secondary | ICD-10-CM | POA: Diagnosis not present

## 2021-01-17 DIAGNOSIS — R69 Illness, unspecified: Secondary | ICD-10-CM | POA: Diagnosis not present

## 2021-02-09 DIAGNOSIS — E785 Hyperlipidemia, unspecified: Secondary | ICD-10-CM | POA: Diagnosis not present

## 2021-02-09 DIAGNOSIS — E10319 Type 1 diabetes mellitus with unspecified diabetic retinopathy without macular edema: Secondary | ICD-10-CM | POA: Diagnosis not present

## 2021-03-01 DIAGNOSIS — E1042 Type 1 diabetes mellitus with diabetic polyneuropathy: Secondary | ICD-10-CM | POA: Diagnosis not present

## 2021-03-01 DIAGNOSIS — E108 Type 1 diabetes mellitus with unspecified complications: Secondary | ICD-10-CM | POA: Diagnosis not present

## 2021-03-01 DIAGNOSIS — Z794 Long term (current) use of insulin: Secondary | ICD-10-CM | POA: Diagnosis not present

## 2021-03-01 DIAGNOSIS — E109 Type 1 diabetes mellitus without complications: Secondary | ICD-10-CM | POA: Diagnosis not present

## 2021-03-02 DIAGNOSIS — I10 Essential (primary) hypertension: Secondary | ICD-10-CM | POA: Diagnosis not present

## 2021-03-02 DIAGNOSIS — R053 Chronic cough: Secondary | ICD-10-CM | POA: Diagnosis not present

## 2021-03-02 DIAGNOSIS — R0982 Postnasal drip: Secondary | ICD-10-CM | POA: Diagnosis not present

## 2021-03-02 DIAGNOSIS — K219 Gastro-esophageal reflux disease without esophagitis: Secondary | ICD-10-CM | POA: Diagnosis not present

## 2021-04-12 DIAGNOSIS — E669 Obesity, unspecified: Secondary | ICD-10-CM | POA: Diagnosis not present

## 2021-04-12 DIAGNOSIS — Z6837 Body mass index (BMI) 37.0-37.9, adult: Secondary | ICD-10-CM | POA: Diagnosis not present

## 2021-04-12 DIAGNOSIS — R059 Cough, unspecified: Secondary | ICD-10-CM | POA: Diagnosis not present

## 2021-04-12 DIAGNOSIS — R058 Other specified cough: Secondary | ICD-10-CM | POA: Diagnosis not present

## 2021-04-12 DIAGNOSIS — R053 Chronic cough: Secondary | ICD-10-CM | POA: Diagnosis not present

## 2022-04-09 NOTE — Progress Notes (Signed)
51 y.o. G66P1001 Married White or Caucasian Not Hispanic or Latino female here for annual exam.  She has a mirena IUD, placed in 7/18. No cycles. Very rarely sexually active, husband with ED.  ? ?Mild GSI, tolerable. She wears a thin poise pad daily. Better with use of allergy medication. ? ?No bowel c/o.  ?  ?Last HgbA1c ~8.3% ? ?No LMP recorded. (Menstrual status: IUD).          ?Sexually active: Yes.    ?The current method of family planning is IUD. Inserted 06-2017   ?Exercising: Yes.    Working on it.  ?Smoker:  no ? ?Health Maintenance: ?Pap:  05-15-17 normal Neg HPV; 10/18/15 negative, HR HPV negative  ?History of abnormal Pap:  no ?MMG:  11-08-21 normal Bi-RADS 1 Density Cat.C ?BMD: N/A ?Colonoscopy: 2011 small polyps--for anemia ?TDaP 2022 ?Gardasil: no ? ? reports that she has never smoked. She has never used smokeless tobacco. She reports that she does not currently use alcohol. She reports that she does not use drugs. She works in [employer]. Son is grown, lives with her, working full time.  ? ?Past Medical History:  ?Diagnosis Date  ? Acne   ? Anemia   ? Cough 01/27/2016  ? Depression   ? Diabetes mellitus type 1 ([place])   ? Diabetic neuropathy ([place])   ? feet  ? Diabetic retinopathy ([place])   ? bilateral  ? Dyslipidemia   ? GERD (gastroesophageal reflux disease)   ? Hypertension   ? Infertility, female   ? Post-nasal drip 01/27/2016  ? Shoulder pain, bilateral   ? wakes up frequently due to pain  ? Stuffy nose 01/27/2016  ? Trigger finger of right hand 01/2016  ? index finger  ? ? ?Past Surgical History:  ?Procedure Laterality Date  ? CESAREAN SECTION  01/17/1995  ? INTRAUTERINE DEVICE (IUD) INSERTION  06/2017  ? Mirena   ? SHOULDER SURGERY Left 06/29/2013  ? exc. bone spur  ? TRIGGER FINGER RELEASE Right 02/02/2016  ? Procedure: RIGHT INDEX RELEASE TRIGGER  ;  Surgeon: Leanora Cover, MD;  Location: [facility];  Service: Orthopedics;  Laterality: Right;  ? ? ?Current Outpatient Medications   ?Medication Sig Dispense Refill  ? acetaminophen (TYLENOL) 500 MG tablet Take 1,000 mg by mouth every 6 (six) hours as needed.    ? b complex vitamins tablet Take 1 tablet by mouth daily.    ? cholecalciferol (VITAMIN D) 1000 UNITS tablet Take 1,000 Units by mouth daily.    ? Ferrous Sulfate Dried 45 MG TBCR Take 45 mg by mouth daily.     ? hydrochlorothiazide (HYDRODIURIL) 25 MG tablet Take 25 mg by mouth daily.    ? ibuprofen (ADVIL) 200 MG tablet Take 400 mg by mouth every 6 (six) hours as needed.    ? insulin aspart (NOVOLOG) 100 UNIT/ML injection Inject 90 Units into the skin 3 (three) times daily before meals.    ? Insulin Disposable Pump (OMNIPOD 5 G6 POD, GEN 5,) MISC SMARTSIG:1 SUB-Q Every 3 Days    ? Insulin Human (INSULIN PUMP) SOLN Inject into the skin continuous. Humalog    ? Insulin Infusion Pump Supplies (AUTOSOFT 90 INFUSION SET) MISC CHANGE EVERY 2-3 DAYS    ? Insulin Infusion Pump Supplies (PARADIGM QUICK-SET 32" 6MM) MISC 1 Device by Does not apply route every 3 (three) days.    ? Insulin Infusion Pump Supplies (PARADIGM RESERVOIR 3ML) MISC 1 Device by Does not apply route every  3 (three) days.    ? insulin lispro (HUMALOG) 100 UNIT/ML injection VIA INSULIN PUMP, total of 90 units/day 90 mL 3  ? ipratropium (ATROVENT) 0.03 % nasal spray Place 2 sprays into both nostrils every 8 (eight) hours as needed for rhinitis. Please schedule a follow up appointment for further refills. Thank you 30 mL 1  ? levonorgestrel (MIRENA) 20 MCG/24HR IUD 1 each by Intrauterine route once.    ? omeprazole (PRILOSEC) 20 MG capsule TAKE 1 CAPSULE (20 MG TOTAL) BY MOUTH 2 (TWO) TIMES DAILY. TAKE BEFORE A MEAL 180 capsule 0  ? Pediatric Multivit-Minerals-C (KIDS GUMMY BEAR VITAMINS PO) Take 2 each by mouth daily.    ? ?No current facility-administered medications for this visit.  ? ?Facility-Administered Medications Ordered in Other Visits  ?Medication Dose Route Frequency Provider Last Rate Last Admin  ? heparin lock  flush 100 unit/mL  500 Units Intracatheter Once PRN Brunetta Genera, MD      ? sodium chloride 0.9 % injection 10 mL  10 mL Intracatheter PRN Brunetta Genera, MD      ? ? ?Family History  ?Problem Relation Age of Onset  ? Diabetes Mother   ? Heart attack Mother   ?     Age 63  ? Hypertension Mother   ? Heart attack Father   ?     Age 45  ? Hyperlipidemia Father   ? Diabetes Maternal Uncle   ? Diabetes Maternal Grandmother   ? Colon cancer Neg Hx   ? Esophageal cancer Neg Hx   ? Stomach cancer Neg Hx   ? ? ?Review of Systems  ?All other systems reviewed and are negative. ? ?Exam:   ?BP 124/84 (BP Location: Right Arm, Patient Position: Sitting, Cuff Size: Large)   Pulse 80   Ht '5\' 7"'$  (1.702 m)   Wt 241 lb (109.3 kg)   SpO2 100%   BMI 37.75 kg/m?   Weight change: '@WEIGHTCHANGE'$ @ Height:   Height: '5\' 7"'$  (170.2 cm)  ?Ht Readings from Last 3 Encounters:  ?04/20/22 '5\' 7"'$  (1.702 m)  ?04/01/20 5' 6.75" (1.695 m)  ?04/15/19 '5\' 6"'$  (1.676 m)  ? ? ?General appearance: alert, cooperative and appears stated age ?Head: Normocephalic, without obvious abnormality, atraumatic ?Neck: no adenopathy, supple, symmetrical, trachea midline and thyroid normal to inspection and palpation ?Lungs: clear to auscultation bilaterally ?Cardiovascular: regular rate and rhythm ?Breasts: normal appearance, no masses or tenderness ?Abdomen: soft, non-tender; non distended,  no masses,  no organomegaly ?Extremities: extremities normal, atraumatic, no cyanosis or edema ?Skin: Skin color, texture, turgor normal. No rashes or lesions ?Lymph nodes: Cervical, supraclavicular, and axillary nodes normal. ?No abnormal inguinal nodes palpated ?Neurologic: Grossly normal ? ? ?Pelvic: External genitalia:  no lesions ?             Urethra:  normal appearing urethra with no masses, tenderness or lesions ?             Bartholins and Skenes: normal    ?             Vagina: normal appearing vagina with normal color and discharge, no lesions ?              Cervix: no lesions and IUD strings 3 cm ?              ?Bimanual Exam:  Uterus:   no masses or tenderness ?             Adnexa: no  mass, fullness, tenderness ?              Rectovaginal: Confirms ?              Anus:  normal sphincter tone, no lesions ? ? ?1. Well woman exam ?Discussed breast self exam ?Discussed calcium and vit D intake ?Mammogram UTD ?Labs with primary ? ?2. Screening for cervical cancer ?- Cytology - PAP ? ?3. Colon cancer screening ?- Ambulatory referral to Gastroenterology ? ?4. IUD check up ?Doing well ? ?

## 2022-04-13 ENCOUNTER — Encounter: Payer: Self-pay | Admitting: Hematology

## 2022-04-20 ENCOUNTER — Encounter: Payer: Self-pay | Admitting: Obstetrics and Gynecology

## 2022-04-20 ENCOUNTER — Ambulatory Visit (INDEPENDENT_AMBULATORY_CARE_PROVIDER_SITE_OTHER): Payer: No Typology Code available for payment source | Admitting: Obstetrics and Gynecology

## 2022-04-20 ENCOUNTER — Encounter: Payer: Self-pay | Admitting: Hematology

## 2022-04-20 ENCOUNTER — Other Ambulatory Visit (HOSPITAL_COMMUNITY)
Admission: RE | Admit: 2022-04-20 | Discharge: 2022-04-20 | Disposition: A | Discharge status: Home / Self Care | Payer: No Typology Code available for payment source | Source: Ambulatory Visit | Attending: Obstetrics and Gynecology | Admitting: Obstetrics and Gynecology

## 2022-04-20 VITALS — BP 124/84 | HR 80 | Ht 67.0 in | Wt 241.0 lb

## 2022-04-20 DIAGNOSIS — Z124 Encounter for screening for malignant neoplasm of cervix: Secondary | ICD-10-CM | POA: Diagnosis present

## 2022-04-20 DIAGNOSIS — Z30431 Encounter for routine checking of intrauterine contraceptive device: Secondary | ICD-10-CM

## 2022-04-20 DIAGNOSIS — Z01419 Encounter for gynecological examination (general) (routine) without abnormal findings: Secondary | ICD-10-CM | POA: Diagnosis not present

## 2022-04-20 DIAGNOSIS — Z1211 Encounter for screening for malignant neoplasm of colon: Secondary | ICD-10-CM

## 2022-04-20 NOTE — Patient Instructions (Addendum)
Kegel Exercises ? ?Kegel exercises can help strengthen your pelvic floor muscles. The pelvic floor is a group of muscles that support your rectum, small intestine, and bladder. In females, pelvic floor muscles also help support the uterus. These muscles help you control the flow of urine and stool (feces). ?Kegel exercises are painless and simple. They do not require any equipment. Your provider may suggest Kegel exercises to: ?Improve bladder and bowel control. ?Improve sexual response. ?Improve weak pelvic floor muscles after surgery to remove the uterus (hysterectomy) or after pregnancy, in females. ?Improve weak pelvic floor muscles after prostate gland removal or surgery, in males. ?Kegel exercises involve squeezing your pelvic floor muscles. These are the same muscles you squeeze when you try to stop the flow of urine or keep from passing gas. The exercises can be done while sitting, standing, or lying down, but it is best to vary your position. ?Ask your health care provider which exercises are safe for you. Do exercises exactly as told by your health care provider and adjust them as directed. Do not begin these exercises until told by your health care provider. ?Exercises ?How to do Kegel exercises: ?Squeeze your pelvic floor muscles tight. You should feel a tight lift in your rectal area. If you are a female, you should also feel a tightness in your vaginal area. Keep your stomach, buttocks, and legs relaxed. ?Hold the muscles tight for up to 10 seconds. ?Breathe normally. ?Relax your muscles for up to 10 seconds. ?Repeat as told by your health care provider. ?Repeat this exercise daily as told by your health care provider. Continue to do this exercise for at least 4-6 weeks, or for as long as told by your health care provider. ?You may be referred to a physical therapist who can help you learn more about how to do Kegel exercises. ?Depending on your condition, your health care provider may  recommend: ?Varying how long you squeeze your muscles. ?Doing several sets of exercises every day. ?Doing exercises for several weeks. ?Making Kegel exercises a part of your regular exercise routine. ?This information is not intended to replace advice given to you by your health care provider. Make sure you discuss any questions you have with your health care provider. ?Document Revised: 04/20/2021 Document Reviewed: 04/20/2021 ?Elsevier Patient Education ? [place]. ? ?EXERCISE   We recommended that you start or continue a regular exercise program for good health. Physical activity is anything that gets your body moving, some is better than none. The CDC recommends 150 minutes per week of Moderate-Intensity Aerobic Activity and 2 or more days of Muscle Strengthening Activity. ? ?Benefits of exercise are limitless: helps weight loss/weight maintenance, improves mood and energy, helps with depression and anxiety, improves sleep, tones and strengthens muscles, improves balance, improves bone density, protects from chronic conditions such as heart disease, high blood pressure and diabetes and so much more. ?To learn more visit: WhyNotPoker.uy ? ?DIET: Good nutrition starts with a healthy diet of fruits, vegetables, whole grains, and lean protein sources. Drink plenty of water for hydration. Minimize empty calories, sodium, sweets. For more information about dietary recommendations visit: GeekRegister.com.ee and http://schaefer-mitchell.com/ ? ?ALCOHOL:  Women should limit their alcohol intake to no more than 7 drinks/beers/glasses of wine (combined, not each!) per week. Moderation of alcohol intake to this level decreases your risk of breast cancer and liver damage.  If you are concerned that you may have a problem, or your friends have told you they are concerned about  your drinking, there are many resources to help. A  well-known program that is free, effective, and available to all people all over the nation is Alcoholics Anonymous.  Check out this site to learn more: BlockTaxes.se ? ? ?CALCIUM AND VITAMIN D:  Adequate intake of calcium and Vitamin D are recommended for bone health.  You should be getting between 1000-1200 mg of calcium and 800 units of Vitamin D daily between diet and supplements ? ?PAP SMEARS:  Pap smears, to check for cervical cancer or precancers,  have traditionally been done yearly, scientific advances have shown that most women can have pap smears less often.  However, every woman still should have a physical exam from her gynecologist every year. It will include a breast check, inspection of the vulva and vagina to check for abnormal growths or skin changes, a visual exam of the cervix, and then an exam to evaluate the size and shape of the uterus and ovaries. We will also provide age appropriate advice regarding health maintenance, like when you should have certain vaccines, screening for sexually transmitted diseases, bone density testing, colonoscopy, mammograms, etc.  ? ?MAMMOGRAMS:  All women over 57 years old should have a routine mammogram.  ? ?COLON CANCER SCREENING: Now recommend starting at age 34. At this time colonoscopy is not covered for routine screening until 50. There are take home tests that can be done between 45-49.  ? ?COLONOSCOPY:  Colonoscopy to screen for colon cancer is recommended for all women at age 80.  We know, you hate the idea of the prep.  We agree, BUT, having colon cancer and not knowing it is worse!!  Colon cancer so often starts as a polyp that can be seen and removed at colonscopy, which can quite literally save your life!  And if your first colonoscopy is normal and you have no family history of colon cancer, most women don't have to have it again for 10 years.  Once every ten years, you can do something that may end up saving your life, right?  We will be  happy to help you get it scheduled when you are ready.  Be sure to check your insurance coverage so you understand how much it will cost.  It may be covered as a preventative service at no cost, but you should check your particular policy.   ? ? ? ?Breast Self-Awareness ?Breast self-awareness means being familiar with how your breasts look and feel. It involves checking your breasts regularly and reporting any changes to your health care provider. ?Practicing breast self-awareness is important. A change in your breasts can be a sign of a serious medical problem. Being familiar with how your breasts look and feel allows you to find any problems early, when treatment is more likely to be successful. All women should practice breast self-awareness, including women who have had breast implants. ?How to do a breast self-exam ?One way to learn what is normal for your breasts and whether your breasts are changing is to do a breast self-exam. To do a breast self-exam: ?Look for Changes ? ?Remove all the clothing above your waist. ?Stand in front of a mirror in a room with good lighting. ?Put your hands on your hips. ?Push your hands firmly downward. ?Compare your breasts in the mirror. Look for differences between them (asymmetry), such as: ?Differences in shape. ?Differences in size. ?Puckers, dips, and bumps in one breast and not the other. ?Look at each breast for changes in your skin,  such as: ?Redness. ?Scaly areas. ?Look for changes in your nipples, such as: ?Discharge. ?Bleeding. ?Dimpling. ?Redness. ?A change in position. ?Feel for Changes ?Carefully feel your breasts for lumps and changes. It is best to do this while lying on your back on the floor and again while sitting or standing in the shower or tub with soapy water on your skin. Feel each breast in the following way: ?Place the arm on the side of the breast you are examining above your head. ?Feel your breast with the other hand. ?Start in the nipple area and  make ? inch (2 cm) overlapping circles to feel your breast. Use the pads of your three middle fingers to do this. Apply light pressure, then medium pressure, then firm pressure. The light pressure will allow you to feel the tis

## 2022-04-25 LAB — CYTOLOGY - PAP
Comment: NEGATIVE
Diagnosis: NEGATIVE
High risk HPV: NEGATIVE

## 2022-04-26 ENCOUNTER — Encounter: Payer: Self-pay | Admitting: Hematology

## 2022-09-11 ENCOUNTER — Ambulatory Visit (INDEPENDENT_AMBULATORY_CARE_PROVIDER_SITE_OTHER): Payer: No Typology Code available for payment source

## 2022-09-11 ENCOUNTER — Ambulatory Visit (INDEPENDENT_AMBULATORY_CARE_PROVIDER_SITE_OTHER): Payer: No Typology Code available for payment source | Admitting: Podiatrist

## 2022-09-11 ENCOUNTER — Encounter: Payer: Self-pay | Admitting: Podiatrist

## 2022-09-11 DIAGNOSIS — S92214D Nondisplaced fracture of cuboid bone of right foot, subsequent encounter for fracture with routine healing: Secondary | ICD-10-CM

## 2022-09-11 DIAGNOSIS — M792 Neuralgia and neuritis, unspecified: Secondary | ICD-10-CM | POA: Diagnosis not present

## 2022-09-11 DIAGNOSIS — M79671 Pain in right foot: Secondary | ICD-10-CM

## 2022-09-11 MED ORDER — CYCLOBENZAPRINE HCL 5 MG PO TABS: 5.0000 mg | ORAL_TABLET | Freq: Three times a day (TID) | ORAL | 1 refills | Status: AC | PRN

## 2022-09-11 NOTE — Progress Notes (Signed)
Chief Complaint  Patient presents with   Foot Injury    Patient drop a crate on the right foot, about 6 mths ago, rate of pain 5 out 10, the pain in in the bunion area and lateral side of the foot, having a burning pain, patient when the urgent care care they did X-Rays, [place]: surg shoe,  X-Rays were done today A1c- 8.4 BG- 96      HPI: Patient is 51 y.o. female who presents today for pain on the right foot.  She has pain mostly along the medial aspect of the first metatarsal phalangeal joint and plantarly on the first metatarsal head.  She relates that about 6 months ago she dropped a dog crate on the right foot and sustained a fracture on the lateral part of her foot.  She was told this would heal uneventfully and she at times does have pain in this area but generally speaking the pain is on the medial plantar foot.  She relates some cramping in her foot as well which started after she had the dog crate injury.  She is diabetic and also wonders if some of the discomfort is related to nerve issues from diabetes.  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[place]) 04/01/2020   Morbid obesity ([place]) 04/01/2020   Edema of both lower extremities due to peripheral venous insufficiency 04/01/2020   Screening for breast cancer 02/16/2019   DKA (diabetic ketoacidoses) 10/26/2018   Type 1 diabetes mellitus with other specified complication ([place]) 40/07/6760   AKI (acute kidney injury) ([place]) 10/26/2018   Thrombocytosis 10/26/2018   Fever 10/09/2018   Cough 10/09/2018   Healthcare maintenance 01/09/2018   Poor dentition 01/09/2018   Bronchitis, acute 07/25/2017   Leg wound, right 07/25/2017   Sinusitis, acute maxillary 07/25/2017   Ingrown toenail 06/03/2017   Yeast infection 04/29/2017   BMI 37.0-37.9, adult 12/07/2016   Diabetic neuropathy ([place]) 12/07/2016    Hyperlipemia 12/07/2016   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     b complex vitamins tablet Take 1 tablet by mouth daily.     cholecalciferol (VITAMIN D) 1000 UNITS tablet Take 1,000 Units by mouth daily.     Ferrous Sulfate Dried 45 MG TBCR Take 45 mg by mouth daily.      hydrochlorothiazide (HYDRODIURIL) 25 MG tablet Take 25 mg by mouth daily.     ibuprofen (ADVIL) 200 MG tablet Take 400 mg by mouth every 6 (six) hours as needed.     insulin aspart (NOVOLOG) 100 UNIT/ML injection Inject 90 Units into the skin 3 (three) times daily before meals.     Insulin Disposable Pump (OMNIPOD 5 G6 POD, GEN 5,) MISC SMARTSIG:1 SUB-Q Every 3 Days     Insulin Human (INSULIN PUMP) SOLN Inject into the skin continuous. Humalog     Insulin Infusion  Pump Supplies (AUTOSOFT 90 INFUSION SET) MISC CHANGE EVERY 2-3 DAYS     Insulin Infusion Pump Supplies (PARADIGM QUICK-SET 32" 6MM) MISC 1 Device by Does not apply route every 3 (three) days.     Insulin Infusion Pump Supplies (PARADIGM RESERVOIR 3ML) MISC 1 Device by Does not apply route every 3 (three) days.     insulin lispro (HUMALOG) 100 UNIT/ML injection VIA INSULIN PUMP, total of 90 units/day 90 mL 3   ipratropium (ATROVENT) 0.03 % nasal spray Place 2 sprays into both nostrils every 8 (eight) hours as needed for rhinitis. Please schedule a follow up appointment for further refills. Thank you 30 mL 1    levonorgestrel (MIRENA) 20 MCG/24HR IUD 1 each by Intrauterine route once.     omeprazole (PRILOSEC) 20 MG capsule TAKE 1 CAPSULE (20 MG TOTAL) BY MOUTH 2 (TWO) TIMES DAILY. TAKE BEFORE A MEAL 180 capsule 0   Pediatric Multivit-Minerals-C (KIDS GUMMY BEAR VITAMINS PO) Take 2 each by mouth daily.     Current Facility-Administered Medications on File Prior to Visit  Medication Dose Route Frequency Provider Last Rate Last Admin   heparin lock flush 100 unit/mL  500 Units Intracatheter Once PRN Brunetta Genera, MD       sodium chloride 0.9 % injection 10 mL  10 mL Intracatheter PRN Brunetta Genera, MD        Allergies  Allergen Reactions   Lipitor [Atorvastatin] Swelling    LEGS   Jardiance [Empagliflozin]     Pt stated "It gave me DKA"   Simvastatin Other (See Comments)    Severe leg cramps   Statins     Leg cramps    Review of Systems No fevers, chills, nausea, muscle aches, no difficulty breathing, no calf pain, no chest pain or shortness of breath.   Physical Exam  GENERAL APPEARANCE: Alert, conversant. Appropriately groomed. No acute distress.   VASCULAR: Pedal pulses palpable 2/4 DP and/4 PT bilateral.  Capillary refill time is immediate to all digits,  Proximal to distal cooling it warm to warm.  Digital perfusion adequate.   NEUROLOGIC: sensation is intact to 5.07 monofilament at 5/5 sites bilateral.  Light touch is intact bilateral, vibratory sensation intact bilateral.  Subjective pain and tingling reported along the medial aspect of the first meta tarsal and hallux of the right foot.  MUSCULOSKELETAL: acceptable muscle strength, tone and stability bilateral.  Good range of motion of the first metatarsophalangeal joint is noted.  No specific pain at the dorsal lateral aspect of the right foot where there is a bone chip off of the cuboid on x-ray..  Pes planus foot type is noted.  DERMATOLOGIC: skin is warm, supple, and dry.  Color, texture, and turgor of skin  within normal limits.  No open wounds are noted.  No preulcerative lesions are seen.  Digital nails are asymptomatic.    Reevaluation 3 views of the right foot are obtained.  There is a bone fragment from the cuboid seen best on the AP view.  No other acute osseous abnormalities are seen.  Variant the calcaneocuboid joint is seen on the lateral x-ray.  Right bunion deformity is noted.  Joint space is normal.  Pes planus foot deformity is noted.    Assessment     ICD-10-CM   1. Closed nondisplaced fracture of cuboid of right foot with routine healing, subsequent encounter  S92.214D DG Foot Complete Right    2. Nerve pain  M79.2  Plan  Exam and x-ray findings are discussed with the patient.  The main pain she is having does appear to be nerve pain therefore I recommend nerve V over-the-counter medication as well as Flexeril to take for the cramping.  This was called in for her at today's visit.  We also discussed the x-rays.  The bone fragment on the cuboid does not appear to be in the joint or causing any major pain at today's visit.  Therefore I recommended watching the area.  In the future she may need it removed however I would wait to see if it causes more problems.  She will be seen back in 6 weeks for recheck of the foot to see how she is doing with the Flexeril and her 5.  If any problems or concerns arise prior to that visit she will call.

## 2022-09-11 NOTE — Patient Instructions (Signed)
Try NERVIVE-  its a nerve supplement at any pharmacy  I'll call in Flexeril for you to take for muscle spasms  We will check back in with you in 6 weeks.

## 2022-09-19 ENCOUNTER — Other Ambulatory Visit: Payer: Self-pay | Admitting: Podiatrist

## 2022-09-19 DIAGNOSIS — S92214D Nondisplaced fracture of cuboid bone of right foot, subsequent encounter for fracture with routine healing: Secondary | ICD-10-CM

## 2022-10-23 ENCOUNTER — Ambulatory Visit: Payer: No Typology Code available for payment source | Admitting: Podiatry

## 2024-12-01 ENCOUNTER — Ambulatory Visit

## 2024-12-01 ENCOUNTER — Encounter: Payer: Self-pay | Admitting: Hematology

## 2024-12-01 DIAGNOSIS — E119 Type 2 diabetes mellitus without complications: Secondary | ICD-10-CM

## 2024-12-01 DIAGNOSIS — B353 Tinea pedis: Secondary | ICD-10-CM

## 2024-12-01 DIAGNOSIS — E1069 Type 1 diabetes mellitus with other specified complication: Secondary | ICD-10-CM

## 2024-12-01 DIAGNOSIS — Z9189 Other specified personal risk factors, not elsewhere classified: Secondary | ICD-10-CM

## 2024-12-01 MED ORDER — TERBINAFINE HCL 250 MG PO TABS: 250.0000 mg | ORAL_TABLET | Freq: Every day | ORAL | 0 refills | Status: AC

## 2024-12-01 NOTE — Progress Notes (Signed)
 Subjective:  Patient ID: Cynthia Moody, female    DOB: 03-14-1971,  MRN: [id number]  Chief Complaint  Patient presents with   Foot Ulcer    Rm11 Patient complains of skin peeling and irritation left foot under toes for 2 week/ otc lotions and creams / diabetic A1c 7.5    Discussed the use of AI scribe software for clinical note transcription with the patient, who gave verbal consent to proceed.  History of Present Illness Cynthia Moody is a 53 year old female with diabetes who presents for a diabetic foot check due to concerns about a skin lesion.  She noticed crusty dead skin on her foot that she treated with moisturizing creams. About two weeks ago, after an Epsom salt soak to remove dead skin, she developed a small sore under her toe.  She has diabetic neuropathy with chronic numbness in the balls of both feet. Her feet were previously very sensitive to touch but recently are less sensitive, which she attributes to worsening neuropathy.  She had a prior foot injury from dropping a dog crate on it a couple of years ago and still has intermittent pain in that area, especially after prolonged standing at work.  She denies burning or tingling but has leg cramps. 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     Review of Systems: Negative except as noted in the HPI. Denies N/V/F/Ch.  Past Medical History:  Diagnosis Date   Acne    Anemia    Cough 01/27/2016   Depression    Diabetes mellitus type 1 (HCC)    Diabetic neuropathy (HCC)    feet   Diabetic retinopathy (HCC)    bilateral   Dyslipidemia    GERD (gastroesophageal reflux disease)    Hypertension    Infertility, female    Post-nasal drip 01/27/2016   Shoulder pain, bilateral    wakes up frequently due to pain   Stuffy nose 01/27/2016   Trigger finger of right hand 01/2016   index finger    Current Outpatient Medications:    terbinafine  (LAMISIL ) 250 MG tablet, Take 1 tablet (250 mg total)  by mouth daily., Disp: 30 tablet, Rfl: 0   acetaminophen  (TYLENOL ) 500 MG tablet, Take 1,000 mg by mouth every 6 (six) hours as needed., Disp: , Rfl:    b complex vitamins tablet, Take 1 tablet by mouth daily., Disp: , Rfl:    cholecalciferol  (VITAMIN D ) 1000 UNITS tablet, Take 1,000 Units by mouth daily., Disp: , Rfl:    cyclobenzaprine  (FLEXERIL ) 5 MG tablet, Take 1 tablet (5 mg total) by mouth 3 (three) times daily as needed for muscle spasms. May take less.  Start at 1 tablet at night, Disp: 30 tablet, Rfl: 1   Ferrous Sulfate Dried 45 MG TBCR, Take 45 mg by mouth daily. , Disp: , Rfl:    hydrochlorothiazide (HYDRODIURIL) 25 MG tablet, Take 25 mg by mouth daily., Disp: , Rfl:    ibuprofen (ADVIL) 200 MG tablet, Take 400 mg by mouth every 6 (six) hours as needed., Disp: , Rfl:    insulin  aspart (NOVOLOG ) 100 UNIT/ML injection, Inject 90 Units into the skin 3 (three) times daily before meals., Disp: , Rfl:    Insulin  Disposable Pump (OMNIPOD 5 G6 POD, GEN 5,) MISC, SMARTSIG:1 SUB-Q Every 3 Days, Disp: , Rfl:    Insulin  Human (INSULIN  PUMP) SOLN, Inject into the skin continuous. Humalog , Disp: , Rfl:    Insulin  Infusion  Pump Supplies (AUTOSOFT 90 INFUSION SET) MISC, CHANGE EVERY 2-3 DAYS, Disp: , Rfl:    Insulin  Infusion Pump Supplies (PARADIGM QUICK-SET 32 ) MISC, 1 Device by Does not apply route every 3 (three) days., Disp: , Rfl:    Insulin  Infusion Pump Supplies (PARADIGM RESERVOIR ) MISC, 1 Device by Does not apply route every 3 (three) days., Disp: , Rfl:    insulin  lispro (HUMALOG ) 100 UNIT/ML injection, VIA INSULIN  PUMP, total of 90 units/day, Disp: 90 mL, Rfl: 3   ipratropium (ATROVENT ) 0.03 % nasal spray, Place 2 sprays into both nostrils every 8 (eight) hours as needed for rhinitis. Please schedule a follow up appointment for further refills. Thank you, Disp: 30 mL, Rfl: 1   levonorgestrel  (MIRENA ) 20 MCG/24HR IUD, 1 each by Intrauterine route once., Disp: , Rfl:    omeprazole   (PRILOSEC) 20 MG capsule, TAKE 1 CAPSULE (20 MG TOTAL) BY MOUTH 2 (TWO) TIMES DAILY. TAKE BEFORE A MEAL, Disp: 180 capsule, Rfl: 0   Pediatric Multivit-Minerals-C (KIDS GUMMY BEAR VITAMINS PO), Take 2 each by mouth daily., Disp: , Rfl:  No current facility-administered medications for this visit.  Facility-Administered Medications Ordered in Other Visits:    heparin  lock flush 100 unit/mL, 500 Units, Intracatheter, Once PRN, Onesimo, Gautam Kishore, MD   sodium chloride  0.9 % injection 10 mL, 10 mL, Intracatheter, PRN, Onesimo, Gautam Kishore, MD  Social History   Tobacco Use  Smoking Status Never  Smokeless Tobacco Never    Allergies  Allergen Reactions   Lipitor [Atorvastatin] Swelling    LEGS   Jardiance [Empagliflozin]     Pt stated It gave me DKA   Simvastatin Other (See Comments)    Severe leg cramps   Statins     Leg cramps   Objective:   Constitutional Well developed. Well nourished. Oriented to person, place, and time.  Vascular Dorsalis pedis pulses faintly bilaterally. Posterior tibial pulses faintly bilaterally. Capillary refill normal to all digits.  No cyanosis or clubbing noted. Pedal hair growth normal.  Neurologic Normal speech. Epicritic sensation to light touch grossly intact bilaterally. Subjective description of numbness to the ball of her feet Negative tinel sign at tarsal tunnel bilaterally.   Dermatologic Skin texture and turgor are within normal limits.  No open wounds. Peeling and mild erythema at right foot sub second and third metatarsal head. No open wounds or lesions. Small vesicle formation third digit.  Mild hyperkeratotic lesion without central core right hallux IPJ.  Musculoskeletal: 5/5 muscle strength. Mild, flexible pes planus.    Radiographs: Taken and reviewed. 3 weightbearing views of the right foot.  No acute osseous lesions such as fracture or dislocation are identified.  Mild pes planus foot shape with increased talonavicular joint  uncoverage, faulting at the naviculocuneiform joint.  No significant arthritic changes.   Assessment:   1. At moderate risk for ulcer right foot due to diabetes mellitus (HCC)   2. Tinea pedis of right foot   3. Type 1 diabetes mellitus with other specified complication [facility])      Plan:  Patient was evaluated and treated and all questions answered.  Assessment and Plan Assessment & Plan Tinea pedis, right foot Mild vesicular tinea pedis with peeling and erythema. Protective sensation intact despite neuropathy. - Prescribed terbinafine  250 mg once daily for 4 weeks. - Advised against using urea cream on affected area. Use urea on callused skin.  - Instructed to return for evaluation if no improvement after 4 weeks.  At moderate risk  for ulcer right foot due to diabetes mellitus Protective sensation intact. - Advised daily foot inspection for foreign bodies or changes. - Recommended rotating shoes  - Encouraged changing socks daily and avoiding wearing socks at night.    Prentice Ovens, DPM AACFAS Fellowship Trained Podiatric Surgeon [facility]

## 2024-12-29 ENCOUNTER — Ambulatory Visit

## 2024-12-29 DIAGNOSIS — B353 Tinea pedis: Secondary | ICD-10-CM

## 2024-12-29 DIAGNOSIS — E1069 Type 1 diabetes mellitus with other specified complication: Secondary | ICD-10-CM

## 2024-12-29 DIAGNOSIS — Z9189 Other specified personal risk factors, not elsewhere classified: Secondary | ICD-10-CM | POA: Diagnosis not present

## 2024-12-29 DIAGNOSIS — E119 Type 2 diabetes mellitus without complications: Secondary | ICD-10-CM

## 2024-12-29 MED ORDER — TERBINAFINE HCL 250 MG PO TABS: 250.0000 mg | ORAL_TABLET | Freq: Every day | ORAL | 0 refills | Status: AC

## 2024-12-29 NOTE — Progress Notes (Signed)
 "  Subjective:  Patient ID: Cynthia Moody, female    DOB: 09-21-1971,  MRN: [id number]  Chief Complaint  Patient presents with   Tinea Pedis    Rm11 Patient says that she has 95% improvement with treatment and she is doing well.    Discussed the use of AI scribe software for clinical note transcription with the patient, who gave verbal consent to proceed.  History of Present Illness Cynthia Moody is a 54 year old female with type 1 diabetes and diabetic polyneuropathy who presents for follow-up of right foot tinea pedis. She is almost done with 4 weeks of terbinafine .   She notes significant improvement in right foot symptoms with antifungal therapy. She has residual desquamation and small areas of thickened skin without pain, with only mild ticklish sensitivity to touch in specific regions.  She continues to have chronic neuropathic numbness in the right foot, which she attributes to diabetic polyneuropathy and does not expect to change.  She is near completion of her oral antifungal course with about four doses remaining and uses an exfoliating glove to remove dead skin, which she finds beneficial.     Review of Systems: Negative except as noted in the HPI. Denies N/V/F/Ch.  Past Medical History:  Diagnosis Date   Acne    Anemia    Cough 01/27/2016   Depression    Diabetes mellitus type 1 (HCC)    Diabetic neuropathy (HCC)    feet   Diabetic retinopathy (HCC)    bilateral   Dyslipidemia    GERD (gastroesophageal reflux disease)    Hypertension    Infertility, female    Post-nasal drip 01/27/2016   Shoulder pain, bilateral    wakes up frequently due to pain   Stuffy nose 01/27/2016   Trigger finger of right hand 01/2016   index finger   Current Medications[1]  Tobacco Use History[2]  Allergies[3] Objective:   Constitutional Well developed. Well nourished. Oriented to person, place, and time.  Vascular Dorsalis pedis pulses faintly bilaterally. Posterior tibial  pulses faintly bilaterally. Capillary refill normal to all digits.  No cyanosis or clubbing noted. Pedal hair growth normal.  Neurologic Normal speech. Epicritic sensation to light touch grossly intact bilaterally. Subjective description of numbness to the ball of her feet Negative tinel sign at tarsal tunnel bilaterally.   Dermatologic Skin texture and turgor are within normal limits.  No open wounds. Significant improvement in peeling and erythema at right foot sub second and third metatarsal head. No open wounds or lesions. Vesicle resolved. Minimal peeling remaining.  Mild hyperkeratotic lesion without central core right hallux IPJ.  Musculoskeletal: 5/5 muscle strength. Mild, flexible pes planus.        Assessment:   1. At moderate risk for ulcer right foot due to diabetes mellitus (HCC)   2. Tinea pedis of right foot   3. Type 1 diabetes mellitus with other specified complication [facility])      Plan:  Patient was evaluated and treated and all questions answered.  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 Residual desquamation and minor hyperkeratosis present. Risk of recurrence due to diabetes. - Prescribed additional two weeks of antifungal therapy to increase likelihood of full resolution. - Advised disinfection or replacement of footwear; use Lysol if not replacing shoes. - Recommended continued use of exfoliating glove for skin care. - Instructed to contact office if symptoms recur or persist post-therapy. - No routine follow-up  unless new issues arise.     Prentice Ovens, DPM AACFAS Fellowship Trained Podiatric Surgeon [facility]     [1]  Current Outpatient Medications:    acetaminophen  (TYLENOL ) 500 MG tablet, Take 1,000 mg by mouth every 6 (six) hours as needed., Disp: , Rfl:    b complex vitamins tablet, Take 1 tablet by mouth daily., Disp: , Rfl:    cholecalciferol   (VITAMIN D ) 1000 UNITS tablet, Take 1,000 Units by mouth daily., Disp: , Rfl:    cyclobenzaprine  (FLEXERIL ) 5 MG tablet, Take 1 tablet (5 mg total) by mouth 3 (three) times daily as needed for muscle spasms. May take less.  Start at 1 tablet at night, Disp: 30 tablet, Rfl: 1   Ferrous Sulfate Dried 45 MG TBCR, Take 45 mg by mouth daily. , Disp: , Rfl:    hydrochlorothiazide (HYDRODIURIL) 25 MG tablet, Take 25 mg by mouth daily., Disp: , Rfl:    ibuprofen (ADVIL) 200 MG tablet, Take 400 mg by mouth every 6 (six) hours as needed., Disp: , Rfl:    insulin  aspart (NOVOLOG ) 100 UNIT/ML injection, Inject 90 Units into the skin 3 (three) times daily before meals., Disp: , Rfl:    Insulin  Disposable Pump (OMNIPOD 5 G6 POD, GEN 5,) MISC, SMARTSIG:1 SUB-Q Every 3 Days, Disp: , Rfl:    Insulin  Human (INSULIN  PUMP) SOLN, Inject into the skin continuous. Humalog , Disp: , Rfl:    Insulin  Infusion Pump Supplies (AUTOSOFT 90 INFUSION SET) MISC, CHANGE EVERY 2-3 DAYS, Disp: , Rfl:    Insulin  Infusion Pump Supplies (PARADIGM QUICK-SET 32 ) MISC, 1 Device by Does not apply route every 3 (three) days., Disp: , Rfl:    Insulin  Infusion Pump Supplies (PARADIGM RESERVOIR ) MISC, 1 Device by Does not apply route every 3 (three) days., Disp: , Rfl:    insulin  lispro (HUMALOG ) 100 UNIT/ML injection, VIA INSULIN  PUMP, total of 90 units/day, Disp: 90 mL, Rfl: 3   ipratropium (ATROVENT ) 0.03 % nasal spray, Place 2 sprays into both nostrils every 8 (eight) hours as needed for rhinitis. Please schedule a follow up appointment for further refills. Thank you, Disp: 30 mL, Rfl: 1   levonorgestrel  (MIRENA ) 20 MCG/24HR IUD, 1 each by Intrauterine route once., Disp: , Rfl:    omeprazole  (PRILOSEC) 20 MG capsule, TAKE 1 CAPSULE (20 MG TOTAL) BY MOUTH 2 (TWO) TIMES DAILY. TAKE BEFORE A MEAL, Disp: 180 capsule, Rfl: 0   Pediatric Multivit-Minerals-C (KIDS GUMMY BEAR VITAMINS PO), Take 2 each by mouth daily., Disp: , Rfl:     terbinafine  (LAMISIL ) 250 MG tablet, Take 1 tablet (250 mg total) by mouth daily., Disp: 30 tablet, Rfl: 0   terbinafine  (LAMISIL ) 250 MG tablet, Take 1 tablet (250 mg total) by mouth daily for 14 days., Disp: 14 tablet, Rfl: 0 No current facility-administered medications for this visit.  Facility-Administered Medications Ordered in Other Visits:    heparin  lock flush 100 unit/mL, 500 Units, Intracatheter, Once PRN, Onesimo, Emaline Brink, MD   sodium chloride  0.9 % injection 10 mL, 10 mL, Intracatheter, PRN, Onesimo, Gautam Kishore, MD [2]  Social History Tobacco Use  Smoking Status Never  Smokeless Tobacco Never  [3]  Allergies Allergen Reactions   Lipitor [Atorvastatin] Swelling    LEGS   Jardiance [Empagliflozin]     Pt stated It gave me DKA   Simvastatin Other (See Comments)    Severe leg cramps   Statins     Leg cramps   "
